# Patient Record
Sex: Female | Born: 1955
Health system: Southern US, Community
[De-identification: ages and names within clinical notes are randomized; demographics above are authoritative.]

## PROBLEM LIST (undated history)

## (undated) DIAGNOSIS — J329 Chronic sinusitis, unspecified: Secondary | ICD-10-CM

## (undated) DIAGNOSIS — H47321 Drusen of optic disc, right eye: Secondary | ICD-10-CM

## (undated) DIAGNOSIS — H2511 Age-related nuclear cataract, right eye: Secondary | ICD-10-CM

## (undated) DIAGNOSIS — M419 Scoliosis, unspecified: Secondary | ICD-10-CM

## (undated) DIAGNOSIS — E669 Obesity, unspecified: Secondary | ICD-10-CM

## (undated) DIAGNOSIS — Z8774 Personal history of (corrected) congenital malformations of heart and circulatory system: Secondary | ICD-10-CM

## (undated) DIAGNOSIS — K219 Gastro-esophageal reflux disease without esophagitis: Secondary | ICD-10-CM

## (undated) DIAGNOSIS — E039 Hypothyroidism, unspecified: Secondary | ICD-10-CM

## (undated) DIAGNOSIS — H2702 Aphakia, left eye: Secondary | ICD-10-CM

## (undated) DIAGNOSIS — N814 Uterovaginal prolapse, unspecified: Secondary | ICD-10-CM

## (undated) DIAGNOSIS — I350 Nonrheumatic aortic (valve) stenosis: Secondary | ICD-10-CM

## (undated) DIAGNOSIS — H5440 Blindness, one eye, unspecified eye: Secondary | ICD-10-CM

## (undated) DIAGNOSIS — Z952 Presence of prosthetic heart valve: Secondary | ICD-10-CM

## (undated) DIAGNOSIS — Q231 Congenital insufficiency of aortic valve: Secondary | ICD-10-CM

## (undated) DIAGNOSIS — J309 Allergic rhinitis, unspecified: Secondary | ICD-10-CM

## (undated) DIAGNOSIS — M549 Dorsalgia, unspecified: Secondary | ICD-10-CM

## (undated) DIAGNOSIS — Q2381 Bicuspid aortic valve: Secondary | ICD-10-CM

## (undated) DIAGNOSIS — I10 Essential (primary) hypertension: Secondary | ICD-10-CM

## (undated) DIAGNOSIS — R609 Edema, unspecified: Secondary | ICD-10-CM

## (undated) DIAGNOSIS — G8929 Other chronic pain: Secondary | ICD-10-CM

## (undated) DIAGNOSIS — H905 Unspecified sensorineural hearing loss: Secondary | ICD-10-CM

## (undated) DIAGNOSIS — K259 Gastric ulcer, unspecified as acute or chronic, without hemorrhage or perforation: Secondary | ICD-10-CM

## (undated) DIAGNOSIS — E785 Hyperlipidemia, unspecified: Secondary | ICD-10-CM

## (undated) DIAGNOSIS — H5462 Unqualified visual loss, left eye, normal vision right eye: Secondary | ICD-10-CM

## (undated) DIAGNOSIS — F419 Anxiety disorder, unspecified: Secondary | ICD-10-CM

## (undated) DIAGNOSIS — J3089 Other allergic rhinitis: Secondary | ICD-10-CM

## (undated) DIAGNOSIS — H547 Unspecified visual loss: Secondary | ICD-10-CM

## (undated) HISTORY — DX: Bicuspid aortic valve: Q23.81

## (undated) HISTORY — DX: Unspecified sensorineural hearing loss: H90.5

## (undated) HISTORY — DX: Edema, unspecified: R60.9

## (undated) HISTORY — DX: Nonrheumatic aortic (valve) stenosis: I35.0

## (undated) HISTORY — DX: Unqualified visual loss, left eye, normal vision right eye: H54.62

## (undated) HISTORY — DX: Essential (primary) hypertension: I10

## (undated) HISTORY — PX: OTHER SURGICAL HISTORY: SHX169

## (undated) HISTORY — PX: COARCTATION OF AORTA REPAIR: SHX261

## (undated) HISTORY — DX: Gastric ulcer, unspecified as acute or chronic, without hemorrhage or perforation: K25.9

## (undated) HISTORY — PX: BREAST EXCISIONAL BIOPSY: SUR124

## (undated) HISTORY — DX: Anxiety disorder, unspecified: F41.9

## (undated) HISTORY — DX: Unspecified visual loss: H54.7

## (undated) HISTORY — DX: Aphakia, left eye: H27.02

## (undated) HISTORY — DX: Other chronic pain: G89.29

## (undated) HISTORY — DX: Presence of prosthetic heart valve: Z95.2

## (undated) HISTORY — DX: Dorsalgia, unspecified: M54.9

## (undated) HISTORY — DX: Personal history of (corrected) congenital malformations of heart and circulatory system: Z87.74

## (undated) HISTORY — DX: Congenital insufficiency of aortic valve: Q23.1

## (undated) HISTORY — DX: Obesity, unspecified: E66.9

## (undated) HISTORY — DX: Scoliosis, unspecified: M41.9

## (undated) HISTORY — DX: Drusen of optic disc, right eye: H47.321

## (undated) HISTORY — DX: Hypothyroidism, unspecified: E03.9

## (undated) HISTORY — DX: Chronic sinusitis, unspecified: J32.9

## (undated) HISTORY — DX: Age-related nuclear cataract, right eye: H25.11

## (undated) HISTORY — DX: Gastro-esophageal reflux disease without esophagitis: K21.9

## (undated) HISTORY — DX: Other allergic rhinitis: J30.89

## (undated) HISTORY — DX: Uterovaginal prolapse, unspecified: N81.4

## (undated) HISTORY — PX: CARDIAC CATHETERIZATION: SHX172

## (undated) HISTORY — DX: Allergic rhinitis, unspecified: J30.9

## (undated) HISTORY — DX: Hyperlipidemia, unspecified: E78.5

---
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
## 1988-03-18 DIAGNOSIS — K219 Gastro-esophageal reflux disease without esophagitis: Secondary | ICD-10-CM

## 1988-03-18 HISTORY — DX: Gastro-esophageal reflux disease without esophagitis: K21.9

## 1995-03-19 DIAGNOSIS — E785 Hyperlipidemia, unspecified: Secondary | ICD-10-CM

## 1995-03-19 DIAGNOSIS — I10 Essential (primary) hypertension: Secondary | ICD-10-CM

## 1995-03-19 HISTORY — PX: THYROID SURGERY: SHX805

## 1995-03-19 HISTORY — DX: Essential (primary) hypertension: I10

## 1995-03-19 HISTORY — DX: Hyperlipidemia, unspecified: E78.5

## 2013-03-18 DIAGNOSIS — G8929 Other chronic pain: Secondary | ICD-10-CM

## 2013-03-18 DIAGNOSIS — M549 Dorsalgia, unspecified: Secondary | ICD-10-CM

## 2013-03-18 HISTORY — DX: Other chronic pain: G89.29

## 2013-03-18 HISTORY — DX: Dorsalgia, unspecified: M54.9

## 2013-07-17 DIAGNOSIS — E669 Obesity, unspecified: Secondary | ICD-10-CM | POA: Insufficient documentation

## 2013-07-17 DIAGNOSIS — E785 Hyperlipidemia, unspecified: Secondary | ICD-10-CM

## 2013-07-17 DIAGNOSIS — K219 Gastro-esophageal reflux disease without esophagitis: Secondary | ICD-10-CM | POA: Insufficient documentation

## 2013-07-17 DIAGNOSIS — J3089 Other allergic rhinitis: Secondary | ICD-10-CM | POA: Insufficient documentation

## 2013-07-17 DIAGNOSIS — E039 Hypothyroidism, unspecified: Secondary | ICD-10-CM

## 2013-07-17 DIAGNOSIS — E66812 Obesity, class 2: Secondary | ICD-10-CM

## 2013-07-17 DIAGNOSIS — M549 Dorsalgia, unspecified: Secondary | ICD-10-CM | POA: Insufficient documentation

## 2013-07-17 DIAGNOSIS — M419 Scoliosis, unspecified: Secondary | ICD-10-CM | POA: Insufficient documentation

## 2013-07-17 DIAGNOSIS — H547 Unspecified visual loss: Secondary | ICD-10-CM | POA: Insufficient documentation

## 2013-07-17 HISTORY — DX: Hyperlipidemia, unspecified: E78.5

## 2013-07-17 HISTORY — DX: Obesity, class 2: E66.812

## 2013-07-17 HISTORY — DX: Hypothyroidism, unspecified: E03.9

## 2013-07-17 HISTORY — DX: Obesity, unspecified: E66.9

## 2013-10-19 DIAGNOSIS — H47321 Drusen of optic disc, right eye: Secondary | ICD-10-CM | POA: Insufficient documentation

## 2013-10-28 DIAGNOSIS — H905 Unspecified sensorineural hearing loss: Secondary | ICD-10-CM | POA: Insufficient documentation

## 2014-02-20 DIAGNOSIS — J320 Chronic maxillary sinusitis: Secondary | ICD-10-CM | POA: Insufficient documentation

## 2014-06-29 DIAGNOSIS — H2702 Aphakia, left eye: Secondary | ICD-10-CM

## 2014-06-29 DIAGNOSIS — H2511 Age-related nuclear cataract, right eye: Secondary | ICD-10-CM | POA: Insufficient documentation

## 2014-06-29 HISTORY — DX: Aphakia, left eye: H27.02

## 2014-09-14 DIAGNOSIS — N814 Uterovaginal prolapse, unspecified: Secondary | ICD-10-CM | POA: Insufficient documentation

## 2014-09-14 DIAGNOSIS — N812 Incomplete uterovaginal prolapse: Secondary | ICD-10-CM | POA: Insufficient documentation

## 2014-09-14 HISTORY — DX: Uterovaginal prolapse, unspecified: N81.4

## 2014-09-14 HISTORY — DX: Incomplete uterovaginal prolapse: N81.2

## 2015-03-19 HISTORY — PX: AORTIC VALVE REPLACEMENT: SHX41

## 2016-01-01 ENCOUNTER — Encounter: Payer: Self-pay | Admitting: Internal Medicine

## 2016-01-04 ENCOUNTER — Telehealth: Payer: Self-pay | Admitting: Internal Medicine

## 2016-01-04 NOTE — Telephone Encounter (Signed)
Walk In pt form-D[employer] Clearance-dropped off will give to Dr.End Friday 01/05/16

## 2016-01-12 ENCOUNTER — Encounter: Payer: Self-pay | Admitting: Internal Medicine

## 2016-01-12 ENCOUNTER — Encounter (INDEPENDENT_AMBULATORY_CARE_PROVIDER_SITE_OTHER): Payer: Self-pay

## 2016-01-12 ENCOUNTER — Ambulatory Visit (INDEPENDENT_AMBULATORY_CARE_PROVIDER_SITE_OTHER): Payer: Medicare Other | Admitting: Internal Medicine

## 2016-01-12 VITALS — BP 128/84 | HR 82 | Ht <= 58 in | Wt 179.0 lb

## 2016-01-12 DIAGNOSIS — Z8774 Personal history of (corrected) congenital malformations of heart and circulatory system: Secondary | ICD-10-CM

## 2016-01-12 DIAGNOSIS — I10 Essential (primary) hypertension: Secondary | ICD-10-CM

## 2016-01-12 DIAGNOSIS — Z952 Presence of prosthetic heart valve: Secondary | ICD-10-CM | POA: Diagnosis not present

## 2016-01-12 DIAGNOSIS — M7989 Other specified soft tissue disorders: Secondary | ICD-10-CM

## 2016-01-12 DIAGNOSIS — E7800 Pure hypercholesterolemia, unspecified: Secondary | ICD-10-CM

## 2016-01-12 DIAGNOSIS — Z8679 Personal history of other diseases of the circulatory system: Secondary | ICD-10-CM | POA: Diagnosis not present

## 2016-01-12 MED ORDER — FUROSEMIDE 20 MG PO TABS: 20.0000 mg | ORAL_TABLET | Freq: Every day | ORAL | 11 refills | Status: DC

## 2016-01-12 NOTE — Patient Instructions (Addendum)
Medication Instructions:  Your physician has recommended you make the following change in your medication:  START Lasix 20mg  daily. An Rx has been sent to your pharmacy  Labwork: Bmet today  Your physician recommends that you return for lab work in: 1-2 weeks (Bmet) can be done the same day as echo   Testing/Procedures: Your physician has requested that you have an echocardiogram. Echocardiography is a painless test that uses sound waves to create images of your heart. It provides your doctor with information about the size and shape of your heart and how well your heart's chambers and valves are working. This procedure takes approximately one hour. There are no restrictions for this procedure. (To be scheduled in 1-2 weeks)   Follow-Up: Your physician recommends that you schedule a follow-up appointment in: 3 months with Dr.End   Any Other Special Instructions Will Be Listed Below (If Applicable). [facility]  We will [facility]      If you need a refill on your cardiac medications before your next appointment, please call your pharmacy.

## 2016-01-12 NOTE — Progress Notes (Signed)
New Outpatient Visit Date: 01/12/2016  Referring Provider: None  Chief Complaint: Establish heart care in [place]  HPI:  Ashley Torres is a 60 y.o. year-old female with history of bicuspid aortic valve status post replacement in 03/2015, coarctation of the aorta status post repair as a child, hypertension, hyperlipidemia, and congenital deafness, who presents to establish cardiology care after recently moving to [place] from [place]. The history is obtained with the assistance of a sign language interpreter. The patient does not recall some of the specifics surrounding her aortic valve replacement in January. However, it appears that she had been experiencing progressive fatigue leading up to the procedure. She underwent aortic valve replacement with a bioprosthesis, noting that the surgery was much longer than expected. Her perioperative course was complicated by a GI bleed, though she has otherwise recovered well. She was participating in cardiac rehabilitation prior to her move to [place], but was unable to finish the program.  The patient notes occasional positional chest pain that she attributes to healing of her sternotomy. She also has intermittent indigestion and heartburn. She denies exertional chest pain/pressure. She has not felt short of breath. She has not noticed significant leg swelling either. She notes some stuffiness in her sinuses that she attributes to allergies, which prompts her to elevate her head at night when going to bed. In the past, she has periods palpitations, though these have not been present since her aortic valve surgery.  Records of her prior cardiovascular care are limited. However, pre-AVR left heart catheterization reportedly showed clean coronary arteries.  --------------------------------------------------------------------------------------------------  Cardiovascular History & Procedures: Cardiovascular Problems:  Congenitally  malformed aortic valve status post bioprosthetic aortic valve replacement   Coarctation of the aorta status post repair as a child  Risk Factors:  Hypertension and hyperlipidemia  Cath/PCI:  Left heart catheterization (02/27/15 at [facility]): LMCA normal. LAD normal. LCx normal. RCA normal.  CV Surgery:  Aortic valve replacement with annulus enlargement (19 mm Magna model 300 Carpentier-Edwards bovine pericardial heart valve) on 04/17/15 at [facility]  Coarctation of aorta repair (~60 years of age)  EP Procedures and Devices:  None  Non-Invasive Evaluation(s):  Transthoracic echocardiogram (02/07/15): Normal LV size with mild LVH and mild focal basal hypertrophy of the septum. LVEF 60-65%. Aortic valve with moderate annular calcification and bicuspid morphology. Valve is severely thickened and calcified with severe stenosis (mean gradient 40 mmHg, aortic valve area 0.9 cm). Mild mitral annular calcification and valvular thickening. Mild left atrial enlargement. Normal RV size and function.  Recent CV Pertinent Labs: See below.  --------------------------------------------------------------------------------------------------  Past Medical History:  Diagnosis Date  . Aphakia of left eye   . Back pain   . Chronic sinusitis   . Deafness congenital   . Drusen of right optic disc   . Environmental and seasonal allergies   . Gastric stress ulcer   . GERD (gastroesophageal reflux disease)   . Hyperlipidemia   . Hypertension   . Hypothyroidism   . Nuclear sclerosis of right eye   . Obesity   . Rubella syndrome, congenital   . S/P AVR (aortic valve replacement)   . Scoliosis   . Uterine prolapse   . Vision impairment   . Vision loss, left eye     Past Surgical History:  Procedure Laterality Date  . AORTIC VALVE REPLACEMENT    . CARDIAC CATHETERIZATION    . COARCTATION OF AORTA REPAIR  Outpatient Encounter  Prescriptions as of 01/12/2016  Medication Sig  . acetaminophen (TYLENOL) 500 MG tablet Take 1,000 mg by mouth every 6 (six) hours as needed for moderate pain.  Marland Kitchen albuterol (PROVENTIL HFA) 108 (90 Base) MCG/ACT inhaler Inhale 1 puff into the lungs every 4 (four) hours as needed for wheezing.  Marland Kitchen aspirin EC 81 MG tablet Take 81 mg by mouth daily.  . Calcium Carb-Cholecalciferol [phone] MG-UNIT CAPS Take 2 tablets by mouth daily.  . calcium carbonate (TUMS - DOSED IN MG ELEMENTAL CALCIUM) 500 MG chewable tablet Chew 1 tablet by mouth 4 (four) times daily.  . chlorpheniramine (CHLOR-TRIMETON) 4 MG tablet Take 4 mg by mouth as needed for allergies.  Marland Kitchen FOLIC ACID PO Take 1 tablet by mouth daily.  Marland Kitchen GLYCERIN-HYPROMELLOSE-PEG 400 OP Apply 1 drop to eye as needed (Dry eyes).  . hydrochlorothiazide (HYDRODIURIL) 25 MG tablet Take 25 mg by mouth daily.  Marland Kitchen levothyroxine (SYNTHROID, LEVOTHROID) 112 MCG tablet Take 112 mcg by mouth daily before breakfast.  . lisinopril (PRINIVIL,ZESTRIL) 20 MG tablet Take 20 mg by mouth daily.  . vitamin C (ASCORBIC ACID) 250 MG tablet Take 250 mg by mouth daily.  . Vitamin E 100 units TABS Take 1 tablet by mouth daily.  . Zinc 50 MG TABS Take 1 tablet by mouth daily.  . [DISCONTINUED] amoxicillin-clavulanate (AUGMENTIN) 875-125 MG tablet Take 1 tablet by mouth 2 (two) times daily. FOR 110 DAYS  . [DISCONTINUED] atorvastatin (LIPITOR) 40 MG tablet Take 40 mg by mouth daily.  . [DISCONTINUED] cetirizine (ZYRTEC) 10 MG tablet Take 10 mg by mouth daily.  . [DISCONTINUED] metoprolol succinate (TOPROL-XL) 50 MG 24 hr tablet Take 50 mg by mouth daily. Take with or immediately following a meal.  . [DISCONTINUED] omeprazole (PRILOSEC) 40 MG capsule Take 40 mg by mouth 2 (two) times daily.   No facility-administered encounter medications on file as of 01/12/2016.     Allergies: Statins  Social History   Social History  . Marital status: Married    Spouse name: N/A  .  Number of children: N/A  . Years of education: N/A   Occupational History  . Not on file.   Social History Main Topics  . Smoking status: Never Smoker  . Smokeless tobacco: Never Used  . Alcohol use No  . Drug use: No  . Sexual activity: Not on file   Other Topics Concern  . Not on file   Social History Narrative  . No narrative on file    Family History  Problem Relation Age of Onset  . Heart disease Neg Hx     Review of Systems: Review of Systems  Constitutional: Negative.   HENT: Positive for hearing loss (deaf since birth).   Eyes: Negative.        Blind in one eye since birth.  Respiratory: Positive for cough.   Gastrointestinal: Negative.   Genitourinary: Negative.   Musculoskeletal: Positive for back pain (Scoliosis) and myalgias (When walking).  Skin: Negative.   Neurological: Positive for headaches.  Endo/Heme/Allergies: Bruises/bleeds easily.  Psychiatric/Behavioral: The patient is nervous/anxious (Rare).    --------------------------------------------------------------------------------------------------  Physical Exam: BP 128/84   Pulse 82   Ht 4' 8.5" (1.435 m)   Wt 179 lb (81.2 kg)   LMP  (LMP Unknown)   BMI 39.42 kg/m   General:  Obese woman, seated comfortably in the exam room. She is accompanied by a sign language interpreter. HEENT: No conjunctival pallor or scleral icterus.  Moist mucous  membranes.  OP clear. Neck: Supple without lymphadenopathy or thyromegaly. JVP is approximately 10-12 cm with positive HJR. No carotid bruit. Lungs: Normal work of breathing.  Mildly diminished breath sounds at both bases without wheezes or crackles.. Heart: Regular rate and rhythm without murmurs, rubs, or gallops.  Non-displaced PMI. Abd: Bowel sounds present.  Soft, NT/ND without hepatosplenomegaly Ext: No lower extremity edema.  Radial, PT, and DP pulses are 2+ bilaterally Skin: warm and dry without rash Neuro: CNIII-XII intact.  Strength and  fine-touch sensation intact in upper and lower extremities bilaterally. Psych: Normal mood and affect.  EKG:  Normal sinus rhythm with right bundle branch block.  Outside labs: BMP (10/03/15): Sodium 142, potassium 4.3, chloride 102, CO2 29, BUN 27, creatinine 0.75, glucose 82, calcium 9.9  CBC (05/18/15): But blood cell count 10.7, hemoglobin 10.9, hematocrit 33.8, platelets 308  Lipid panel (10/03/15): Total cholesterol 245, triglycerides 162, HDL 51, LDL 162  --------------------------------------------------------------------------------------------------  ASSESSMENT AND PLAN: 1. Congenitally malformed aortic valve status post aortic valve repair The patient is now 10 months out from her bioprosthetic aortic valve repair. She does not have any symptoms of valvular incompetence, though she does appear modestly volume overloaded on exam today. I encouraged her to continue taking low-dose aspirin. We will perform a transthoracic echocardiogram for further evaluation.  2. Leg swelling The patient denies significant leg swelling, though significant edema is evident on exam today and both legs. She also has elevated JVP suggesting increased right heart pressures. We have agreed to start furosemide 20 mg daily. We will check a basic metabolic panel today to ensure her renal function and electrolytes are normal, and repeat this when she returns for her echo in 1 to 2 weeks. As above, we will perform a transthoracic echocardiogram for further evaluation of potential structural heart problems leading to her leg swelling.  3. Hypertension and history of coarctation of the aorta The patient's blood pressure is well controlled today. We will continue her current regimen. She has good pedal pulses on exam today, making it unlikely that she has significant stenosis in the region of her repaired coarctation. Hopefully, the transthoracic echocardiogram will also allow us to evaluate for evidence of any  significant stenosis involving the descending aorta.  4. Hyperlipidemia Recent lipid panel in [place] notable for an LDL of 162 with normal HDL. Patient has been intolerant of statins in the past. We will have her reenroll in cardiac rehabilitation, she did not complete the entire program following AVR, and encourage lifestyle modification.  Follow-up: Return to clinic in 3 months.  Ashley Kendallhristopher Navina Wohlers, MD 01/12/2016 2:45 PM

## 2016-01-13 DIAGNOSIS — I10 Essential (primary) hypertension: Secondary | ICD-10-CM | POA: Insufficient documentation

## 2016-01-13 DIAGNOSIS — E785 Hyperlipidemia, unspecified: Secondary | ICD-10-CM | POA: Insufficient documentation

## 2016-01-13 DIAGNOSIS — Z8774 Personal history of (corrected) congenital malformations of heart and circulatory system: Secondary | ICD-10-CM

## 2016-01-13 DIAGNOSIS — Z952 Presence of prosthetic heart valve: Secondary | ICD-10-CM

## 2016-01-13 DIAGNOSIS — M7989 Other specified soft tissue disorders: Secondary | ICD-10-CM | POA: Insufficient documentation

## 2016-01-13 HISTORY — DX: Presence of prosthetic heart valve: Z95.2

## 2016-01-13 HISTORY — DX: Essential (primary) hypertension: I10

## 2016-01-13 HISTORY — DX: Personal history of (corrected) congenital malformations of heart and circulatory system: Z87.74

## 2016-01-13 LAB — BASIC METABOLIC PANEL
BUN: 25 mg/dL (ref 7–25)
CHLORIDE: 103 mmol/L (ref 98–110)
CO2: 20 mmol/L (ref 20–31)
Calcium: 9.8 mg/dL (ref 8.6–10.4)
Creat: 0.9 mg/dL (ref 0.50–1.05)
GLUCOSE: 77 mg/dL (ref 65–99)
POTASSIUM: 4.8 mmol/L (ref 3.5–5.3)
Sodium: 138 mmol/L (ref 135–146)

## 2016-01-22 ENCOUNTER — Other Ambulatory Visit: Payer: Self-pay | Admitting: Internal Medicine

## 2016-01-22 DIAGNOSIS — Z1231 Encounter for screening mammogram for malignant neoplasm of breast: Secondary | ICD-10-CM

## 2016-01-30 ENCOUNTER — Telehealth: Payer: Self-pay | Admitting: Internal Medicine

## 2016-01-30 NOTE — Telephone Encounter (Deleted)
error 

## 2016-02-02 ENCOUNTER — Telehealth: Payer: Self-pay | Admitting: Internal Medicine

## 2016-02-02 ENCOUNTER — Other Ambulatory Visit: Payer: Self-pay

## 2016-02-02 ENCOUNTER — Other Ambulatory Visit: Payer: Medicare Other | Admitting: *Deleted

## 2016-02-02 ENCOUNTER — Ambulatory Visit (HOSPITAL_COMMUNITY): Payer: Medicare Other | Attending: Internal Medicine

## 2016-02-02 DIAGNOSIS — I371 Nonrheumatic pulmonary valve insufficiency: Secondary | ICD-10-CM | POA: Insufficient documentation

## 2016-02-02 DIAGNOSIS — M7989 Other specified soft tissue disorders: Secondary | ICD-10-CM | POA: Insufficient documentation

## 2016-02-02 DIAGNOSIS — I071 Rheumatic tricuspid insufficiency: Secondary | ICD-10-CM | POA: Insufficient documentation

## 2016-02-02 DIAGNOSIS — E785 Hyperlipidemia, unspecified: Secondary | ICD-10-CM | POA: Insufficient documentation

## 2016-02-02 DIAGNOSIS — I10 Essential (primary) hypertension: Secondary | ICD-10-CM | POA: Diagnosis not present

## 2016-02-02 DIAGNOSIS — E669 Obesity, unspecified: Secondary | ICD-10-CM | POA: Insufficient documentation

## 2016-02-02 DIAGNOSIS — Z952 Presence of prosthetic heart valve: Secondary | ICD-10-CM | POA: Diagnosis present

## 2016-02-05 ENCOUNTER — Telehealth: Payer: Self-pay | Admitting: Internal Medicine

## 2016-02-05 NOTE — Telephone Encounter (Signed)
[facility] [place] in [facility] Prep. Patient has Appointment on 04/19/16 with Cristal Deerhristopher End,MD

## 2016-02-05 NOTE — Telephone Encounter (Signed)
Please let Ms. Navejas know that I am not familiar with GRS Ultra cell defense and cannot recommend this medication.  However, based on our recent visit, I feel that furosemide would be beneficial for her.  Thanks.

## 2016-02-05 NOTE — Telephone Encounter (Signed)
Message left through interpreter [phone] for pt to call back.

## 2016-02-05 NOTE — Telephone Encounter (Signed)
I spoke with patient through interpreter [phone]. Pt given Dr Serita KyleEnd's recommendation about starting lasix.  Pt states she is unsure when she will be able to get lasix prescription from the pharmacy. Pt is aware that she needs BMET 1-2 weeks after she starts lasix.  I offered to schedule follow up lab for 02/15/16 but pt states she is unable to talk at this time.  I asked pt to call when she started lasix so we could make arrangements for follow up BMET in 1-2 weeks.  Pt also given Dr Serita KyleEnd's comments about GRS Ultra cell defense OTC.

## 2016-02-05 NOTE — Telephone Encounter (Signed)
I will forward to Dr End for review.  

## 2016-02-05 NOTE — Telephone Encounter (Signed)
Mrs. Joneen BoersDepoorter is returning a call .  Thanks

## 2016-02-05 NOTE — Telephone Encounter (Signed)
-----   Message from FernleyGlenna L Chriscoe sent at 02/02/2016  2:42 PM EST ----- Regarding: lab work Pt came in for labs and Echo.  She stated she did not start taking Lasix because of the health risk.  After speaking to LenoxKeith, he stated she did not need lab since she did not start this medication.  Please let Dr. Okey DupreEnd know this and the patient wants to know if it is safe for her to take GRS Ultra cell defense OTC.  Pt is having her Echo as scheduled.

## 2016-02-06 ENCOUNTER — Telehealth: Payer: Self-pay | Admitting: Internal Medicine

## 2016-02-06 ENCOUNTER — Other Ambulatory Visit: Payer: Self-pay | Admitting: *Deleted

## 2016-02-06 MED ORDER — AMOXICILLIN 500 MG PO TABS: ORAL_TABLET | ORAL | 6 refills | Status: DC

## 2016-02-06 NOTE — Telephone Encounter (Signed)
Spoke with pt through interpreter [phone], confirmed pt is not allergic to amoxicillin.

## 2016-02-06 NOTE — Telephone Encounter (Signed)
New message ° °Pt is returning call  ° °Please call back °

## 2016-02-22 ENCOUNTER — Encounter: Payer: Self-pay | Admitting: Family Medicine

## 2016-02-23 ENCOUNTER — Ambulatory Visit
Admission: RE | Admit: 2016-02-23 | Discharge: 2016-02-23 | Disposition: A | Discharge status: Home / Self Care | Payer: Medicare Other | Source: Ambulatory Visit | Attending: Internal Medicine | Admitting: Internal Medicine

## 2016-02-23 ENCOUNTER — Encounter: Payer: Self-pay | Admitting: Radiology

## 2016-02-23 DIAGNOSIS — Z1231 Encounter for screening mammogram for malignant neoplasm of breast: Secondary | ICD-10-CM

## 2016-02-23 IMAGING — MG 2D DIGITAL SCREENING BILATERAL MAMMOGRAM WITH CAD AND ADJUNCT TO
8 of 12 series · 8 of 28 positions shown · non-contrast
Comparison: Previous exam(s).

CLINICAL DATA: Screening.

EXAM:
2D DIGITAL SCREENING BILATERAL MAMMOGRAM WITH CAD AND ADJUNCT TOMO

[R CC synth-2D]
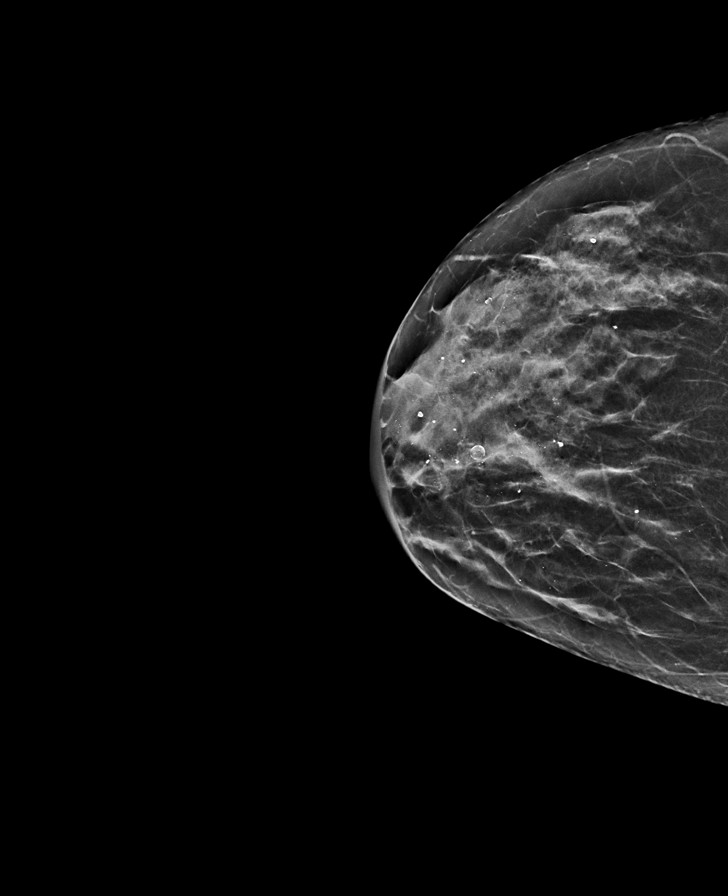

[R MLO synth-2D]
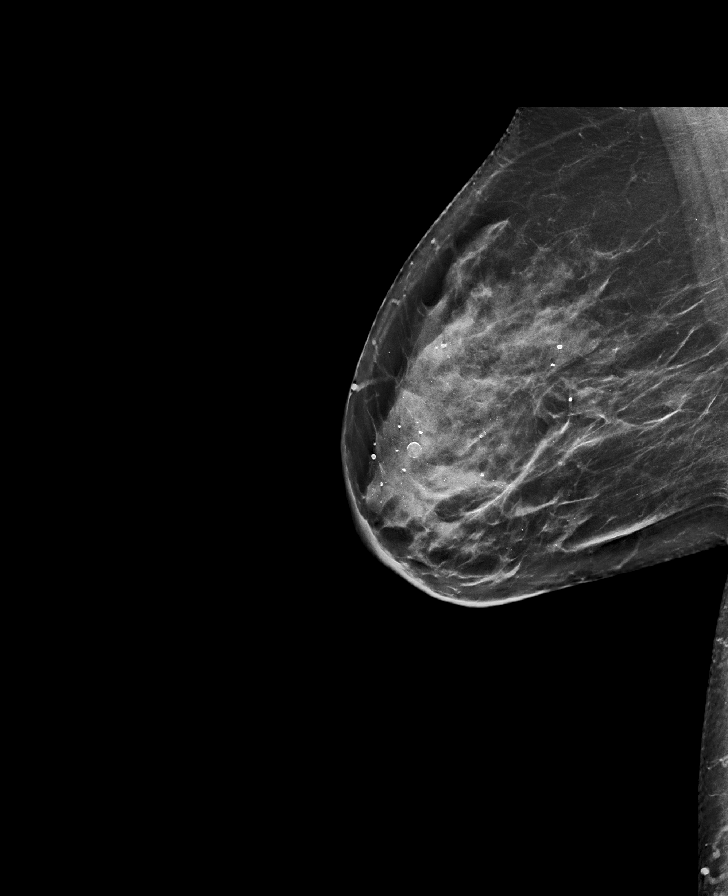

[R CC]
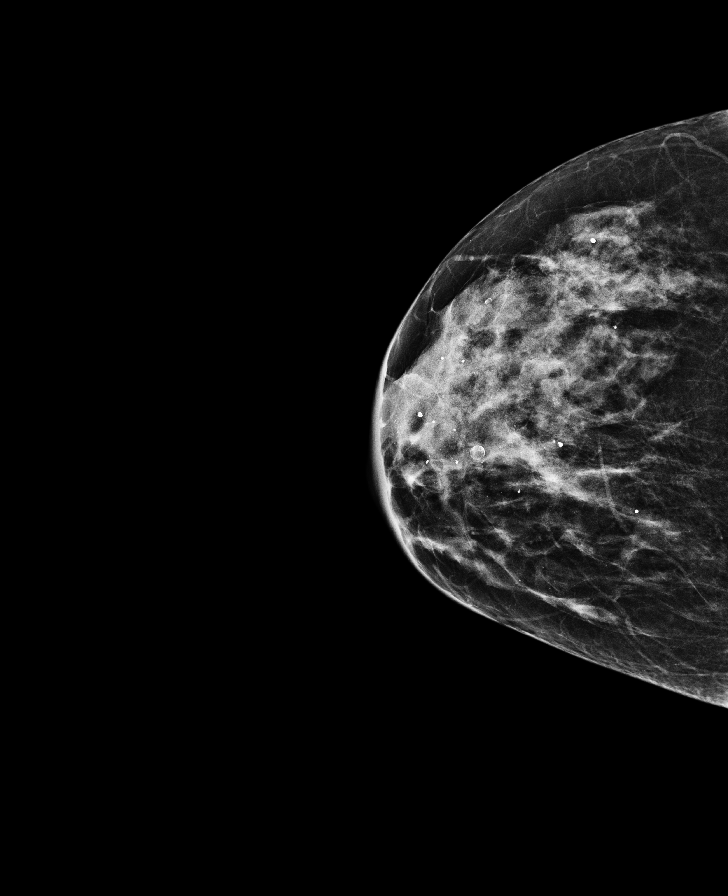

[L CC synth-2D]
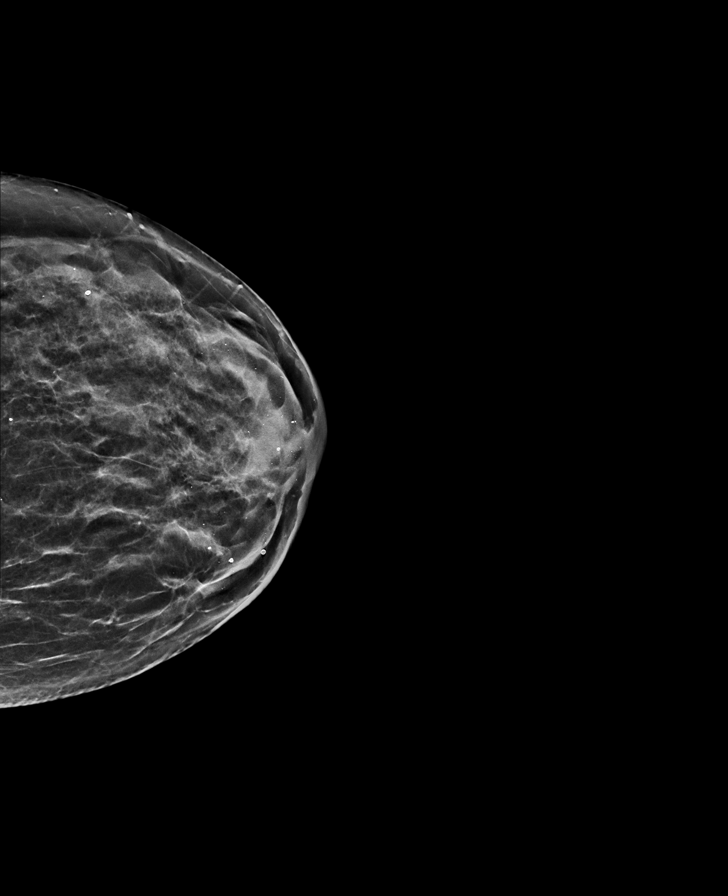

[L MLO synth-2D]
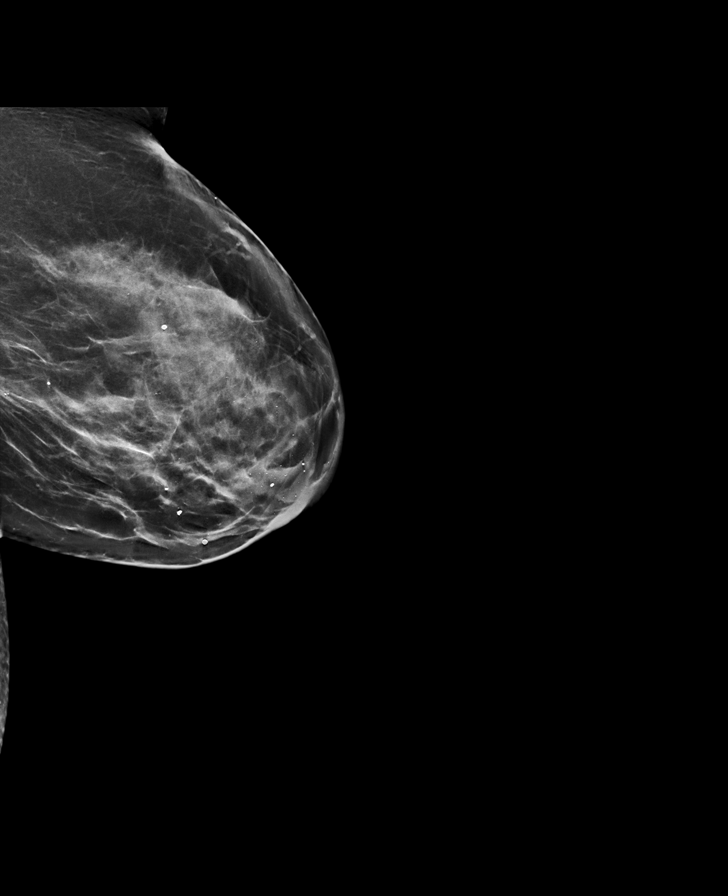

[L MLO]
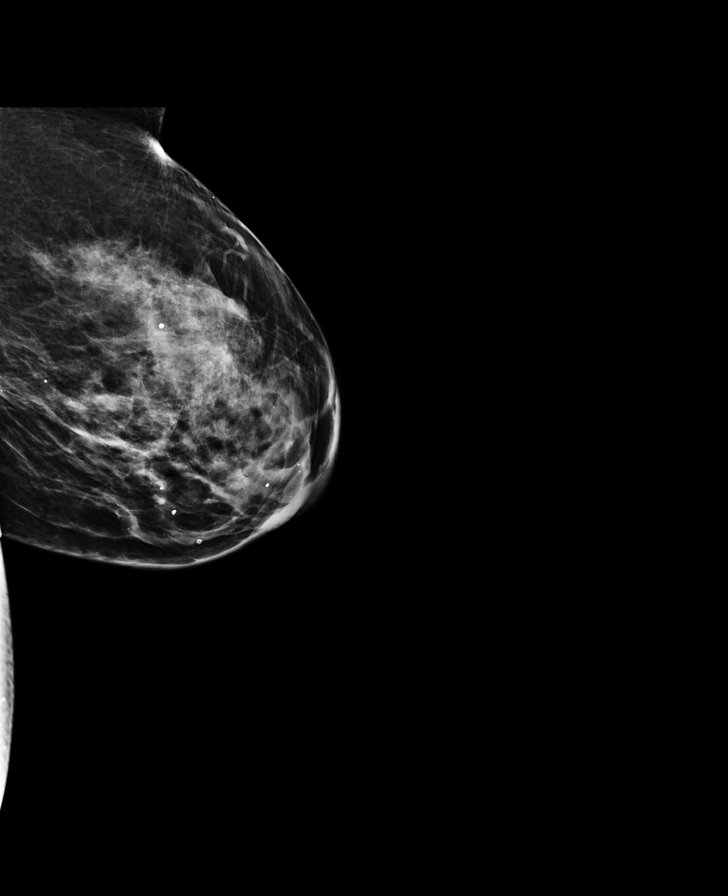

[R MLO]
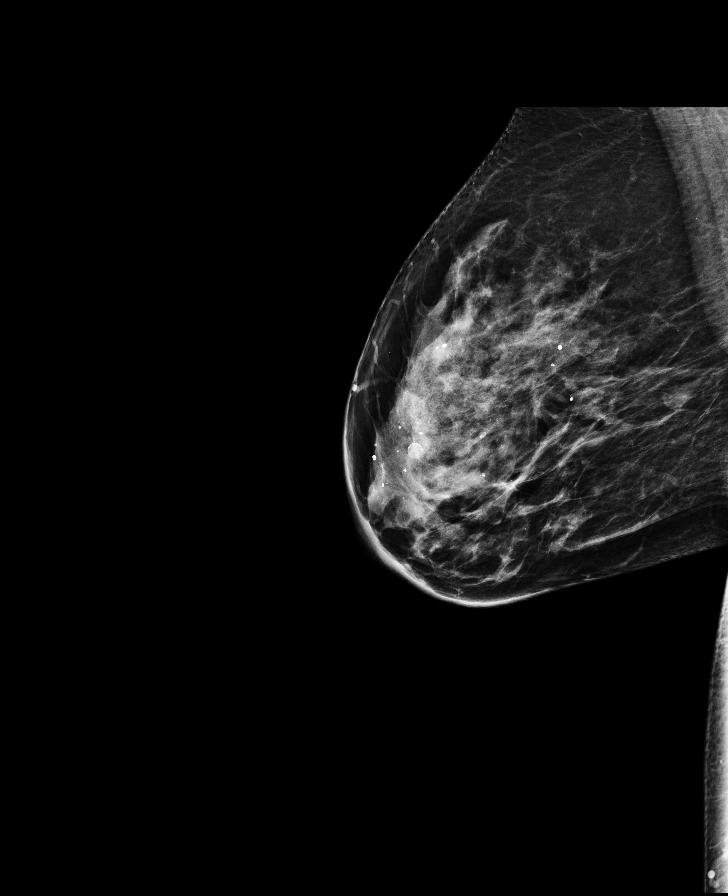

[L CC]
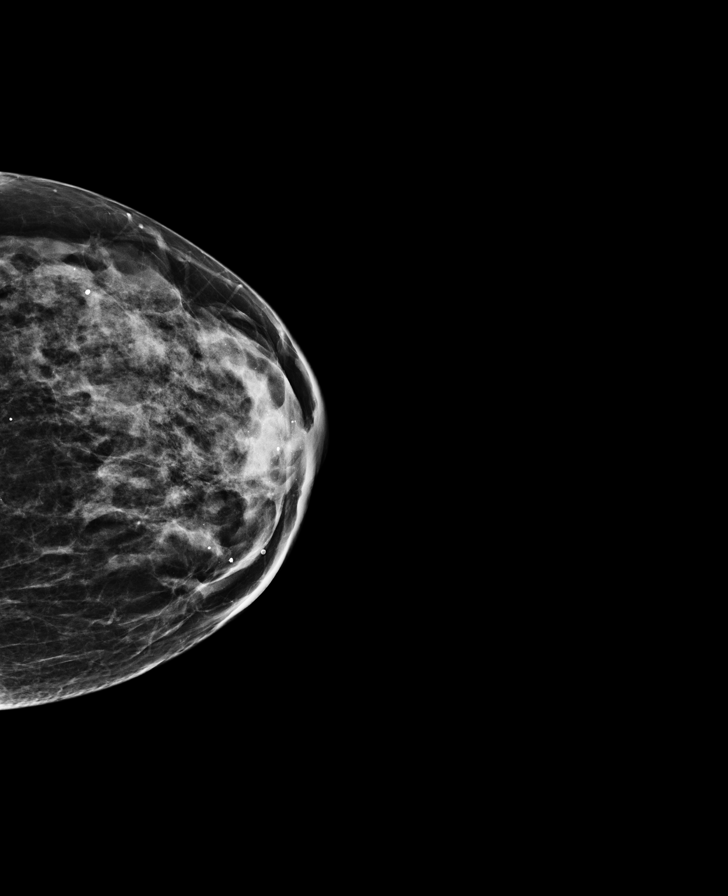

[8 of 28 positions shown; findings below may reference images not displayed]

ACR Breast Density Category c: The breast tissue is heterogeneously
dense, which may obscure small masses.
FINDINGS: In the right breast, possible distortion warrants further
evaluation. In the left breast, no findings suspicious for
malignancy. Images were processed with CAD.
IMPRESSION: Further evaluation is suggested for possible distortion in the right
breast.

RECOMMENDATION:
Diagnostic mammogram and possibly ultrasound of the right breast.
(Code:[58])

The patient will be contacted regarding the findings, and additional
imaging will be scheduled.

BI-RADS CATEGORY  0: Incomplete. Need additional imaging evaluation
and/or prior mammograms for comparison.

## 2016-03-05 ENCOUNTER — Other Ambulatory Visit: Payer: Self-pay | Admitting: Internal Medicine

## 2016-03-05 DIAGNOSIS — R928 Other abnormal and inconclusive findings on diagnostic imaging of breast: Secondary | ICD-10-CM

## 2016-03-14 ENCOUNTER — Encounter: Payer: Self-pay | Admitting: Family Medicine

## 2016-03-14 ENCOUNTER — Ambulatory Visit (INDEPENDENT_AMBULATORY_CARE_PROVIDER_SITE_OTHER): Payer: Medicare Other | Admitting: Family Medicine

## 2016-03-14 ENCOUNTER — Other Ambulatory Visit: Payer: Self-pay | Admitting: Family Medicine

## 2016-03-14 VITALS — BP 116/62 | HR 88 | Temp 97.8°F | Ht <= 58 in | Wt 182.4 lb

## 2016-03-14 DIAGNOSIS — J3089 Other allergic rhinitis: Secondary | ICD-10-CM | POA: Diagnosis present

## 2016-03-14 DIAGNOSIS — R922 Inconclusive mammogram: Secondary | ICD-10-CM

## 2016-03-14 DIAGNOSIS — E785 Hyperlipidemia, unspecified: Secondary | ICD-10-CM

## 2016-03-14 DIAGNOSIS — I10 Essential (primary) hypertension: Secondary | ICD-10-CM | POA: Diagnosis not present

## 2016-03-14 DIAGNOSIS — E039 Hypothyroidism, unspecified: Secondary | ICD-10-CM

## 2016-03-14 DIAGNOSIS — Z952 Presence of prosthetic heart valve: Secondary | ICD-10-CM | POA: Diagnosis not present

## 2016-03-14 LAB — TSH: TSH: 0.87 m[IU]/L

## 2016-03-14 MED ORDER — LEVOTHYROXINE SODIUM 112 MCG PO TABS: 112.0000 ug | ORAL_TABLET | Freq: Every day | ORAL | 3 refills | Status: DC

## 2016-03-14 MED ORDER — ALBUTEROL SULFATE HFA 108 (90 BASE) MCG/ACT IN AERS: 1.0000 | INHALATION_SPRAY | RESPIRATORY_TRACT | 2 refills | Status: DC | PRN

## 2016-03-14 MED ORDER — RED YEAST RICE 600 MG PO CAPS: 1.0000 | ORAL_CAPSULE | Freq: Every day | ORAL | Status: DC

## 2016-03-14 MED ORDER — HYDROCHLOROTHIAZIDE 25 MG PO TABS: 25.0000 mg | ORAL_TABLET | Freq: Every day | ORAL | 3 refills | Status: DC

## 2016-03-14 MED ORDER — LISINOPRIL 20 MG PO TABS: 20.0000 mg | ORAL_TABLET | Freq: Every day | ORAL | 3 refills | Status: DC

## 2016-03-14 NOTE — Assessment & Plan Note (Signed)
Continue red yeast rice. LDL in July 2017 was 162. Discussed healthy diet today and will recheck in 6 months.

## 2016-03-14 NOTE — Assessment & Plan Note (Signed)
Continue rhinocort nasal spray and OTC chlorpheniramine. Of note, patient has h/o chronic sinusitis so managing environmental triggers may be important for her.

## 2016-03-14 NOTE — Assessment & Plan Note (Signed)
Followed by cardiology Dr. Okey DupreEnd, has signs of fluid overload but since opted to not start lasix and refuses compression stockings continue current management per cardiology. Appears stable today.

## 2016-03-14 NOTE — Patient Instructions (Addendum)
It was good to meet you today  For your hypothyroidism - Please continue taking your thyroid medication - We need to check a TSH level today to monitor how your thyroid is working.  We are checking some labs today, and someone will call you or send you a letter with the results when they are available.   Take care and seek immediate care sooner if you develop any concerns.   Dr. Leland HerElsia J Lavine Hargrove, DO [facility]

## 2016-03-14 NOTE — Assessment & Plan Note (Signed)
Continue HCTZ and lisinopril since patient is at goal. No need to recheck BMP at this time since patient did not start lasix.

## 2016-03-14 NOTE — Progress Notes (Signed)
    Subjective:  Toniann FailDawn Yeargan is a 60 y.o. female who [facility] to establish as new patient.  HPI: [facility], recently moved to area. History taken via sign language interpreter.  H/o biscuspid aortic valve s/p replacement 03/2015 and coarctation of aorta s/p repair in childhood - Established with cardiologist Dr. Okey DupreEnd who is managing her care.  - Per chart review, had recommended lasix 20mg  for fluid overload which patient declined since echo on 02/02/16 showed bioprosthetic valve was functioning well. Had also recommended compression stockings which patient states has not being wearing because it makes her itch. - States chronically has LE edema that is at her baseline - Denies CP, SOB.  Environmental allergies and seasonal allergies - Has been having nasal congestion and sinus pressure with some post nasal drip in the mornings intermittently since August 2017. Has noticed is worse in her apartment because of second hand smoke exposure from neighbors and better throughout the day. - Is taking OTC chlorpheniramine and rhinocort nasal spray with some relief and feels like is at her baseline.  - Denies fever/chills, cough, ear pain  HTN - States is compliant on HCTZ and lisinopril, tolerating medications well - Denies HA, lightheadedness, dizziness  HLD - Per records in care everywhere, has refused statins in the past due to muscle soreness. Last cholesterol panel 10/03/15, LDL 162 - Is taking OTC red yeast rice. Today also states she will never try statins again.  Health maintenance - Per chart review had screening mammogram on 02/23/16 ordered by cardiologist that was incomplete due to possible distortion in R breast. Has ultrasound scheduled at breast center tomorrow at 2:30pm and while additional imaging was initially ordered under Dr. Okey DupreEnd, breast center needs them to be ordered by PCP.  - Declined flu shot today  Objective:  Physical  Exam: BP 116/62   Pulse 88   Temp 97.8 F (36.6 C) (Oral)   Ht 4\' 9"  (1.448 m)   Wt 182 lb 6.4 oz (82.7 kg)   LMP  (LMP Unknown)   SpO2 96%   BMI 39.47 kg/m   Gen: NAD, resting comfortably HEENT: [place], AT. PERRL, conjunctiva clear. MMM, good dentition, oropharynx nonerythematous Neck: supple, normal ROM. No cervical lymphadenopathy CV: RRR with no murmurs appreciated Pulm: NWOB, CTAB with no crackles, wheezes, or rhonchi GI: Normal bowel sounds present. Soft, Nontender, Nondistended. Extremities: 1+ pitting edema to midshin Skin: warm, dry Psych: Normal affect and thought content   Assessment/Plan:  HTN (hypertension) Continue HCTZ and lisinopril since patient is at goal. No need to recheck BMP at this time since patient did not start lasix.  S/P AVR (aortic valve replacement) Followed by cardiology Dr. Okey DupreEnd, has signs of fluid overload but since opted to not start lasix and refuses compression stockings continue current management per cardiology. Appears stable today.  Hyperlipidemia Continue red yeast rice. LDL in July 2017 was 162. Discussed healthy diet today and will recheck in 6 months.  Environmental and seasonal allergies Continue rhinocort nasal spray and OTC chlorpheniramine. Of note, patient has h/o chronic sinusitis so managing environmental triggers may be important for her.  Health maintenance Since had screening mammogram on 02/23/16 that was incomplete, ordered follow up testing that was recommended by phone by the Breast Center. Patient declined flu shot today.  Leland HerElsia J Tacy Chavis, DO PGY-1, [facility] 03/14/2016 2:01 PM

## 2016-03-15 ENCOUNTER — Other Ambulatory Visit: Payer: Medicare Other

## 2016-03-21 ENCOUNTER — Encounter: Payer: Self-pay | Admitting: Family Medicine

## 2016-04-09 ENCOUNTER — Ambulatory Visit
Admission: RE | Admit: 2016-04-09 | Discharge: 2016-04-09 | Disposition: A | Discharge status: Home / Self Care | Payer: Medicare Other | Source: Ambulatory Visit | Attending: Family Medicine | Admitting: Family Medicine

## 2016-04-09 DIAGNOSIS — R922 Inconclusive mammogram: Secondary | ICD-10-CM

## 2016-04-19 ENCOUNTER — Ambulatory Visit (INDEPENDENT_AMBULATORY_CARE_PROVIDER_SITE_OTHER): Payer: Medicare Other | Admitting: Internal Medicine

## 2016-04-19 ENCOUNTER — Encounter: Payer: Self-pay | Admitting: Internal Medicine

## 2016-04-19 ENCOUNTER — Encounter (INDEPENDENT_AMBULATORY_CARE_PROVIDER_SITE_OTHER): Payer: Self-pay

## 2016-04-19 VITALS — BP 124/64 | HR 87 | Ht <= 58 in | Wt 181.0 lb

## 2016-04-19 DIAGNOSIS — Q231 Congenital insufficiency of aortic valve: Secondary | ICD-10-CM

## 2016-04-19 DIAGNOSIS — R600 Localized edema: Secondary | ICD-10-CM

## 2016-04-19 DIAGNOSIS — M545 Low back pain, unspecified: Secondary | ICD-10-CM

## 2016-04-19 DIAGNOSIS — Z952 Presence of prosthetic heart valve: Secondary | ICD-10-CM | POA: Diagnosis not present

## 2016-04-19 DIAGNOSIS — E785 Hyperlipidemia, unspecified: Secondary | ICD-10-CM

## 2016-04-19 DIAGNOSIS — Q251 Coarctation of aorta: Secondary | ICD-10-CM | POA: Diagnosis not present

## 2016-04-19 NOTE — Patient Instructions (Signed)
Medication Instructions:  Your physician recommends that you continue on your current medications as directed. Please refer to the Current Medication list given to you today.   Labwork: None   Testing/Procedures: None   Follow-Up: Your physician wants you to follow-up in: 6 months with Dr End. (August 2018). You will receive a reminder letter in the mail two months in advance. If you don't receive a letter, please call our office to schedule the follow-up appointment.        If you need a refill on your cardiac medications before your next appointment, please call your pharmacy.   

## 2016-04-19 NOTE — Progress Notes (Signed)
Follow-up Outpatient Visit Date: 04/19/2016  Primary Care Provider: Leland HerElsia J Yoo, DO [address] [place] [address]  Chief Complaint: Follow-up aortic valve replacement  HPI:  Ashley Torres is a 61 y.o. year-old female with history of bicuspid aortic valve status post bioprosthetic aortic valve replacement in 03/2015, coarctation of the aorta status post repair as a child, hypertension, hyperlipidemia, and congenital deafness, who presents for follow-up of her aortic valve disease and coarctation of the aorta. I last saw the patient on 01/12/16, which time she was doing well. We noted significant lower extremity edema and mild JVD, for which I recommended compression stockings and furosemide. The patient has been wearing compression sleeves in addition to her socks extending to the mid calf, though she notes that these frequently slide down to her ankles. She was hesitant to start furosemide due to GI side effects in the past. After reviewing her echocardiogram performed since her last visit, we agreed to defer diuretic therapy.  Today, the patient reports feeling well. She notes occasional shortness of breath, especially when lying down at night, which resolves promptly with her albuterol inhaler. She denies chest pain, exertional dyspnea, palpitations, and lightheadedness. She continues to have some swelling in her legs, though she is not bothered by it. Her weight has been stable. She is planning to undergo extensive dental cleaning in the next few weeks and has prophylactic antibiotics on hand to be taken one hour before the procedure.  Ashley Torres's only complaint today is of intermittent right flank and low back pain. This has been present for a few days and seems to be sure. She denies dysuria, foul smelling urine, hematuria, and fevers. She urinates often, though this is not different than her  baseline.  --------------------------------------------------------------------------------------------------  Cardiovascular History & Procedures: Cardiovascular Problems:  Congenitally malformed aortic valve status post bioprosthetic aortic valve replacement   Coarctation of the aorta status post repair as a child  Risk Factors:  Hypertension and hyperlipidemia  Cath/PCI:  [facility] LMCA normal. LAD normal. LCx normal. RCA normal.  CV Surgery:  Aortic valve replacement with annulus enlargement (19 mm Magna model 300 Carpentier-Edwards bovine pericardial heart valve) [facility]  Coarctation of aorta repair (~61 years of age)  EP Procedures and Devices:  None  Non-Invasive Evaluation(s):  Transthoracic echocardiogram (02/06/16): Normal LV size with mild focal basal hypertrophy.  LVEF 60-65% with grade 1 diastolic dysfunction and elevated LV filling pressure. Bioprosthetic aortic valve well-seated with peak transvalvular velocity of 3.5 m/s; mean gradient 25 mmHg.  Mitral annular calcification present.  Moderate left atrial enlargement.  Normal RV size and function.  Trivial TR.  Normal pulmonary artery pressure.  Transthoracic echocardiogram (02/07/15): Normal LV size with mild LVH and mild focal basal hypertrophy of the septum. LVEF 60-65%. Aortic valve with moderate annular calcification and bicuspid morphology. Valve is severely thickened and calcified with severe stenosis (mean gradient 40 mmHg, aortic valve area 0.9 cm). Mild mitral annular calcification and valvular thickening. Mild left atrial enlargement. Normal RV size and function.  Recent CV Pertinent Labs: Lab Results  Component Value Date   K 4.8 01/12/2016   BUN 25 01/12/2016   CREATININE 0.90 01/12/2016    Past medical and surgical history were reviewed and updated in EPIC.  Outpatient Encounter  Prescriptions as of 04/19/2016  Medication Sig  . acetaminophen (TYLENOL) 500 MG tablet Take 1,000 mg by mouth every 6 (  six) hours as needed for moderate pain.  Marland Kitchen albuterol (PROVENTIL HFA) 108 (90 Base) MCG/ACT inhaler Inhale 1 puff into the lungs every 4 (four) hours as needed for wheezing.  Marland Kitchen aspirin EC 81 MG tablet Take 81 mg by mouth daily.  . budesonide (RHINOCORT AQUA) 32 MCG/ACT nasal spray Place 1 spray into both nostrils daily as needed.  . Calcium Carb-Cholecalciferol [phone] MG-UNIT CAPS Take 2 tablets by mouth daily.  . calcium carbonate (TUMS - DOSED IN MG ELEMENTAL CALCIUM) 500 MG chewable tablet Chew 1 tablet by mouth 4 (four) times daily.  . chlorpheniramine (CHLOR-TRIMETON) 4 MG tablet Take 4 mg by mouth as needed for allergies.  Marland Kitchen FOLIC ACID PO Take 1 tablet by mouth daily.  Marland Kitchen GLYCERIN-HYPROMELLOSE-PEG 400 OP Apply 1 drop to eye as needed (Dry eyes).  . hydrochlorothiazide (HYDRODIURIL) 25 MG tablet Take 1 tablet (25 mg total) by mouth daily.  Marland Kitchen levothyroxine (SYNTHROID, LEVOTHROID) 112 MCG tablet Take 1 tablet (112 mcg total) by mouth daily before breakfast.  . lisinopril (PRINIVIL,ZESTRIL) 20 MG tablet Take 1 tablet (20 mg total) by mouth daily.  . Red Yeast Rice 600 MG CAPS Take 1 capsule (600 mg total) by mouth daily.  . vitamin C (ASCORBIC ACID) 250 MG tablet Take 250 mg by mouth daily.  . Vitamin E 100 units TABS Take 1 tablet by mouth daily.  . Zinc 50 MG TABS Take 1 tablet by mouth daily.   No facility-administered encounter medications on file as of 04/19/2016.     Allergies: Statins; Atorvastatin; Erythromycin; Furosemide; Hydrocodone-acetaminophen; Meloxicam; Metoprolol; Montelukast; Omeprazole; and Potassium chloride  Social History   Social History  . Marital status: Married    Spouse name: N/A  . Number of children: N/A  . Years of education: some college   Occupational History  .      unemployed   Social History Main Topics  . Smoking status: Never  Smoker  . Smokeless tobacco: Never Used  . Alcohol use No  . Drug use: No  . Sexual activity: No   Other Topics Concern  . Not on file   Social History Narrative   Lives alone    Family History  Problem Relation Age of Onset  . Other Mother     killed in gas explosion  . Asthma Mother   . Heart murmur Father   . Arthritis Father   . Hyperlipidemia Father   . Arthritis Brother   . Sinusitis Brother   . Heart disease Neg Hx     Review of Systems: A 12-system review of systems was performed and was negative except as noted in the HPI.  --------------------------------------------------------------------------------------------------  Physical Exam: BP 124/64   Pulse 87   Ht 4\' 9"  (1.448 m)   Wt 181 lb (82.1 kg)   LMP  (LMP Unknown)   SpO2 98%   BMI 39.17 kg/m   General:  Obese woman, seated comfortably in the exam room. She is accompanied by a sign interpreter. HEENT: No conjunctival pallor or scleral icterus.  Moist mucous membranes.  OP clear. Neck: Supple without lymphadenopathy or thyromegaly. JVP is approximately 8-10 cm. No significant HJR. Lungs: Normal work of breathing.  Clear to auscultation bilaterally without wheezes or crackles. Heart: Regular rate and rhythm without murmurs, rubs, or gallops.  Unable to assess PMI due to body habitus. Abd: Bowel sounds present.  Soft, nontender, and nondistended. Unable to assess hepatosplenomegaly due to body habitus. Ext: 2+ pretibial edema to the proximal calves  bilaterally.  Radial, PT, and DP pulses are 2+ bilaterally. Skin: warm and dry without rash GU: No CVA tenderness   Lab Results  Component Value Date   NA 138 01/12/2016   K 4.8 01/12/2016   CL 103 01/12/2016   CO2 20 01/12/2016   BUN 25 01/12/2016   CREATININE 0.90 01/12/2016   GLUCOSE 77 01/12/2016   --------------------------------------------------------------------------------------------------  ASSESSMENT AND PLAN: Bicuspid aortic valve  status post bioprosthetic aortic valve replacement Patient does not have symptoms of heart failure. Echocardiogram after her last visit revealed elevated LVOT velocity/gradient, though this is within the range of expected values for a 19 mm Edwards Carpentier bioprosthetic aortic valve. We will continue routine clinical follow-up and consider repeating an echo if new symptoms develop. I again counseled the patient on importance of prophylactic antibiotic therapy before any dental procedure. We will continue indefinite aspirin.  Lower extremity edema This has been a chronic problem for the patient. JVP is borderline elevated as well. However, recent echocardiogram showed normal pulmonary artery and central venous pressures. She may have a component of chronic venous insufficiency. I have encouraged her to use knee-high compression stockings, to limit her salt take, and to weigh herself daily to evaluate for significant fluid retention. She remains hesitant to initiate a loop diuretic. We will therefore defer adding this unless she has further fluid retention.  Coarctation of the aorta Patient is normotensive today. Pedal pulses are palpable on exam. No further workup at this time.  Hyperlipidemia LDL noted to be 162 prior to coming to [place] (last checked 10/03/15). She does not have evidence of atherosclerotic cardiovascular disease, including clean coronary arteries by cath prior to AVR. I have encouraged lifestyle modifications. She would like to begin exercising and hopes to participate in the [employer] program. We will not initiate pharmacologic therapy, particularly given history of statin intolerance.  Low back pain Pain is localized in the right low back area. She is not have symptoms consistent with pyelonephritis or nephrolithiasis, including fevers, chills, dysuria and hematuria. There is no CVA tenderness on exam today. I suspect this is likely musculoskeletal. However, if she  develops fevers or other UTI symptoms, the patient should seek immediate medical attention. She should also contact her PCP if the symptoms do not begin to improve over the next few days.  Follow-up: Return to clinic in 6 months.  Yvonne Kendall, MD 04/19/2016 1:30 PM

## 2016-04-21 ENCOUNTER — Encounter: Payer: Self-pay | Admitting: Internal Medicine

## 2016-04-21 DIAGNOSIS — Q251 Coarctation of aorta: Secondary | ICD-10-CM | POA: Insufficient documentation

## 2016-04-21 DIAGNOSIS — Q231 Congenital insufficiency of aortic valve: Secondary | ICD-10-CM | POA: Insufficient documentation

## 2016-05-06 ENCOUNTER — Encounter (HOSPITAL_COMMUNITY): Payer: Self-pay | Admitting: Family Medicine

## 2016-05-06 NOTE — Progress Notes (Signed)
Mailed patient Cardiac Rehab letter and program information.  MW

## 2016-06-03 ENCOUNTER — Ambulatory Visit: Payer: Medicare Other | Admitting: Cardiology

## 2016-06-27 ENCOUNTER — Other Ambulatory Visit: Payer: Self-pay | Admitting: Family Medicine

## 2016-06-27 DIAGNOSIS — I10 Essential (primary) hypertension: Secondary | ICD-10-CM

## 2016-06-27 DIAGNOSIS — E039 Hypothyroidism, unspecified: Secondary | ICD-10-CM

## 2016-07-29 NOTE — Progress Notes (Deleted)
    Subjective:  Ashley Torres is a 61 y.o. female who [facility] with a chief complaint of ***.   HPI:   ***HIST  Objective:  Physical Exam: LMP  (LMP Unknown)   Gen: ***NAD, resting comfortably CV: RRR with no murmurs appreciated Pulm: NWOB, CTAB with no crackles, wheezes, or rhonchi GI: Normal bowel sounds present. Soft, Nontender, Nondistended. MSK: no edema, cyanosis, or clubbing noted Skin: warm, dry Neuro: grossly normal, moves all extremities Psych: Normal affect and thought content  No results found for this or any previous visit (from the past 72 hour(s)).   Assessment/Plan:  No problem-specific Assessment & Plan notes found for this encounter.   Leland HerElsia J Yoo, DO PGY-***, [facility] 07/29/2016 5:10 PM

## 2016-07-30 ENCOUNTER — Ambulatory Visit: Payer: Medicaid Other | Admitting: Family Medicine

## 2016-08-05 NOTE — Progress Notes (Signed)
    Subjective:  Toniann FailDawn Misko is a 61 y.o. female who [facility]. History taken via sign language interpreter.   HPI:  HTN - Compliant on HCTZ and lisinopril, tolerating well.  No TIA's, no chest pain on exertion, no dyspnea on exertion. States her LE edema is better than her baseline today.  HLD - Taking red yeast rice since has statin intolerance. Eating healthy and exercising through [employer] program which she very much enjoys.  Environmental allergies and seasonal allergies - States has been worse than usual with the change in weather lately but overall doing well on her rhinocort and chlorpheniramine   Hypothyroidism - Compliant on her levothyroxine, tolerating well. No intentional weight changes/heat or cold intolerance/nail or skin changes.  ROS: Per HPI  Objective:  Physical Exam: BP 128/69   Pulse 69   Temp 98.3 F (36.8 C) (Oral)   Wt 173 lb (78.5 kg)   LMP  (LMP Unknown)   SpO2 96%   BMI 37.44 kg/m   Gen: NAD, resting comfortably CV: RRR with no murmurs appreciated Pulm: NWOB, CTAB with no crackles, wheezes, or rhonchi GI: Normal bowel sounds present. Soft, Nontender, Nondistended. MSK: 1+ pitting edema in legs Skin: warm, dry Psych: Normal affect and thought content    Assessment/Plan:  HTN (hypertension) Stable. At goal.  - Continue current med regiment of HCTZ and lisinopril.  - Will check yearly labs including BMP at next visit.  HLD (hyperlipidemia) Doing well with lifestyle modifications including regular exercise through [employer] program. Weight is down to 173 from 181# last visit. Not on statin d/t intolerance. - Continue red yeast rice - Encouraged patient's efforts with her good lifestyle changes  Environmental and seasonal allergies Stable. Continue current management.  Hypothyroidism (acquired) Stable. Levothyroxine refilled today. Will recheck at next visit.   Leland HerElsia J Waino Mounsey, DO PGY-1, [facility] 08/06/2016 11:01 AM

## 2016-08-06 ENCOUNTER — Ambulatory Visit (INDEPENDENT_AMBULATORY_CARE_PROVIDER_SITE_OTHER): Payer: Medicare Other | Admitting: Family Medicine

## 2016-08-06 ENCOUNTER — Encounter: Payer: Self-pay | Admitting: Family Medicine

## 2016-08-06 DIAGNOSIS — J3089 Other allergic rhinitis: Secondary | ICD-10-CM

## 2016-08-06 DIAGNOSIS — I10 Essential (primary) hypertension: Secondary | ICD-10-CM

## 2016-08-06 DIAGNOSIS — E785 Hyperlipidemia, unspecified: Secondary | ICD-10-CM | POA: Diagnosis not present

## 2016-08-06 DIAGNOSIS — E039 Hypothyroidism, unspecified: Secondary | ICD-10-CM

## 2016-08-06 MED ORDER — LISINOPRIL 20 MG PO TABS: 20.0000 mg | ORAL_TABLET | Freq: Every day | ORAL | 3 refills | Status: DC

## 2016-08-06 MED ORDER — HYDROCHLOROTHIAZIDE 25 MG PO TABS: 25.0000 mg | ORAL_TABLET | Freq: Every day | ORAL | 3 refills | Status: DC

## 2016-08-06 MED ORDER — LEVOTHYROXINE SODIUM 112 MCG PO TABS: ORAL_TABLET | ORAL | 3 refills | Status: DC

## 2016-08-06 NOTE — Assessment & Plan Note (Addendum)
Stable. At goal.  - Continue current med regiment of HCTZ and lisinopril.  - Will check yearly labs including BMP at next visit.

## 2016-08-06 NOTE — Assessment & Plan Note (Signed)
Stable  Continue current management

## 2016-08-06 NOTE — Patient Instructions (Signed)
It was great to see you again!  Congratulations on the weight loss and the exercise you have been doing. It really is very impressive!  I will see you back in September for a regular follow up  Take care and seek immediate care sooner if you develop any concerns.   Dr. Leland HerElsia J Merdith Boyd, DO [facility]

## 2016-08-06 NOTE — Assessment & Plan Note (Addendum)
Doing well with lifestyle modifications including regular exercise through [employer] program. Weight is down to 173 from 181# last visit. Not on statin d/t intolerance. - Continue red yeast rice - Encouraged patient's efforts with her good lifestyle changes

## 2016-08-06 NOTE — Assessment & Plan Note (Signed)
Stable. Levothyroxine refilled today. Will recheck at next visit.

## 2016-10-18 ENCOUNTER — Encounter: Payer: Self-pay | Admitting: Internal Medicine

## 2016-10-18 ENCOUNTER — Encounter (INDEPENDENT_AMBULATORY_CARE_PROVIDER_SITE_OTHER): Payer: Self-pay

## 2016-10-18 ENCOUNTER — Ambulatory Visit (INDEPENDENT_AMBULATORY_CARE_PROVIDER_SITE_OTHER): Payer: Medicare Other | Admitting: Internal Medicine

## 2016-10-18 VITALS — BP 128/82 | HR 90 | Ht <= 58 in | Wt 179.6 lb

## 2016-10-18 DIAGNOSIS — Q251 Coarctation of aorta: Secondary | ICD-10-CM

## 2016-10-18 DIAGNOSIS — Z952 Presence of prosthetic heart valve: Secondary | ICD-10-CM | POA: Diagnosis not present

## 2016-10-18 DIAGNOSIS — R600 Localized edema: Secondary | ICD-10-CM | POA: Diagnosis not present

## 2016-10-18 DIAGNOSIS — I10 Essential (primary) hypertension: Secondary | ICD-10-CM | POA: Diagnosis not present

## 2016-10-18 DIAGNOSIS — Z8774 Personal history of (corrected) congenital malformations of heart and circulatory system: Secondary | ICD-10-CM | POA: Diagnosis not present

## 2016-10-18 NOTE — Progress Notes (Signed)
Follow-up Outpatient Visit Date: 10/18/2016  Primary Care Provider: Leland HerYoo, Elsia J, DO [address] [place] [id number]  Chief Complaint: Leg swelling  HPI:  [name] is a 61 y.o. year-old female with history of bicuspid aortic valve status post bioprosthetic aortic valve replacement in 03/2015, coarctation of the aorta status post repair as a child, hypertension, hyperlipidemia, and congenital deafness, who presents for follow-up of her AVR and coarctation of the aorta. Today, [name] reports feeling well. She denies chest pain, short of breath, palpitations, lightheadedness, orthopnea, and PND. She has intermittent leg swelling but has been using ACE wraps on her calves with improvement in the edema. She does not wear compression stockings. She remains on hydrochlorothiazide but has been hesitant to try furosemide in the past. She denies significant bleeding. She has been exercising and feels as though she is building up her muscles. However, she has not lost much weight.  --------------------------------------------------------------------------------------------------  Cardiovascular History & Procedures: Cardiovascular Problems:  Congenitally malformed aortic valve status post bioprosthetic aortic valve replacement   Coarctation of the aorta status post repair as a child  Risk Factors:  Hypertension and hyperlipidemia  Cath/PCI:  [facility] LMCA normal. LAD normal. LCx normal. RCA normal.  CV Surgery:  Aortic valve replacement with annulus enlargement (19 mm Magna model 300 Carpentier-Edwards bovine pericardial heart valve) on [facility]17[facility]  Coarctation of aorta repair (~61 years of age)  EP Procedures and Devices:  None  Non-Invasive Evaluation(s):  Transthoracic echocardiogram (02/06/16): Normal LV size with mild focal basal hypertrophy.  LVEF 60-65%  with grade 1 diastolic dysfunction and elevated LV filling pressure. Bioprosthetic aortic valve well-seated with peak transvalvular velocity of 3.5 m/s; mean gradient 25 mmHg.  Mitral annular calcification present.  Moderate left atrial enlargement.  Normal RV size and function.  Trivial TR.  Normal pulmonary artery pressure.  Transthoracic echocardiogram (02/07/15): Normal LV size with mild LVH and mild focal basal hypertrophy of the septum. LVEF 60-65%. Aortic valve with moderate annular calcification and bicuspid morphology. Valve is severely thickened and calcified with severe stenosis (mean gradient 40 mmHg, aortic valve area 0.9 cm). Mild mitral annular calcification and valvular thickening. Mild left atrial enlargement. Normal RV size and function.  Recent CV Pertinent Labs: Lab Results  Component Value Date   K 4.8 01/12/2016   BUN 25 01/12/2016   CREATININE 0.90 01/12/2016    Past medical and surgical history were reviewed and updated in EPIC.  Current Meds  Medication Sig  . acetaminophen (TYLENOL) 500 MG tablet Take 1,000 mg by mouth every 6 (six) hours as needed for moderate pain.  Marland Kitchen. aspirin EC 81 MG tablet Take 81 mg by mouth daily.  . budesonide (RHINOCORT AQUA) 32 MCG/ACT nasal spray Place 1 spray into both nostrils daily as needed.  . Calcium Carb-Cholecalciferol [phone] MG-UNIT CAPS Take 2 tablets by mouth daily.  . calcium carbonate (TUMS - DOSED IN MG ELEMENTAL CALCIUM) 500 MG chewable tablet Chew 1 tablet by mouth 4 (four) times daily.  . chlorpheniramine (CHLOR-TRIMETON) 4 MG tablet Take 4 mg by mouth as needed for allergies.  Marland Kitchen. FOLIC ACID PO Take 1 tablet by mouth daily.  Marland Kitchen. GLYCERIN-HYPROMELLOSE-PEG 400 OP Apply 1 drop to eye as needed (Dry eyes).  . hydrochlorothiazide (HYDRODIURIL) 25 MG tablet Take 1 tablet (25 mg total) by mouth daily.  Marland Kitchen. levothyroxine (SYNTHROID, LEVOTHROID) 112 MCG tablet TAKE 1 TABLET BY MOUTH  DAILY BEFORE BREAKFAST  . lisinopril  (PRINIVIL,ZESTRIL) 20 MG tablet Take 1 tablet (20 mg total) by mouth daily.  . Multiple Vitamins-Minerals (OCUVITE PRESERVISION PO) Take 1 tablet by mouth daily.  . Red Yeast Rice 600 MG CAPS Take 1 capsule (600 mg total) by mouth daily.  . vitamin C (ASCORBIC ACID) 250 MG tablet Take 250 mg by mouth daily.  . Vitamin E 100 units TABS Take 1 tablet by mouth daily.  . Zinc 50 MG TABS Take 1 tablet by mouth daily.    Allergies: Statins; Atorvastatin; Erythromycin; Furosemide; Hydrocodone-acetaminophen; Meloxicam; Metoprolol; Montelukast; Omeprazole; and Potassium chloride  Social History   Social History  . Marital status: Married    Spouse name: N/A  . Number of children: N/A  . Years of education: some college   Occupational History  .      unemployed   Social History Main Topics  . Smoking status: Never Smoker  . Smokeless tobacco: Never Used  . Alcohol use No  . Drug use: No  . Sexual activity: No   Other Topics Concern  . Not on file   Social History Narrative   Lives alone    Family History  Problem Relation Age of Onset  . Other Mother        killed in gas explosion  . Asthma Mother   . Heart murmur Father   . Arthritis Father   . Hyperlipidemia Father   . Arthritis Brother   . Sinusitis Brother   . Heart disease Neg Hx     Review of Systems: A 12-system review of systems was performed and was negative except as noted in the HPI.  --------------------------------------------------------------------------------------------------  Physical Exam: BP 128/82   Pulse 90   Ht 4\' 9"  (1.448 m)   Wt 179 lb 9.6 oz (81.5 kg)   LMP  (LMP Unknown)   SpO2 98%   BMI 38.87 kg/m   General:  Obese woman, seated comfortably in the exam room. She is accompanied by a sign language interpreter. HEENT: No conjunctival pallor or scleral icterus. Moist mucous membranes.  OP clear. Neck: Supple without lymphadenopathy or thyromegaly. No gross JVD or HJR, the evaluation is  limited by body habitus.  Lungs: Normal work of breathing. Clear to auscultation bilaterally without wheezes or crackles. Heart: Regular rate and rhythm with normal S1 and S2. There is a 2/6 crescendo-decrescendo murmur loudest at the right upper sternal border. No rubs or gallops.  Abd: Bowel sounds present. Soft, NT/ND. Unable to assess hepatosplenomegaly due to body habitus. Ext: 1-2+ pitting edema to the proximal calves bilaterally. Radial, PT, and DP pulses are 2+ bilaterally. Skin: Warm and dry without rash.  EKG:  Normal sinus rhythm with right atrial enlargement. Otherwise, no significant abnormalities.  No results found for: WBC, HGB, HCT, MCV, PLT  Lab Results  Component Value Date   NA 138 01/12/2016   K 4.8 01/12/2016   CL 103 01/12/2016   CO2 20 01/12/2016   BUN 25 01/12/2016   CREATININE 0.90 01/12/2016   GLUCOSE 77 01/12/2016    No results found for: CHOL, HDL, LDLCALC, LDLDIRECT, TRIG, CHOLHDL  --------------------------------------------------------------------------------------------------  ASSESSMENT AND PLAN: History of congenitally malformed aortic valve status post bioprosthetic valve replacement Mr. Compere is doing well without symptoms to suggest heart failure or valve dysfunction. Exam today is normal. Systolic murmur is not unexpected, given small size of implanted valve. Echocardiogram in November demonstrated reasonable gradient for this particular valve. We will continue with low-dose  aspirin. Prophylactic antibiotic use was again recommended before any dental procedure.  Coarctation of the aorta Good pedal pulses on exam today. No symptoms reported. Blood pressure is well controlled. No further workup at this time.  Lower extremity edema This is likely multifactorial including venous insufficiency. I have given Ms. Ground a prescription for compression stockings. We discussed the utility of trying furosemide, but she wishes to defer  this.  Hypertension Blood pressure is well controlled today. We will continue with lisinopril and hydrochlorothiazide. [name] is scheduled for follow-up with her PCP next month and states that labs will be checked at that time.  Follow-up: Return to clinic in 6 months.  Yvonne Kendallhristopher Anoushka Divito, MD 10/18/2016 1:26 PM

## 2016-10-18 NOTE — Patient Instructions (Addendum)
Medication Instructions:  Your physician recommends that you continue on your current medications as directed. Please refer to the Current Medication list given to you today.   Labwork: None   Testing/Procedures: None  Follow-Up: Your physician wants you to follow-up in: 6 months with Dr End. (February 2019).  You will receive a reminder letter in the mail two months in advance. If you don't receive a letter, please call our office to schedule the follow-up appointment.        If you need a refill on your cardiac medications before your next appointment, please call your pharmacy.   

## 2016-12-13 ENCOUNTER — Encounter: Payer: Self-pay | Admitting: Family Medicine

## 2016-12-13 ENCOUNTER — Ambulatory Visit (INDEPENDENT_AMBULATORY_CARE_PROVIDER_SITE_OTHER): Payer: Medicare Other | Admitting: Family Medicine

## 2016-12-13 VITALS — BP 124/60 | HR 92 | Temp 97.9°F | Ht <= 58 in | Wt 179.0 lb

## 2016-12-13 DIAGNOSIS — E039 Hypothyroidism, unspecified: Secondary | ICD-10-CM

## 2016-12-13 DIAGNOSIS — L918 Other hypertrophic disorders of the skin: Secondary | ICD-10-CM

## 2016-12-13 DIAGNOSIS — L821 Other seborrheic keratosis: Secondary | ICD-10-CM | POA: Diagnosis not present

## 2016-12-13 DIAGNOSIS — I10 Essential (primary) hypertension: Secondary | ICD-10-CM | POA: Diagnosis not present

## 2016-12-13 DIAGNOSIS — E669 Obesity, unspecified: Secondary | ICD-10-CM

## 2016-12-13 NOTE — Assessment & Plan Note (Signed)
Stable. Will recheck TSH along with yearly blood work today.

## 2016-12-13 NOTE — Progress Notes (Signed)
    Subjective:  Ashley Torres is a 61 y.o. female who presents to the [facility] today with a chief complaint of skin tags and for yearly blood work. History via sign language interpreter  HPI:  Skin Tags -The patient complains of 2 symptomatic skin tags on the neck. These are irritated by clothing and rubbing.  Weight loss - continue to be quite active with Silver sneakers - Feels that her weight has fluctuated at home - Has continued to make good healthy diet choices  Hypothyroidism - Compliant on her home levothyroxine, feels well - no chest pain, shortness of breath, fatigue, hair or skin or nail changes, cold intolerance or heat intolerance  ROS: Per HPI  PMH: Smoking history reviewed.   she reports that she has never smoked. She has never used smokeless tobacco. She reports that she does not drink alcohol or use drugs.   Objective:  Physical Exam: BP 124/60   Pulse 92   Temp 97.9 F (36.6 C) (Oral)   Ht  (1.448 m)   Wt 179 lb (81.2 kg)   LMP  (LMP Unknown)   SpO2 97%   BMI 38.74 kg/m   Gen: NAD, resting comfortably CV: RRR with no murmurs appreciated Pulm: NWOB, CTAB with no crackles, wheezes, or rhonchi GI: Normal bowel sounds present. Soft, Nontender, Nondistended. MSK: no edema, cyanosis, or clubbing noted Skin: 2 small benign skin tags are noted on the anterior neck. Multiple waxy/scaly stuck on brown macules diffusely on back. Psych: Normal affect and thought content   Assessment/Plan:  Cutaneous skin tags Skin tags are snipped off using Betadine for cleansing and sterile scalpel. Local anesthesia 1%lidocaine with epinephrine was used, a total of 1cc. These pathognomonic lesions are not sent for pathology.  Seborrheic keratoses Patient has diffuse brown macules on her back consistent with benign seborrheic keratoses. Offered cryosurgery on these lesions if patient desires, patient stated that she would consider in the future  Hypothyroidism  (acquired) Stable. Will recheck TSH along with yearly blood work today.   Obesity, Class II, BMI 35-39.9 Stable. Patient has continued to exercise regularly and weight stable from last visit. Check LDL today.  Patient advised to make Medicare annual wellness visit at her convenience. Otherwise follow-up with me in 6 months.  Leland Her, DO PGY-2, [facility] 12/13/2016 2:00 PM

## 2016-12-13 NOTE — Assessment & Plan Note (Signed)
Patient has diffuse brown macules on her back consistent with benign seborrheic keratoses. Offered cryosurgery on these lesions if patient desires, patient stated that she would consider in the future

## 2016-12-13 NOTE — Patient Instructions (Addendum)
It was good to see you today!  For your skin tag removal,  - Keep the bandaid on for 24 hours. Then you can shower as normal. Try to keep area clean and dry.  - You can come back whenever you'd like to have the age spots frozen on your back  We are checking some labs today. If results require attention, either myself or my nurse will get in touch with you. If everything is normal, you will get a letter in the mail or a message in My Chart. Please give [place] a call if you do not hear from [place] after 2 weeks.  Please check-out at the front desk before leaving the clinic. Make an appointment at your convenience for a Medicare AWV. I will see you back in  6 months at the latest.  Please bring all of your medications with you to each visit.   Sign up for My Chart to have easy access to your labs results, and communication with your primary care physician.  Feel free to call with any questions or concerns at any time, at [phone].   Take care,  Dr. Leland Her, DO [facility]

## 2016-12-13 NOTE — Assessment & Plan Note (Signed)
Stable. Patient has continued to exercise regularly and weight stable from last visit. Check LDL today.

## 2016-12-13 NOTE — Assessment & Plan Note (Signed)
Skin tags are snipped off using Betadine for cleansing and sterile scalpel. Local anesthesia 1%lidocaine with epinephrine was used, a total of 1cc. These pathognomonic lesions are not sent for pathology.

## 2016-12-14 LAB — CBC
Hematocrit: 39.3 % (ref 34.0–46.6)
Hemoglobin: 13 g/dL (ref 11.1–15.9)
MCH: 28.4 pg (ref 26.6–33.0)
MCHC: 33.1 g/dL (ref 31.5–35.7)
MCV: 86 fL (ref 79–97)
PLATELETS: 222 10*3/uL (ref 150–379)
RBC: 4.57 x10E6/uL (ref 3.77–5.28)
RDW: 14.3 % (ref 12.3–15.4)
WBC: 5.5 10*3/uL (ref 3.4–10.8)

## 2016-12-14 LAB — BASIC METABOLIC PANEL
BUN / CREAT RATIO: 28 — AB (ref 9–23)
BUN: 22 mg/dL (ref 6–24)
CO2: 26 mmol/L (ref 20–29)
Calcium: 9.6 mg/dL (ref 8.7–10.2)
Chloride: 101 mmol/L (ref 96–106)
Creatinine, Ser: 0.8 mg/dL (ref 0.57–1.00)
GFR, EST AFRICAN AMERICAN: 94 mL/min/{1.73_m2} (ref >59)
GFR, EST NON AFRICAN AMERICAN: 82 mL/min/{1.73_m2} (ref >59)
Glucose: 80 mg/dL (ref 65–99)
POTASSIUM: 4.2 mmol/L (ref 3.5–5.2)
SODIUM: 144 mmol/L (ref 134–144)

## 2016-12-14 LAB — TSH: TSH: 0.367 u[IU]/mL — ABNORMAL LOW (ref 0.450–4.500)

## 2016-12-14 LAB — LDL CHOLESTEROL, DIRECT: LDL DIRECT: 135 mg/dL — AB (ref 0–99)

## 2016-12-16 ENCOUNTER — Other Ambulatory Visit: Payer: Self-pay | Admitting: Family Medicine

## 2016-12-16 ENCOUNTER — Telehealth: Payer: Self-pay | Admitting: Family Medicine

## 2016-12-16 ENCOUNTER — Encounter: Payer: Self-pay | Admitting: Family Medicine

## 2016-12-16 DIAGNOSIS — E039 Hypothyroidism, unspecified: Secondary | ICD-10-CM

## 2016-12-16 MED ORDER — LEVOTHYROXINE SODIUM 100 MCG PO TABS: ORAL_TABLET | ORAL | 0 refills | Status: DC

## 2016-12-16 NOTE — Telephone Encounter (Signed)
Called patient via sign language interpreter. Discussed decreasing levothyroxine from to based on TSH. All questions were answered. Rx sent into pharmacy under separate orders only encounter.

## 2016-12-27 ENCOUNTER — Ambulatory Visit (INDEPENDENT_AMBULATORY_CARE_PROVIDER_SITE_OTHER): Payer: Medicare Other | Admitting: Family Medicine

## 2016-12-27 ENCOUNTER — Encounter: Payer: Self-pay | Admitting: Family Medicine

## 2016-12-27 VITALS — BP 118/62 | HR 68 | Temp 98.3°F | Ht <= 58 in | Wt 180.0 lb

## 2016-12-27 DIAGNOSIS — J3089 Other allergic rhinitis: Secondary | ICD-10-CM

## 2016-12-27 DIAGNOSIS — B9689 Other specified bacterial agents as the cause of diseases classified elsewhere: Secondary | ICD-10-CM

## 2016-12-27 DIAGNOSIS — J019 Acute sinusitis, unspecified: Secondary | ICD-10-CM | POA: Diagnosis not present

## 2016-12-27 MED ORDER — AMOXICILLIN 875 MG PO TABS: 875.0000 mg | ORAL_TABLET | Freq: Two times a day (BID) | ORAL | 0 refills | Status: AC

## 2016-12-27 MED ORDER — ALBUTEROL SULFATE 108 (90 BASE) MCG/ACT IN AEPB: 1.0000 | INHALATION_SPRAY | RESPIRATORY_TRACT | 2 refills | Status: DC | PRN

## 2016-12-27 NOTE — Progress Notes (Signed)
    Subjective:  Ashley Torres is a 61 y.o. female who presents to the [facility] today with a chief complaint of congestion.   HPI:  Congestion - has had sinus pressure, sore throat, post nasal drip, cough productive thick white-yellow sputum - Sick since last Tuesday , for the past 10 days - Did have a period of time where she thought it was getting better before got worse again - No fever/chills. No nausea or vomiting. No SOB. - Has had some wheezing for which she used her albuterol inhaler with good relief  ROS: Per HPI  Objective:  Physical Exam: BP 118/62   Pulse 68   Temp 98.3 F (36.8 C) (Oral)   Ht  (1.448 m)   Wt 180 lb (81.6 kg)   LMP  (LMP Unknown)   SpO2 98%   BMI 38.95 kg/m   Gen: NAD, resting comfortably HEENT: TMs with good light reflux b/l, no erythema. Oropharynx nonerythematous without exudates CV: RRR with no murmurs appreciated Pulm: NWOB. Few scattered end expiratory wheezes on lung exam without rhonchi/rales/crackles. GI: Normal bowel sounds present. Soft, Nontender, Nondistended. MSK: no edema, cyanosis, or clubbing noted Skin: warm, dry   Assessment/Plan:  Acute bacterial sinusitis Patient has had congestion for 10 days with double worsening. Will treat with amoxicillin for 7 days. She is afebrile and well appearing on exam today. She did have some scattered wheezes so her home albuterol inhaler was refilled today. Given return precautions.   Leland Her, DO PGY-2, [facility] 12/27/2016 2:00 PM

## 2016-12-27 NOTE — Assessment & Plan Note (Signed)
Patient has had congestion for 10 days with double worsening. Will treat with amoxicillin for 7 days. She is afebrile and well appearing on exam today. She did have some scattered wheezes so her home albuterol inhaler was refilled today. Given return precautions.

## 2016-12-27 NOTE — Patient Instructions (Signed)
Take amoxicillin twice a day for 7 days. I have refilled your albuterol inhaler. Stay well hydrated and get plenty of rest.   Sinusitis, Adult Sinusitis is soreness and inflammation of your sinuses. Sinuses are hollow spaces in the bones around your face. They are located:  Around your eyes.  In the middle of your forehead.  Behind your nose.  In your cheekbones.  Your sinuses and nasal passages are lined with a stringy fluid (mucus). Mucus normally drains out of your sinuses. When your nasal tissues get inflamed or swollen, the mucus can get trapped or blocked so air cannot flow through your sinuses. This lets bacteria, viruses, and funguses grow, and that leads to infection. Follow these instructions at home: Medicines  Take, use, or apply over-the-counter and prescription medicines only as told by your doctor. These may include nasal sprays.  If you were prescribed an antibiotic medicine, take it as told by your doctor. Do not stop taking the antibiotic even if you start to feel better. Hydrate and Humidify  Drink enough water to keep your pee (urine) clear or pale yellow.  Use a cool mist humidifier to keep the humidity level in your home above 50%.  Breathe in steam for 10-15 minutes, 3-4 times a day or as told by your doctor. You can do this in the bathroom while a hot shower is running.  Try not to spend time in cool or dry air. Rest  Rest as much as possible.  Sleep with your head raised (elevated).  Make sure to get enough sleep each night. General instructions  Put a warm, moist washcloth on your face 3-4 times a day or as told by your doctor. This will help with discomfort.  Wash your hands often with soap and water. If there is no soap and water, use hand sanitizer.  Do not smoke. Avoid being around people who are smoking (secondhand smoke).  Keep all follow-up visits as told by your doctor. This is important. Contact a doctor if:  You have a  fever.  Your symptoms get worse.  Your symptoms do not get better within 10 days. Get help right away if:  You have a very bad headache.  You cannot stop throwing up (vomiting).  You have pain or swelling around your face or eyes.  You have trouble seeing.  You feel confused.  Your neck is stiff.  You have trouble breathing. This information is not intended to replace advice given to you by your health care provider. Make sure you discuss any questions you have with your health care provider. Document Released: 08/21/2007 Document Revised: 10/29/2015 Document Reviewed: 12/28/2014 Elsevier Interactive Patient Education  [employer].

## 2017-01-08 DIAGNOSIS — H2702 Aphakia, left eye: Secondary | ICD-10-CM | POA: Diagnosis not present

## 2017-03-03 ENCOUNTER — Other Ambulatory Visit: Payer: Self-pay | Admitting: Internal Medicine

## 2017-03-03 DIAGNOSIS — Z1231 Encounter for screening mammogram for malignant neoplasm of breast: Secondary | ICD-10-CM

## 2017-03-12 ENCOUNTER — Other Ambulatory Visit: Payer: Self-pay | Admitting: Family Medicine

## 2017-03-12 DIAGNOSIS — J3089 Other allergic rhinitis: Secondary | ICD-10-CM

## 2017-03-12 DIAGNOSIS — E039 Hypothyroidism, unspecified: Secondary | ICD-10-CM

## 2017-03-12 DIAGNOSIS — I10 Essential (primary) hypertension: Secondary | ICD-10-CM

## 2017-03-12 MED ORDER — HYDROCHLOROTHIAZIDE 25 MG PO TABS: 25.0000 mg | ORAL_TABLET | Freq: Every day | ORAL | 3 refills | Status: DC

## 2017-03-12 MED ORDER — LISINOPRIL 20 MG PO TABS: 20.0000 mg | ORAL_TABLET | Freq: Every day | ORAL | 3 refills | Status: DC

## 2017-03-12 MED ORDER — LEVOTHYROXINE SODIUM 100 MCG PO TABS: ORAL_TABLET | ORAL | 3 refills | Status: DC

## 2017-03-12 MED ORDER — ALBUTEROL SULFATE 108 (90 BASE) MCG/ACT IN AEPB: 1.0000 | INHALATION_SPRAY | RESPIRATORY_TRACT | 2 refills | Status: DC | PRN

## 2017-03-12 NOTE — Telephone Encounter (Signed)
Pt is calling to get refills on her Lisinopril, Hydrochlorothiazide, Levothyroxine, and Pro Air Inhaler. jw

## 2017-03-21 ENCOUNTER — Ambulatory Visit: Payer: Medicare Other | Admitting: Family Medicine

## 2017-04-01 ENCOUNTER — Ambulatory Visit
Admission: RE | Admit: 2017-04-01 | Discharge: 2017-04-01 | Disposition: A | Discharge status: Home / Self Care | Payer: Medicare Other | Source: Ambulatory Visit | Attending: Internal Medicine | Admitting: Internal Medicine

## 2017-04-01 DIAGNOSIS — Z1231 Encounter for screening mammogram for malignant neoplasm of breast: Secondary | ICD-10-CM | POA: Diagnosis not present

## 2017-05-01 ENCOUNTER — Ambulatory Visit: Payer: Medicare Other | Admitting: Internal Medicine

## 2017-06-23 ENCOUNTER — Encounter: Payer: Self-pay | Admitting: Family Medicine

## 2017-06-23 ENCOUNTER — Other Ambulatory Visit: Payer: Self-pay

## 2017-06-23 ENCOUNTER — Ambulatory Visit (INDEPENDENT_AMBULATORY_CARE_PROVIDER_SITE_OTHER): Payer: Medicare Other | Admitting: Family Medicine

## 2017-06-23 VITALS — BP 122/74 | HR 75 | Temp 97.9°F | Wt 176.0 lb

## 2017-06-23 DIAGNOSIS — E039 Hypothyroidism, unspecified: Secondary | ICD-10-CM

## 2017-06-23 DIAGNOSIS — L918 Other hypertrophic disorders of the skin: Secondary | ICD-10-CM

## 2017-06-23 DIAGNOSIS — I10 Essential (primary) hypertension: Secondary | ICD-10-CM

## 2017-06-23 MED ORDER — LEVOTHYROXINE SODIUM 100 MCG PO TABS: ORAL_TABLET | ORAL | 3 refills | Status: DC

## 2017-06-23 MED ORDER — LISINOPRIL 20 MG PO TABS: 20.0000 mg | ORAL_TABLET | Freq: Every day | ORAL | 3 refills | Status: DC

## 2017-06-23 MED ORDER — HYDROCHLOROTHIAZIDE 25 MG PO TABS: 25.0000 mg | ORAL_TABLET | Freq: Every day | ORAL | 3 refills | Status: DC

## 2017-06-23 NOTE — Progress Notes (Signed)
    Subjective:  Ashley Torres is a 62 y.o. female who [facility] today with a chief complaint of skin concerns.   HPI:  Has 2 skin tags under R arm pit that have been catching on her clothing and causing her some discomfort.  She would like them removed she also has a rough spot on her back that she would like examined.   ROS: Per HPI  Objective:  Physical Exam: BP 122/74   Pulse 75   Temp 97.9 F (36.6 C) (Oral)   Wt 176 lb (79.8 kg)   LMP  (LMP Unknown)   SpO2 98%   BMI 38.09 kg/m   Gen: NAD, resting comfortably Skin: 2 small pedunculated fleshed colored skin tags under R axilla. No erythema. 1cm raised stuck on brown patch with some greasy scale on L lower back.  Assessment/Plan:  Cutaneous skin tags Skin tags were snipped off using Betadine for cleansing and sterile scalpel. Local anesthesia 1%lidocaine with epinephrine was used, a total of 0.5cc. These pathognomonic lesions are not sent for pathology.   Leland HerElsia J Verlee Pope, DO PGY-2, [facility] 06/23/2017 3:24 PM

## 2017-06-23 NOTE — Patient Instructions (Addendum)
For your skin tag removal,  - Keep the bandaid on for 24 hours. Then you can shower as normal. Try to keep area clean and dry.

## 2017-08-18 ENCOUNTER — Encounter (INDEPENDENT_AMBULATORY_CARE_PROVIDER_SITE_OTHER): Payer: Self-pay

## 2017-08-18 ENCOUNTER — Encounter: Payer: Self-pay | Admitting: Cardiology

## 2017-08-18 ENCOUNTER — Ambulatory Visit (INDEPENDENT_AMBULATORY_CARE_PROVIDER_SITE_OTHER): Payer: Medicare Other | Admitting: Cardiology

## 2017-08-18 VITALS — BP 114/60 | HR 95 | Ht <= 58 in | Wt 180.4 lb

## 2017-08-18 DIAGNOSIS — Q251 Coarctation of aorta: Secondary | ICD-10-CM | POA: Diagnosis not present

## 2017-08-18 DIAGNOSIS — Z952 Presence of prosthetic heart valve: Secondary | ICD-10-CM | POA: Diagnosis not present

## 2017-08-18 DIAGNOSIS — R600 Localized edema: Secondary | ICD-10-CM

## 2017-08-18 DIAGNOSIS — J3089 Other allergic rhinitis: Secondary | ICD-10-CM | POA: Diagnosis not present

## 2017-08-18 MED ORDER — ALBUTEROL SULFATE 108 (90 BASE) MCG/ACT IN AEPB: 1.0000 | INHALATION_SPRAY | RESPIRATORY_TRACT | 2 refills | Status: DC | PRN

## 2017-08-18 NOTE — Patient Instructions (Signed)
Medication Instructions:   Your physician recommends that you continue on your current medications as directed. Please refer to the Current Medication list given to you today.    Testing/Procedures:  Your physician has requested that you have an echocardiogram. Echocardiography is a painless test that uses sound waves to create images of your heart. It provides your doctor with information about the size and shape of your heart and how well your heart's chambers and valves are working. This procedure takes approximately one hour. There are no restrictions for this procedure.    Follow-Up:  Your physician wants you to follow-up in: 6 MONTHS WITH DR Johnell ComingsNELSON You will receive a reminder letter in the mail two months in advance. If you don't receive a letter, please call our office to schedule the follow-up appointment.     *DR. NELSON HAS WRITTEN YOU A PRESCRIPTION FOR COMPRESSION STOCKINGS.  PLEASE TAKE THIS PRESCRIPTION TO YOUR LOCAL MEDICAL SUPPLY STORE TO BE FITTED AND FILLED*     If you need a refill on your cardiac medications before your next appointment, please call your pharmacy.

## 2017-08-18 NOTE — Progress Notes (Addendum)
Follow-up Outpatient Visit Date: 08/18/2017  Primary Care Provider: Leland Her, DO [address] [place] [address]  Chief Complaint: Leg swelling  HPI:  Ashley Torres is a 62 y.o. year-old female with history of bicuspid aortic valve status post bioprosthetic aortic valve replacement in 03/2015, coarctation of the aorta status post repair as a child, hypertension, hyperlipidemia, and congenital deafness, who presents for follow-up of her AVR and coarctation of the aorta. Today, Ashley Torres reports feeling well. She denies chest pain, short of breath, palpitations, lightheadedness, orthopnea, and PND. She has intermittent leg swelling but has been using ACE wraps on her calves with improvement in the edema. She does not wear compression stockings. She remains on hydrochlorothiazide but has been hesitant to try furosemide in the past. She denies significant bleeding. She has been exercising and feels as though she is building up her muscles. However, she has not lost much weight.  08/18/2017 -the patient is coming with an interpreter, she states that she feels about the same as it was previously.  She has ongoing lower extremity edema that has not change from before.  She continues to use compression stockings but she has hard time putting them on.  She denies any worsening dyspnea on exertion no orthopnea or proximal nocturnal dyspnea.  She denies any palpitations and no claudications.  She has been feeling short of breath in the last few days since she ran out of albuterol, she attributes it to her allergies.  --------------------------------------------------------------------------------------------------  Cardiovascular History & Procedures: Cardiovascular Problems:  Congenitally malformed aortic valve status post bioprosthetic aortic valve replacement   Coarctation of the aorta status post repair as a child  Risk Factors:  Hypertension and hyperlipidemia  Cath/PCI:  Left heart  catheterization (02/27/15 at [facility]): LMCA normal. LAD normal. LCx normal. RCA normal.  CV Surgery:  Aortic valve replacement with annulus enlargement (19 mm Magna model 300 Carpentier-Edwards bovine pericardial heart valve) on 04/17/15 at [facility]  Coarctation of aorta repair (~60 years of age)  EP Procedures and Devices:  None  Non-Invasive Evaluation(s):  Transthoracic echocardiogram (02/06/16): Normal LV size with mild focal basal hypertrophy.  LVEF 60-65% with grade 1 diastolic dysfunction and elevated LV filling pressure. Bioprosthetic aortic valve well-seated with peak transvalvular velocity of 3.5 m/s; mean gradient 25 mmHg.  Mitral annular calcification present.  Moderate left atrial enlargement.  Normal RV size and function.  Trivial TR.  Normal pulmonary artery pressure.  Transthoracic echocardiogram (02/07/15): Normal LV size with mild LVH and mild focal basal hypertrophy of the septum. LVEF 60-65%. Aortic valve with moderate annular calcification and bicuspid morphology. Valve is severely thickened and calcified with severe stenosis (mean gradient 40 mmHg, aortic valve area 0.9 cm). Mild mitral annular calcification and valvular thickening. Mild left atrial enlargement. Normal RV size and function.  Recent CV Pertinent Labs: Lab Results  Component Value Date   LDLDIRECT 135 (H) 12/13/2016   K 4.2 12/13/2016   BUN 22 12/13/2016   CREATININE 0.80 12/13/2016   CREATININE 0.90 01/12/2016    Past medical and surgical history were reviewed and updated in EPIC.  Current Meds  Medication Sig  . acetaminophen (TYLENOL) 500 MG tablet Take 1,000 mg by mouth every 6 (six) hours as needed for moderate pain.  . Albuterol Sulfate (PROAIR RESPICLICK) 108 (90 Base) MCG/ACT AEPB Inhale 1 puff into the lungs every 4 (four) hours as needed.  Marland Kitchen aspirin EC 81 MG tablet Take  81 mg by mouth daily.  . budesonide (RHINOCORT AQUA) 32  MCG/ACT nasal spray Place 1 spray into both nostrils daily as needed.  . Calcium Carb-Cholecalciferol [phone] MG-UNIT CAPS Take 2 tablets by mouth daily.  . calcium carbonate (TUMS - DOSED IN MG ELEMENTAL CALCIUM) 500 MG chewable tablet Chew 1 tablet by mouth 4 (four) times daily.  . chlorpheniramine (CHLOR-TRIMETON) 4 MG tablet Take 4 mg by mouth as needed for allergies.  Marland Kitchen FOLIC ACID PO Take 1 tablet by mouth daily.  Marland Kitchen GLYCERIN-HYPROMELLOSE-PEG 400 OP Apply 1 drop to eye as needed (Dry eyes).  . hydrochlorothiazide (HYDRODIURIL) 25 MG tablet Take 1 tablet (25 mg total) by mouth daily.  Marland Kitchen levothyroxine (SYNTHROID, LEVOTHROID) 100 MCG tablet TAKE 1 TABLET BY MOUTH DAILY BEFORE BREAKFAST  . lisinopril (PRINIVIL,ZESTRIL) 20 MG tablet Take 1 tablet (20 mg total) by mouth daily.  . Multiple Vitamins-Minerals (OCUVITE PRESERVISION PO) Take 1 tablet by mouth daily.  . Red Yeast Rice 600 MG CAPS Take 1 capsule (600 mg total) by mouth daily.  . vitamin C (ASCORBIC ACID) 250 MG tablet Take 250 mg by mouth daily.  . Vitamin E 100 units TABS Take 1 tablet by mouth daily.  . Zinc 50 MG TABS Take 1 tablet by mouth daily.    Allergies: Statins; Atorvastatin; Erythromycin; Furosemide; Hydrocodone-acetaminophen; Meloxicam; Metoprolol; Montelukast; Omeprazole; and Potassium chloride  Social History   Socioeconomic History  . Marital status: Married    Spouse name: Not on file  . Number of children: Not on file  . Years of education: some college  . Highest education level: Not on file  Occupational History    Comment: unemployed  Social Needs  . Financial resource strain: Not on file  . Food insecurity:    Worry: Not on file    Inability: Not on file  . Transportation needs:    Medical: Not on file    Non-medical: Not on file  Tobacco Use  . Smoking status: Never Smoker  . Smokeless tobacco: Never Used  Substance and Sexual Activity  . Alcohol use: No  . Drug use: No  . Sexual activity:  Never  Lifestyle  . Physical activity:    Days per week: Not on file    Minutes per session: Not on file  . Stress: Not on file  Relationships  . Social connections:    Talks on phone: Not on file    Gets together: Not on file    Attends religious service: Not on file    Active member of club or organization: Not on file    Attends meetings of clubs or organizations: Not on file    Relationship status: Not on file  . Intimate partner violence:    Fear of current or ex partner: Not on file    Emotionally abused: Not on file    Physically abused: Not on file    Forced sexual activity: Not on file  Other Topics Concern  . Not on file  Social History Narrative   Lives alone    Family History  Problem Relation Age of Onset  . Other Mother        killed in gas explosion  . Asthma Mother   . Heart murmur Father   . Arthritis Father   . Hyperlipidemia Father   . Arthritis Brother   . Sinusitis Brother   . Heart disease Neg Hx     Review of Systems: A 12-system review of systems was performed  and was negative except as noted in the HPI.  --------------------------------------------------------------------------------------------------  Physical Exam: BP 114/60   Pulse 95   Ht 4\' 9"  (1.448 m)   Wt 180 lb 6.4 oz (81.8 kg)   LMP  (LMP Unknown)   SpO2 95%   BMI 39.04 kg/m   General:  Obese woman, seated comfortably in the exam room. She is accompanied by a sign language interpreter. HEENT: No conjunctival pallor or scleral icterus. Moist mucous membranes.  OP clear. Neck: Supple without lymphadenopathy or thyromegaly. No gross JVD or HJR, the evaluation is limited by body habitus.  Lungs: Normal work of breathing. Clear to auscultation bilaterally without wheezes or crackles. Heart: Regular rate and rhythm with normal S1 and S2. There is a 2/6 crescendo-decrescendo murmur loudest at the right upper sternal border. No rubs or gallops.  Abd: Bowel sounds present. Soft,  NT/ND. Unable to assess hepatosplenomegaly due to body habitus. Ext: 1-2+ pitting edema to the proximal calves bilaterally. Radial, PT, and DP pulses are 2+ bilaterally. Skin: Warm and dry without rash.  EKG:  Normal sinus rhythm with right atrial enlargement. Otherwise, no significant abnormalities.  Lab Results  Component Value Date   WBC 5.5 12/13/2016   HGB 13.0 12/13/2016   HCT 39.3 12/13/2016   MCV 86 12/13/2016   PLT 222 12/13/2016    Lab Results  Component Value Date   NA 144 12/13/2016   K 4.2 12/13/2016   CL 101 12/13/2016   CO2 26 12/13/2016   BUN 22 12/13/2016   CREATININE 0.80 12/13/2016   GLUCOSE 80 12/13/2016    Lab Results  Component Value Date   LDLDIRECT 135 (H) 12/13/2016    --------------------------------------------------------------------------------------------------  ASSESSMENT AND PLAN:  History of congenitally malformed aortic valve status post bioprosthetic valve replacement Mr. Slifer is doing well without symptoms to suggest heart failure or valve dysfunction. Exam today is normal. Systolic murmur is not unexpected, given small size of implanted valve. Echocardiogram in November 2017 demonstrated reasonable gradient for this particular valve. We will continue with low-dose aspirin. Prophylactic antibiotic use was again recommended before any dental procedure. Her symptoms are unchanged, we will repeat echocardiogram as it has been 2 years already.  Coarctation of the aorta Good pedal pulses on exam today. No symptoms reported. Blood pressure is well controlled. No further workup at this time.  We will try to obtain gradients on echocardiogram.  Lower extremity edema This is likely multifactorial including venous insufficiency. I have given Ashley Torres a prescription for compression stockings. We discussed the utility of trying furosemide, but she wishes to defer this.  Hypertension Blood pressure is well controlled today. We will  continue with lisinopril and hydrochlorothiazide. Ashley Torres is scheduled for follow-up with her PCP next month and states that labs will be checked at that time.  Shortness of breath, that she attributes to her allergies, we will refill albuterol.  Follow-up: Return to clinic in 6 months.  Tobias AlexanderKatarina Jamielynn Wigley, MD 08/18/2017 1:42 PM

## 2017-08-27 ENCOUNTER — Ambulatory Visit: Payer: Medicare Other | Admitting: Cardiology

## 2017-09-01 ENCOUNTER — Ambulatory Visit (HOSPITAL_COMMUNITY): Payer: Medicare Other | Attending: Cardiology

## 2017-09-01 ENCOUNTER — Other Ambulatory Visit: Payer: Self-pay

## 2017-09-01 DIAGNOSIS — Z9889 Other specified postprocedural states: Secondary | ICD-10-CM | POA: Insufficient documentation

## 2017-09-01 DIAGNOSIS — I10 Essential (primary) hypertension: Secondary | ICD-10-CM | POA: Insufficient documentation

## 2017-09-01 DIAGNOSIS — R600 Localized edema: Secondary | ICD-10-CM | POA: Insufficient documentation

## 2017-09-01 DIAGNOSIS — I272 Pulmonary hypertension, unspecified: Secondary | ICD-10-CM | POA: Insufficient documentation

## 2017-09-01 DIAGNOSIS — E785 Hyperlipidemia, unspecified: Secondary | ICD-10-CM | POA: Insufficient documentation

## 2017-09-01 DIAGNOSIS — Q251 Coarctation of aorta: Secondary | ICD-10-CM | POA: Diagnosis not present

## 2017-09-01 DIAGNOSIS — Z952 Presence of prosthetic heart valve: Secondary | ICD-10-CM | POA: Insufficient documentation

## 2017-09-01 DIAGNOSIS — R609 Edema, unspecified: Secondary | ICD-10-CM | POA: Insufficient documentation

## 2017-09-02 ENCOUNTER — Telehealth: Payer: Self-pay | Admitting: *Deleted

## 2017-09-02 DIAGNOSIS — Z952 Presence of prosthetic heart valve: Secondary | ICD-10-CM

## 2017-09-02 DIAGNOSIS — Z8774 Personal history of (corrected) congenital malformations of heart and circulatory system: Secondary | ICD-10-CM

## 2017-09-02 DIAGNOSIS — Q231 Congenital insufficiency of aortic valve: Secondary | ICD-10-CM

## 2017-09-02 DIAGNOSIS — Q2381 Bicuspid aortic valve: Secondary | ICD-10-CM

## 2017-09-02 NOTE — Telephone Encounter (Signed)
-----   Message from Lars MassonKatarina H Nelson, MD sent at 09/02/2017  1:00 PM EDT ----- Normal LV function; s/p AVR with mildly elevated mean gradient of 21 mmHg unchanged from the echocardiogram in 2017. There are elevated velocity in descending aorta (2.7 m/s); suggest CTA or MRA to further assess coarctation.  Lajoyce Cornersvy, this can be read by radiology - its a chest CTA, not cardiac CTA

## 2017-09-02 NOTE — Telephone Encounter (Signed)
Spoke with the pt through the deaf interpreter line and endorsed to her echo results and recommendations per Dr Delton SeeNelson.  Informed the pt that per Dr Delton SeeNelson, we need to order for her to have a CT Angio Chest Aorta w/wo contrast, to further evaluate coarctation.  Informed the interpreter to endorse to the pt that I will place the order in the system and have a Scheduler call her back to arrange for this test to be done.  Interpreter endorsed this to the pt, she verbalized understanding and agrees with this plan.

## 2017-09-08 ENCOUNTER — Telehealth: Payer: Self-pay

## 2017-09-08 ENCOUNTER — Telehealth: Payer: Self-pay | Admitting: Family Medicine

## 2017-09-08 NOTE — Telephone Encounter (Signed)
Patient left message asking that PCP advise her on the following:  Patient is going to [place] for a week in August. Stated her allergens are much worse there than here. Does not want to be sick and worry her parents. Wants advice on the best OTC meds she can take to help prevent allergies while there and when to start taking them.  Also wanted to discuss if she should have any type of allergy shots for this.  Call back is [phone]  Ples SpecterAlisa Reizy Dunlow, RN [facility] RN)

## 2017-09-08 NOTE — Telephone Encounter (Signed)
Called patient. She has done well with allegra in the past so advised taking 180mg  qd, Also advised taking her rhinocort and prn albuterol with her. Patient voiced good understanding and appreciation.

## 2017-09-08 NOTE — Telephone Encounter (Signed)
Opened encounter in error  

## 2017-09-10 ENCOUNTER — Telehealth: Payer: Self-pay | Admitting: Cardiology

## 2017-09-10 ENCOUNTER — Inpatient Hospital Stay: Admission: RE | Admit: 2017-09-10 | Payer: Medicare Other | Source: Ambulatory Visit

## 2017-09-10 NOTE — Telephone Encounter (Signed)
Appt for her CT Angio of the Aorta is scheduled for 09/22/17 at 1130.

## 2017-09-10 NOTE — Telephone Encounter (Signed)
Notes recorded by Loa SocksMartin, Ivy M, LPN on 3/61/44316/18/2019 at 4:28 PM EDT Lars MassonNelson, Katarina H, MD Loa SocksMartin, Ivy M, LPN Cc: Yvonne KendallEnd, Christopher, MD    I guess nothing we can do, also she is a patient of Dr End      ------  Notes recorded by Loa SocksMartin, Ivy M, LPN on 5/40/08676/18/2019 at 4:12 PM EDT Stacy Snead CT Tech here at our office, she tried calling the pt through the interpreter line, to schedule her needed chest CTA, as ordered by Dr Delton SeeNelson.  Per Kennyth ArnoldStacy the pt is refusing to have this test done, for she states she had a bad experience with CT machines and needing an IV established.  Kennyth ArnoldStacy states that the pt endorsed to her that the last time she had a CT done they had to stick her several times to get an IV, and this made her hand swell. Stacy even offered for the pt to be scheduled with IV team, and pt still refused this option, and is adamant that she is NOT having this done.  Informed Kennyth ArnoldStacy that we will respect her wishes, and I will forward this message back to Dr Delton SeeNelson to make her aware of pt refusal for further testing.  Stacy verbalized understanding and agrees with this plan.

## 2017-09-10 NOTE — Telephone Encounter (Signed)
New Message   Patient is calling to discuss the CT that she cancelled. Please call. She ask that you call after 4pm.

## 2017-09-10 NOTE — Telephone Encounter (Signed)
Pt is now calling to schedule her CT Angio Aorta.  Pt is wanting this done in outpatient setting, here at our office.  [facility] [place]

## 2017-09-22 ENCOUNTER — Inpatient Hospital Stay: Admission: RE | Admit: 2017-09-22 | Payer: Medicare Other | Source: Ambulatory Visit

## 2017-09-25 ENCOUNTER — Encounter (INDEPENDENT_AMBULATORY_CARE_PROVIDER_SITE_OTHER): Payer: Self-pay

## 2017-09-25 ENCOUNTER — Telehealth: Payer: Self-pay | Admitting: *Deleted

## 2017-09-25 ENCOUNTER — Ambulatory Visit (INDEPENDENT_AMBULATORY_CARE_PROVIDER_SITE_OTHER)
Admission: RE | Admit: 2017-09-25 | Discharge: 2017-09-25 | Disposition: A | Discharge status: Home / Self Care | Payer: Medicare Other | Source: Ambulatory Visit | Attending: Cardiology | Admitting: Cardiology

## 2017-09-25 DIAGNOSIS — Q231 Congenital insufficiency of aortic valve: Secondary | ICD-10-CM | POA: Diagnosis not present

## 2017-09-25 DIAGNOSIS — Z952 Presence of prosthetic heart valve: Secondary | ICD-10-CM

## 2017-09-25 DIAGNOSIS — I712 Thoracic aortic aneurysm, without rupture: Secondary | ICD-10-CM | POA: Diagnosis not present

## 2017-09-25 DIAGNOSIS — Z8774 Personal history of (corrected) congenital malformations of heart and circulatory system: Secondary | ICD-10-CM

## 2017-09-25 IMAGING — CT CT ANGIO CHEST
2 of 7 series · 17 of 36 positions shown · IV contrast (iopamidol)
Comparison: No prior CT or MR imaging of the thoracic aorta for
comparison.

CLINICAL DATA: History of prior aortic coarctation repair in
childhood. Additional history aortic valve replacement in [6P].
Elevated velocity in the descending thoracic aorta by
echocardiography.

EXAM:
CT ANGIOGRAPHY CHEST WITH CONTRAST
TECHNIQUE: Multidetector CT imaging of the chest was performed using the
standard protocol during bolus administration of intravenous
contrast. Multiplanar CT image reconstructions and MIPs were
obtained to evaluate the vascular anatomy.
CONTRAST:  100mL [6P] IOPAMIDOL ([6P]) INJECTION 76%

[Series 4: aorta 3.0 i31f 2 · axial · 0.71mm/px · z∈[-281,-50]mm · 16 of 85 slices shown]
[im 4/85  lung]
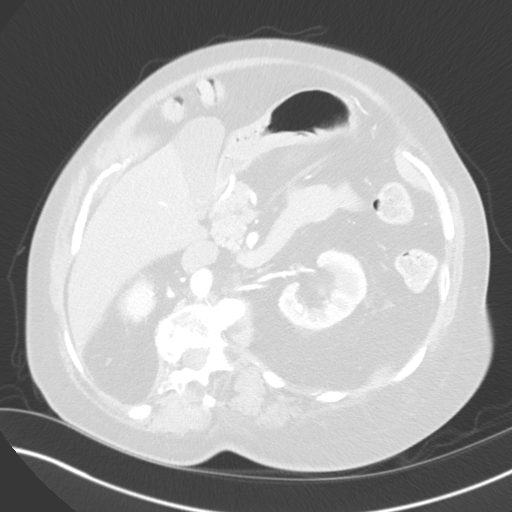
[im 11/85  mediastinal]
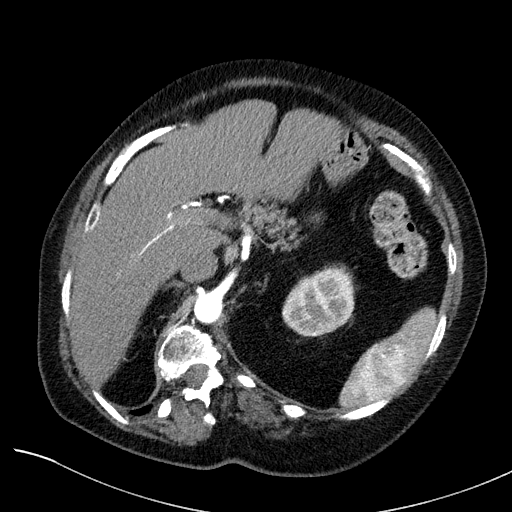
[im 15/85  lung]
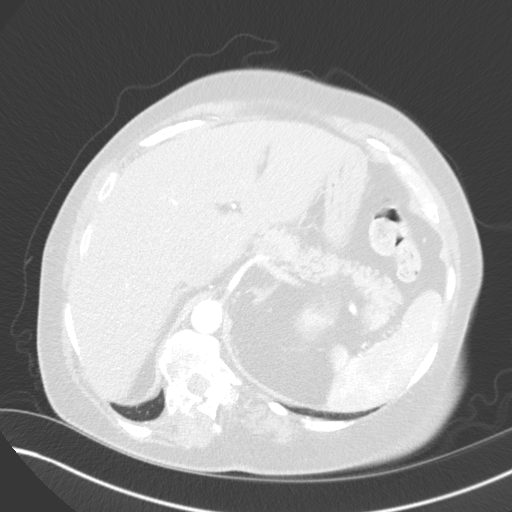
[im 22/85  mediastinal]
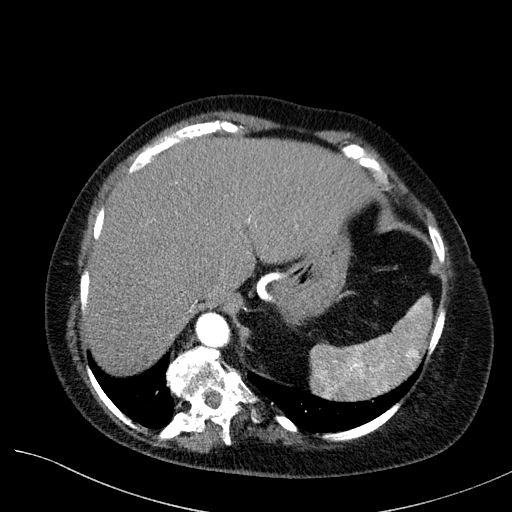
[im 25/85  lung]
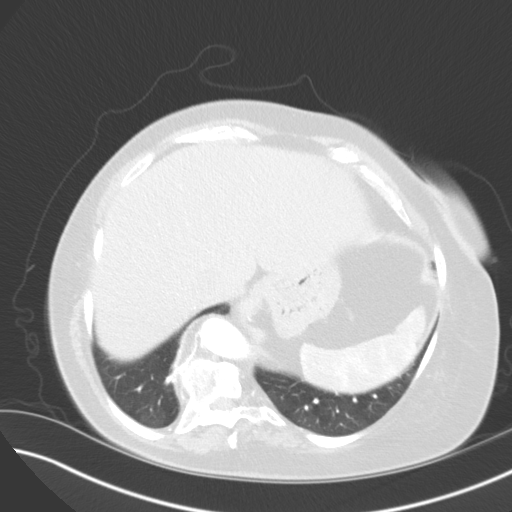
[im 29/85  mediastinal]
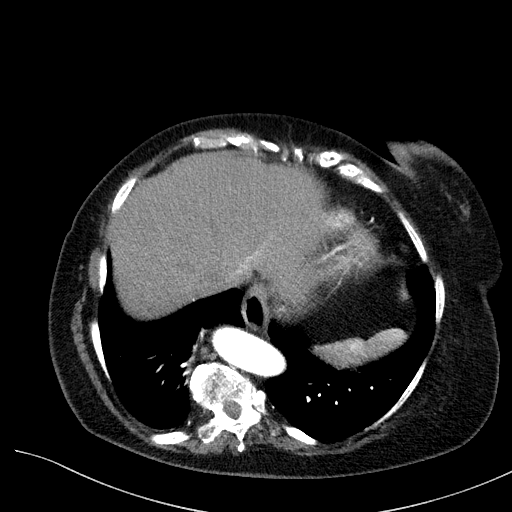
[im 36/85  lung]
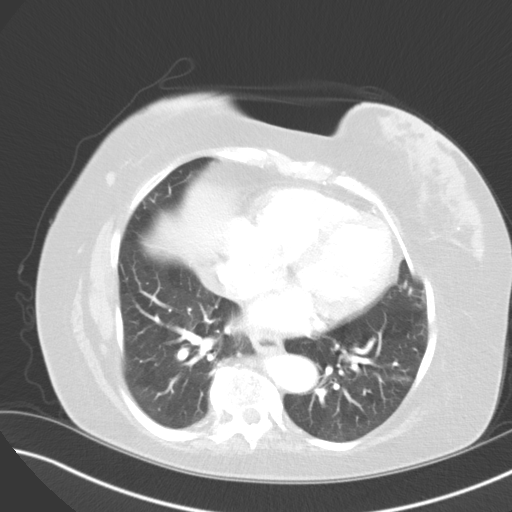
[im 39/85  mediastinal]
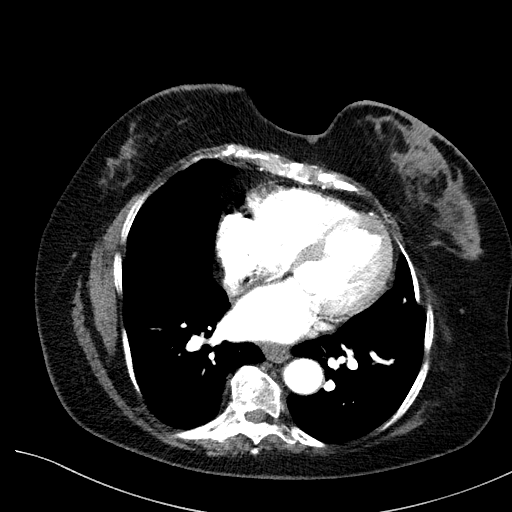
[im 46/85  lung]
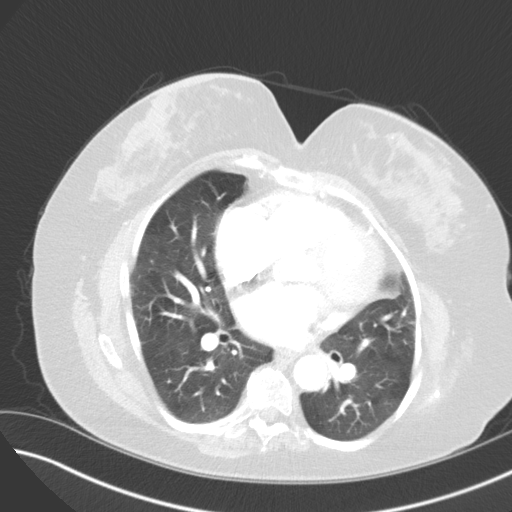
[im 50/85  mediastinal]
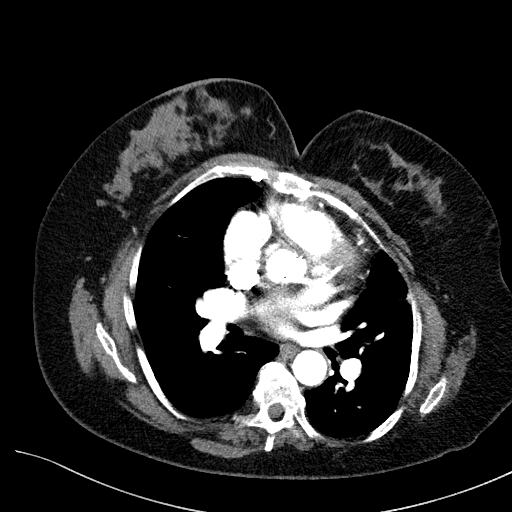
[im 57/85  lung]
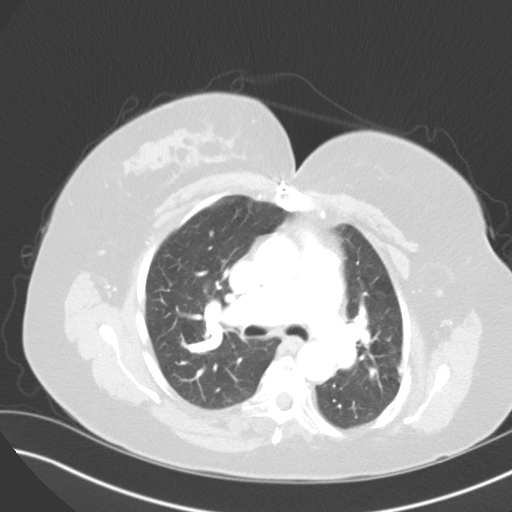
[im 60/85  mediastinal]
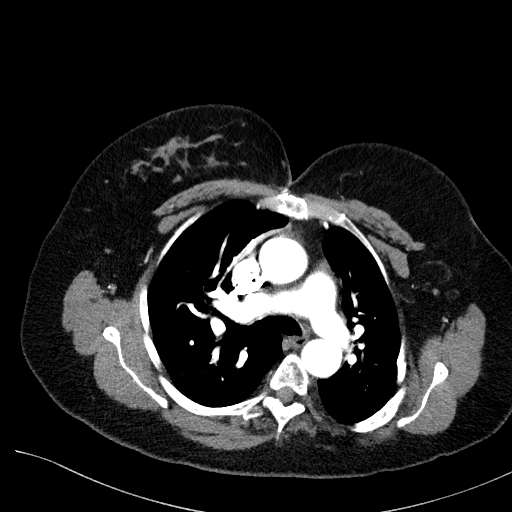
[im 64/85  lung]
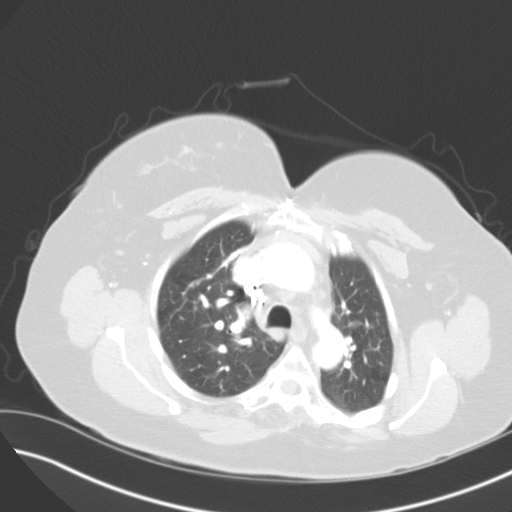
[im 71/85  mediastinal]
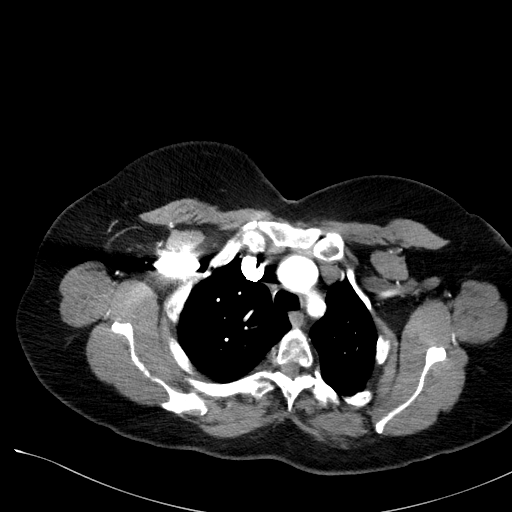
[im 74/85  lung]
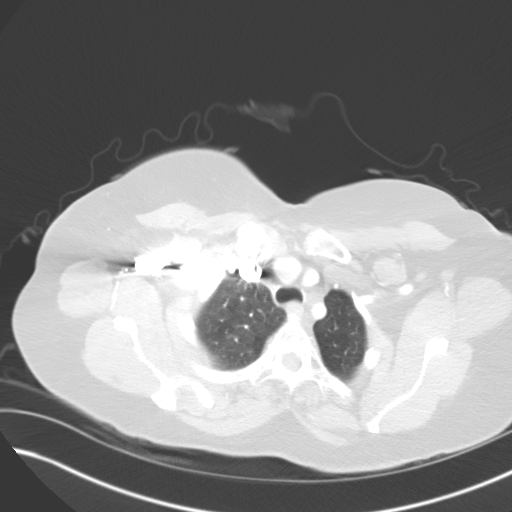
[im 81/85  mediastinal]
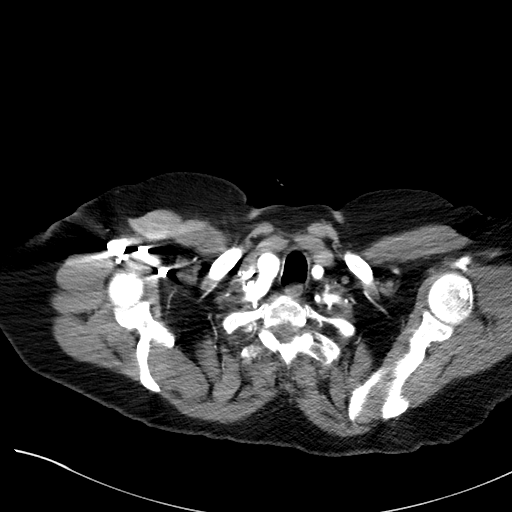

[Series 7: coronals · coronal · 0.51mm/px · 1 of 134 slices shown]
[im 67/134  mediastinal]
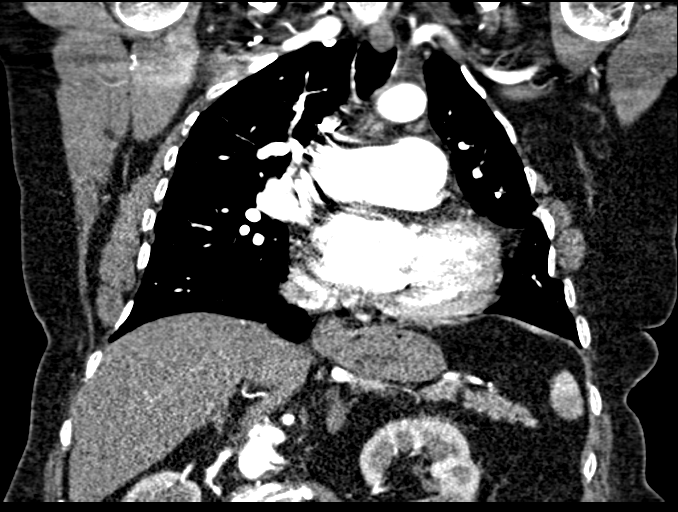

[17 of 36 positions shown; findings below may reference images not displayed]

FINDINGS: Cardiovascular: The aortic root appears normal at the level of a
prosthetic aortic valve. The ascending thoracic aorta is of normal
caliber and measures 3.3 cm. The proximal arch measures 2.9 cm.
Proximal great vessels show bovine branching anatomy and normal
patency. Just beyond the left subclavian artery origin, the aorta
shows smaller caliber and smoothly tapers down to a minimum luminal
diameter of approximately 13 mm. There is tortuosity at the level of
what appears to be coarct repair as well as a small focal
outpouching along the lateral superior aspect of the aorta at the
level of repair measuring roughly 12 x 10 x 7 mm. This appears to be
a small area of probable pseudo aneurysmal dilatation at the level
of coarct repair. Without prior imaging, there is no way to
determine stability and this can be followed in the future by CTA or
MRA.

The descending thoracic aorta measures 2.4 cm. The descending
thoracic aorta is also quite tortuous due to a significant
underlying scoliosis of the thoracic spine. No enlarged intercostal,
mammary or other collateral arteries are identified in the chest. No
evidence of aortic dissection, significant atherosclerosis or other
aortic pathology.

The heart size is at the upper limits of normal. No pericardial
fluid identified. No evidence of significant calcified coronary
arterial plaque. Central pulmonary arteries and visualized segmental
pulmonary arteries are of normal caliber and show normal patency.

Mediastinum/Nodes: No enlarged mediastinal, hilar, or axillary lymph
nodes. Thyroid gland, trachea, and esophagus demonstrate no
significant findings.

Lungs/Pleura: There is no evidence of pulmonary edema,
consolidation, pneumothorax, nodule or pleural fluid.

Upper Abdomen: No acute abnormality.

Musculoskeletal: The thoracic spine shows osteopenia and prominent
leftward convex curvature. There also is a prominent rightward
convex curvature of the thoracolumbar junction. No underlying bony
lesion or fracture is identified.

Review of the MIP images confirms the above findings.
IMPRESSION: There is some expected narrowing of the distal arch and proximal
descending thoracic aorta at the level of prior coarctation repair.
There also is focal outpouching of the aortic lumen at the level of
prior repair which may represent a small pseudoaneurysm. This can be
followed in the future by CTA or MRA. Elevated aortic velocity may
be secondary to the underlying smoothly tapered narrowing in
combination with significant tortuosity present. The lack of
significant collateral arteries may argue against a hemodynamically
significant recurrent stenosis at the level of prior repair.

## 2017-09-25 MED ORDER — IOPAMIDOL (ISOVUE-370) INJECTION 76%
100.0000 mL | Freq: Once | INTRAVENOUS | Status: AC | PRN
  Administered 2017-09-25: 100 mL via INTRAVENOUS

## 2017-09-25 NOTE — Telephone Encounter (Signed)
Spoke with the pt (through the deaf interpreter line), and informed her of her CTA of the Aorta results and recommendations per Dr Delton SeeNelson.  Informed the pt that I will place the order for her to have her repeat CTA of the Aorta done in one year for follow-up, and then [facility] and [facility]uler a message to call her back and have this arranged. Pt verbalized understanding and agrees with this plan.

## 2017-09-25 NOTE — Telephone Encounter (Signed)
-----   Message from Lars MassonKatarina H Nelson, MD sent at 09/25/2017  4:18 PM EDT ----- There is some expected narrowing of the distal arch and proximal descending thoracic aorta at the level of prior coarctation repair. However this is not significant. We will follow with a CTA in 1 year.

## 2017-09-29 ENCOUNTER — Telehealth: Payer: Self-pay | Admitting: *Deleted

## 2017-09-29 DIAGNOSIS — Z952 Presence of prosthetic heart valve: Secondary | ICD-10-CM

## 2017-09-29 DIAGNOSIS — Q231 Congenital insufficiency of aortic valve: Secondary | ICD-10-CM

## 2017-09-29 DIAGNOSIS — I10 Essential (primary) hypertension: Secondary | ICD-10-CM

## 2017-09-29 DIAGNOSIS — Z8774 Personal history of (corrected) congenital malformations of heart and circulatory system: Secondary | ICD-10-CM

## 2017-09-29 NOTE — Telephone Encounter (Signed)
pts BMET order placed for 09/2018.   Will place a note for a deaf interpreter to be present for both of pts scheduled appts.

## 2017-09-29 NOTE — Telephone Encounter (Signed)
-----   Message from Rayfield CitizenStacey J Snead, [place] sent at 09/29/2017 11:42 AM EDT ----- Regarding: RE: needs repeat CTA of Aorta done in one year per Dr Cleotilde NeerNelson Ivy   [facility] will you put her down for Lab draw on September 24 2018 she wants an interpreter here for the lab draw? Pt is aware of both appts.   Thank you  Misty StanleyStacey  ----- Message ----- From: Loa SocksMartin, Ivy M, LPN Sent: 7/82/95627/01/2018   5:51 PM To: Toy CareStacey J Snead, NT, [facility] Subject: needs repeat CTA of Aorta done in one year p#  Per Dr Delton SeeNelson, this pt needs a repeat CT Angio of the Aorta done in ONE YEAR, for follow-up of history of coarctation of aorta repair and recent results from her CT Angio she had done in our office. Order is in the system and she is aware that you will call to arrange for one year out. She is deaf, so this goes through the interpreter line. Can you please schedule?  Thanks,  [employer]

## 2017-09-30 ENCOUNTER — Ambulatory Visit (INDEPENDENT_AMBULATORY_CARE_PROVIDER_SITE_OTHER): Payer: Medicare Other | Admitting: Family Medicine

## 2017-09-30 ENCOUNTER — Encounter: Payer: Self-pay | Admitting: Family Medicine

## 2017-09-30 ENCOUNTER — Other Ambulatory Visit: Payer: Self-pay

## 2017-09-30 VITALS — BP 118/64 | HR 71 | Temp 97.6°F | Ht <= 58 in | Wt 177.0 lb

## 2017-09-30 DIAGNOSIS — E039 Hypothyroidism, unspecified: Secondary | ICD-10-CM

## 2017-09-30 DIAGNOSIS — L821 Other seborrheic keratosis: Secondary | ICD-10-CM | POA: Diagnosis not present

## 2017-09-30 DIAGNOSIS — E785 Hyperlipidemia, unspecified: Secondary | ICD-10-CM

## 2017-09-30 DIAGNOSIS — Z0000 Encounter for general adult medical examination without abnormal findings: Secondary | ICD-10-CM | POA: Insufficient documentation

## 2017-09-30 DIAGNOSIS — Z0001 Encounter for general adult medical examination with abnormal findings: Secondary | ICD-10-CM | POA: Diagnosis not present

## 2017-09-30 DIAGNOSIS — M791 Myalgia, unspecified site: Secondary | ICD-10-CM | POA: Diagnosis not present

## 2017-09-30 DIAGNOSIS — I10 Essential (primary) hypertension: Secondary | ICD-10-CM

## 2017-09-30 MED ORDER — LISINOPRIL 20 MG PO TABS: 20.0000 mg | ORAL_TABLET | Freq: Every day | ORAL | 3 refills | Status: DC

## 2017-09-30 MED ORDER — LEVOTHYROXINE SODIUM 100 MCG PO TABS: ORAL_TABLET | ORAL | 3 refills | Status: DC

## 2017-09-30 MED ORDER — HYDROCHLOROTHIAZIDE 25 MG PO TABS: 25.0000 mg | ORAL_TABLET | Freq: Every day | ORAL | 3 refills | Status: DC

## 2017-09-30 NOTE — Assessment & Plan Note (Signed)
Patient is up-to-date other than her Pap smear which she will return for in 2 months

## 2017-09-30 NOTE — Assessment & Plan Note (Addendum)
Stable, controlled at goal.  Continue current management of lisinopril 20 mg daily hydrochlorothiazide 25 mg.  Check BMP.

## 2017-09-30 NOTE — Assessment & Plan Note (Signed)
Stable.  Continue levothyroxine 100 mcg daily.  Recheck TSH today

## 2017-09-30 NOTE — Assessment & Plan Note (Signed)
Patient has historically done well on lifestyle modifications with regular exercise.  She is not on a statin due to intolerance.  Continue red yeast rice.  Check lipid panel today.

## 2017-09-30 NOTE — Assessment & Plan Note (Signed)
Patient continues to have multiple benign seborrheic keratoses on her back which are unchanged from last visit.  She has a new benign seborrheic keratosis on her inner right upper arm.  Patient reassured.

## 2017-09-30 NOTE — Progress Notes (Signed)
Subjective:  Ashley Torres is a 62 y.o. female [facility]  History taken via in person sign language interpreter  HPI:  Muscle ache Doing well overall. Has been exercising regularly and was doing 25lb dead lifts wither her legs and her R hip/groin area has been bothering her a little bit since. Walking okay, just some muscle aches.  Edematous today that she has been on her feet more.  She has compression stockings at home the third 30 so she has been waiting to wash them before putting the mask on.  She saw her cardiologist recently who wants yearly blood work.   HTN Compliant on her home lisinopril 20 mg daily and hydrochlorothiazide 25 mg daily, tolerating well.  Hypothyroidism Compliant on her home levothyroxine 100 mcg daily, tolerating well  Mole Has noticed a new skin mole under her right upper arm.  No bleeding.  No change in pigmentation  ROS: Per HPI, otherwise all systems reviewed and negative  PMH:  The following were reviewed and entered/updated in epic: Past Medical History:  Diagnosis Date  . Allergic rhinitis   . Aphakia of left eye   . Chronic back pain 2015  . Chronic sinusitis   . Deafness congenital   . Drusen of right optic disc   . Environmental and seasonal allergies   . Gastric stress ulcer   . GERD (gastroesophageal reflux disease) 1990  . Hyperlipidemia 1997  . Hypertension 1997  . Hypothyroidism   . Nuclear sclerosis of right eye   . Obesity   . Rubella syndrome, congenital   . S/P AVR (aortic valve replacement)   . Scoliosis   . Uterine prolapse   . Vision impairment   . Vision loss, left eye    Patient Active Problem List   Diagnosis Date Noted  . Healthcare maintenance 09/30/2017  . Seborrheic keratoses 12/13/2016  . Bicuspid aortic valve 04/21/2016  . H/O aortic valve replacement 01/13/2016  . H/O coarctation of aorta 01/13/2016  . HTN (hypertension) 01/13/2016  . Cervical prolapse  09/14/2014  . Nuclear sclerosis of right eye 06/29/2014  . Aphakia of left eye 06/29/2014  . Deafness congenital 10/28/2013  . Drusen of right optic disc 10/19/2013  . Rubella syndrome, congenital 07/18/2013  . Obesity, Class II, BMI 35-39.9 07/17/2013  . Vision impairment 07/17/2013  . Scoliosis 07/17/2013  . Hypothyroidism (acquired) 07/17/2013  . HLD (hyperlipidemia) 07/17/2013  . GERD (gastroesophageal reflux disease) 07/17/2013  . Environmental and seasonal allergies 07/17/2013  . Back pain 07/17/2013   Past Surgical History:  Procedure Laterality Date  . AORTIC VALVE REPLACEMENT  2017  . BREAST EXCISIONAL BIOPSY Left   . CARDIAC CATHETERIZATION    . COARCTATION OF AORTA REPAIR     repaired at age 89  . THYROID SURGERY  1997    Family History  Problem Relation Age of Onset  . Other Mother        killed in gas explosion  . Asthma Mother   . Heart murmur Father   . Arthritis Father   . Hyperlipidemia Father   . Arthritis Brother   . Sinusitis Brother   . Heart disease Neg Hx      Social History   Socioeconomic History  . Marital status: Married    Spouse name: Not on file  . Number of children: Not on file  . Years of education: some college  . Highest education level: Not on file  Occupational  History    Comment: unemployed  Social Needs  . Financial resource strain: Not on file  . Food insecurity:    Worry: Not on file    Inability: Not on file  . Transportation needs:    Medical: Not on file    Non-medical: Not on file  Tobacco Use  . Smoking status: Never Smoker  . Smokeless tobacco: Never Used  Substance and Sexual Activity  . Alcohol use: No  . Drug use: No  . Sexual activity: Never  Lifestyle  . Physical activity:    Days per week: Not on file    Minutes per session: Not on file  . Stress: Not on file  Relationships  . Social connections:    Talks on phone: Not on file    Gets together: Not on file    Attends religious service: Not on  file    Active member of club or organization: Not on file    Attends meetings of clubs or organizations: Not on file    Relationship status: Not on file  Other Topics Concern  . Not on file  Social History Narrative   Lives alone     Objective:  Physical Exam: BP 118/64   Pulse 71   Temp 97.6 F (36.4 C) (Oral)   Ht 4\' 9"  (1.448 m)   Wt 177 lb (80.3 kg)   LMP  (LMP Unknown)   SpO2 98%   BMI 38.30 kg/m   Gen: NAD, resting comfortably CV: RRR with no murmurs appreciated Pulm: NWOB, CTAB with no crackles, wheezes, or rhonchi GI: Normal bowel sounds present. Soft, Nontender, Nondistended. MSK: no edema, cyanosis, or clubbing noted Skin: warm, dry.  Multiple brown waxy stuck on appearing macules diffusely on back in 1 additional small stuck on macule at right inner arm Psych: Normal affect and thought content   Assessment/Plan:  HTN (hypertension) Stable, controlled at goal.  Continue current management of lisinopril 20 mg daily hydrochlorothiazide 25 mg.  Check BMP.  Hypothyroidism (acquired) Stable.  Continue levothyroxine 100 mcg daily.  Recheck TSH today  HLD (hyperlipidemia) Patient has historically done well on lifestyle modifications with regular exercise.  She is not on a statin due to intolerance.  Continue red yeast rice.  Check lipid panel today.  Seborrheic keratoses Patient continues to have multiple benign seborrheic keratoses on her back which are unchanged from last visit.  She has a new benign seborrheic keratosis on her inner right upper arm.  Patient reassured.  Healthcare maintenance Patient is up-to-date other than her Pap smear which she will return for in 2 months  Muscle ache Secondary to patient getting an vigorous exercise.  Patient was reassured and advised conservative management with stretching and as needed ibuprofen over the next couple of days.  Leland Her, DO PGY-3, [facility] 09/30/2017 1:57 PM

## 2017-09-30 NOTE — Patient Instructions (Signed)
It was good to see you today!  For your muscle aches, take it easy for the next few days. You can take ibuprofen as you need it. Good stretching and you should feel better soon.   Blood pressure looks good, I have sent refills to your pharmacy. Please let me know if you need more refills.  Come back in 2 months for your pap smear and blood work.  Please bring all of your medications with you to each visit.   Sign up for My Chart to have easy access to your labs results, and communication with your primary care physician.  Feel free to call with any questions or concerns at any time, at [phone].   Take care,  Dr. Leland HerElsia J Annaleise Burger, DO [facility]

## 2017-10-01 ENCOUNTER — Encounter: Payer: Self-pay | Admitting: Family Medicine

## 2017-10-01 LAB — BASIC METABOLIC PANEL
BUN / CREAT RATIO: 29 — AB (ref 9–23)
BUN: 21 mg/dL (ref 6–24)
CO2: 26 mmol/L (ref 20–29)
CREATININE: 0.72 mg/dL (ref 0.57–1.00)
Calcium: 10.7 mg/dL — ABNORMAL HIGH (ref 8.7–10.2)
Chloride: 100 mmol/L (ref 96–106)
GFR, EST AFRICAN AMERICAN: 106 mL/min/{1.73_m2} (ref >59)
GFR, EST NON AFRICAN AMERICAN: 92 mL/min/{1.73_m2} (ref >59)
Glucose: 78 mg/dL (ref 65–99)
Potassium: 4.5 mmol/L (ref 3.5–5.2)
SODIUM: 142 mmol/L (ref 134–144)

## 2017-10-01 LAB — CBC
HEMATOCRIT: 39.1 % (ref 34.0–46.6)
HEMOGLOBIN: 12.8 g/dL (ref 11.1–15.9)
MCH: 28.7 pg (ref 26.6–33.0)
MCHC: 32.7 g/dL (ref 31.5–35.7)
MCV: 88 fL (ref 79–97)
Platelets: 236 10*3/uL (ref 150–450)
RBC: 4.46 x10E6/uL (ref 3.77–5.28)
RDW: 14 % (ref 12.3–15.4)
WBC: 6.5 10*3/uL (ref 3.4–10.8)

## 2017-10-01 LAB — TSH: TSH: 2.18 u[IU]/mL (ref 0.450–4.500)

## 2017-10-01 LAB — LIPID PANEL
Chol/HDL Ratio: 4.3 ratio (ref 0.0–4.4)
Cholesterol, Total: 234 mg/dL — ABNORMAL HIGH (ref 100–199)
HDL: 54 mg/dL (ref >39)
LDL CALC: 140 mg/dL — AB (ref 0–99)
Triglycerides: 202 mg/dL — ABNORMAL HIGH (ref 0–149)
VLDL CHOLESTEROL CAL: 40 mg/dL (ref 5–40)

## 2017-10-20 ENCOUNTER — Telehealth: Payer: Self-pay | Admitting: *Deleted

## 2017-10-20 NOTE — Telephone Encounter (Signed)
Pt states that she is taking allegra but she is having issues with "a lot of phlegm"  She wants to know if she should be taking the Allegra D and if its ok with her other medications.  Will forward to MD to advise. Fleeger, Ashley RochesterJessica Azhane, CMA

## 2017-10-21 NOTE — Telephone Encounter (Signed)
Attempted to call patient but she was on a mobile device and the interpreter said that she was not able to see the patient clearly enough to communicate with sign language.

## 2017-10-21 NOTE — Telephone Encounter (Signed)
Patient called back and advise to not take allegra D. She actually picked up claritin which is working somewhat for her but she is still having some runny nose from her allergies. Advised increasing rhincort from 1 spray to 2 sprays a day during the acute flare.

## 2017-11-12 DIAGNOSIS — M25551 Pain in right hip: Secondary | ICD-10-CM | POA: Diagnosis not present

## 2017-11-12 DIAGNOSIS — M545 Low back pain: Secondary | ICD-10-CM | POA: Diagnosis not present

## 2017-12-02 ENCOUNTER — Ambulatory Visit: Payer: Medicare Other | Admitting: Family Medicine

## 2017-12-23 ENCOUNTER — Telehealth: Payer: Self-pay | Admitting: *Deleted

## 2017-12-23 DIAGNOSIS — K219 Gastro-esophageal reflux disease without esophagitis: Secondary | ICD-10-CM

## 2017-12-23 MED ORDER — FAMOTIDINE 20 MG PO TABS: 20.0000 mg | ORAL_TABLET | Freq: Two times a day (BID) | ORAL | 3 refills | Status: DC

## 2017-12-23 NOTE — Telephone Encounter (Signed)
Pt called nurse line.  She has heard on facebook and the news that ranitidine has been linked to cancer.  She wants to know if she should change the medication.  She is currently taking ranitidine 150mg  BID.  She wonders if she could use ginger root or apple cider vinegar.    Pt gives permission to lm on vm. Fleeger, Maryjo Rochester, CMA

## 2017-12-23 NOTE — Telephone Encounter (Signed)
Called patient with phone interpreter. Discussed stopping ranitidine. Recommended famotidine 20mg  BID instead.

## 2018-01-07 ENCOUNTER — Ambulatory Visit (INDEPENDENT_AMBULATORY_CARE_PROVIDER_SITE_OTHER): Payer: Medicare Other | Admitting: Family Medicine

## 2018-01-07 ENCOUNTER — Encounter: Payer: Self-pay | Admitting: Family Medicine

## 2018-01-07 ENCOUNTER — Other Ambulatory Visit (HOSPITAL_COMMUNITY)
Admission: RE | Admit: 2018-01-07 | Discharge: 2018-01-07 | Disposition: A | Discharge status: Home / Self Care | Payer: Medicare Other | Source: Ambulatory Visit | Attending: Family Medicine | Admitting: Family Medicine

## 2018-01-07 VITALS — BP 128/90 | HR 72 | Temp 97.8°F | Ht <= 58 in | Wt 177.0 lb

## 2018-01-07 DIAGNOSIS — E039 Hypothyroidism, unspecified: Secondary | ICD-10-CM

## 2018-01-07 DIAGNOSIS — Z124 Encounter for screening for malignant neoplasm of cervix: Secondary | ICD-10-CM | POA: Diagnosis not present

## 2018-01-07 DIAGNOSIS — Z0000 Encounter for general adult medical examination without abnormal findings: Secondary | ICD-10-CM

## 2018-01-07 DIAGNOSIS — J3089 Other allergic rhinitis: Secondary | ICD-10-CM | POA: Diagnosis not present

## 2018-01-07 DIAGNOSIS — I10 Essential (primary) hypertension: Secondary | ICD-10-CM | POA: Diagnosis not present

## 2018-01-07 MED ORDER — LEVOTHYROXINE SODIUM 100 MCG PO TABS: ORAL_TABLET | ORAL | 3 refills | Status: DC

## 2018-01-07 MED ORDER — ALBUTEROL SULFATE 108 (90 BASE) MCG/ACT IN AEPB: 1.0000 | INHALATION_SPRAY | RESPIRATORY_TRACT | 2 refills | Status: DC | PRN

## 2018-01-07 MED ORDER — LISINOPRIL 20 MG PO TABS: 20.0000 mg | ORAL_TABLET | Freq: Every day | ORAL | 3 refills | Status: DC

## 2018-01-07 NOTE — Progress Notes (Signed)
    Subjective:  Ashley Torres is a 62 y.o. female who presents to the [facility] today for pap smear and med refills. History taken via in person sign language interpreter  HPI:  Wants a pap smear today.  Allergies Have been worse than usual recently, still taking allergra and rhinocort but still needs albuterol inhaler intermittently. Seems to need to inhaler more when her allergies are acting up causing some wheezing. Needs a refill today Never had asthma before and no fam hx of asthma. Today is a good day for her and she has not needed her inhaler. No SOB or wheezing or congestion. No recent illnesses  Hypertension  Needs a refill on her lisinopril.  She takes lisinopril 20 mg daily, 25 mg daily ROS: taking medications as instructed, no medication side effects noted, no TIA's, no chest pain on exertion, no dyspnea on exertion and no swelling of ankles.   Hypothyroidism Takes levothyroxine micrograms daily.  Compliant all the time tolerating well.  She needs refills of the   ROS: Per HPI  Social Hx: She reports that she has never smoked. She has never used smokeless tobacco. She reports that she does not drink alcohol or use drugs.   Objective:  Physical Exam: BP 128/90   Pulse 72   Temp 97.8 F (36.6 C) (Oral)   Ht 4\' 9"  (1.448 m)   Wt 177 lb (80.3 kg)   LMP  (LMP Unknown)   SpO2 98%   BMI 38.30 kg/m   Gen: NAD, resting comfortably CV: Regular rate, systolic murmur noted Pulm: NWOB, CTAB with no crackles, wheezes, or rhonchi GI: Normal bowel sounds present. Soft, Nontender, Nondistended. MSK: no edema, cyanosis, or clubbing noted Pelvic exam: normal external genitalia, vulva, vagina, cervix Skin: warm, dry Psych: Normal affect and thought content  Assessment/Plan:  HTN (hypertension) Stable and controlled, continue current management.  Refill of lisinopril sent to patient pharmacy today.  Hypothyroidism (acquired) Stable and doing well, last TSH within normal  limits.  Continue current management.  Refill levothyroxine sent to patient pharmacy today  Healthcare maintenance Pap smear obtained today.  Environmental and seasonal allergies Patient is having more allergy symptoms with what she describes wheezing requiring more frequent use of albuterol inhaler.  I wonder if she has had a component of undiagnosed mild asthma that could be contributing.  Patient instructed to obtain PFTs.  Refill of albuterol inhaler given today.  If her PFTs are positive she may benefit from as needed symbicort   Leland Her, DO PGY-3, [facility] 01/07/2018 2:28 PM

## 2018-01-07 NOTE — Assessment & Plan Note (Signed)
Pap smear obtained today. 

## 2018-01-07 NOTE — Assessment & Plan Note (Signed)
Stable and controlled, continue current management.  Refill of lisinopril sent to patient pharmacy today.

## 2018-01-07 NOTE — Assessment & Plan Note (Addendum)
Stable and doing well, last TSH within normal limits.  Continue current management.  Refill levothyroxine sent to patient pharmacy today

## 2018-01-07 NOTE — Patient Instructions (Addendum)
Please make and appointment with our pharmacy clinic for PFTs (pulmonary function tests).  We are checking a pap smear today. If results require attention, either myself or my nurse will get in touch with you. If everything is normal, you will get a letter in the mail or a message in My Chart. Please give [place] a call if you do not hear from [place] after 2 weeks.   I have sent refills of your blood pressure and thyroid medication   Feel free to call with any questions or concerns at any time, at [phone].   Take care,  Dr. Leland Her, DO [facility]

## 2018-01-07 NOTE — Assessment & Plan Note (Addendum)
Patient is having more allergy symptoms with what she describes wheezing requiring more frequent use of albuterol inhaler.  I wonder if she has had a component of undiagnosed mild asthma that could be contributing.  Patient instructed to obtain PFTs.  Refill of albuterol inhaler given today.  If her PFTs are positive she may benefit from as needed symbicort

## 2018-01-12 LAB — CYTOLOGY - PAP
Diagnosis: NEGATIVE
HPV (WINDOPATH): NOT DETECTED

## 2018-01-13 ENCOUNTER — Other Ambulatory Visit: Payer: Self-pay | Admitting: Physician Assistant

## 2018-01-13 ENCOUNTER — Other Ambulatory Visit: Payer: Self-pay | Admitting: Family Medicine

## 2018-01-13 ENCOUNTER — Other Ambulatory Visit: Payer: Self-pay | Admitting: Internal Medicine

## 2018-01-13 DIAGNOSIS — Z1231 Encounter for screening mammogram for malignant neoplasm of breast: Secondary | ICD-10-CM

## 2018-01-28 ENCOUNTER — Encounter: Payer: Self-pay | Admitting: Cardiology

## 2018-01-29 ENCOUNTER — Ambulatory Visit: Payer: Medicare Other | Admitting: Pharmacist

## 2018-01-29 ENCOUNTER — Telehealth: Payer: Self-pay | Admitting: Family Medicine

## 2018-01-29 ENCOUNTER — Ambulatory Visit: Payer: Medicare Other

## 2018-01-29 NOTE — Telephone Encounter (Signed)
Pt would like for someone to call her with the results from last appointment on 01/07/18.

## 2018-01-29 NOTE — Telephone Encounter (Addendum)
Called pt to inform of normal pap results. Pt reported she is feeling under the weather and feels like she may be getting a sinus infection. I scheduled her for access to care.

## 2018-02-01 NOTE — Progress Notes (Deleted)
  Subjective:   Patient ID: Ashley Torres    DOB: 1955-05-10, 62 y.o. female   MRN: [id number]  Ashley Torres is a 62 y.o. female here for   Sinus trouble - seen 10/23 for worsened allergy symptoms. Refilled albuterol inhaler, instructed to get PFTs.  Review of Systems:  Per HPI.  PMFSH, medications and smoking status reviewed.  Past Medical History:  Diagnosis Date  . Allergic rhinitis   . Aphakia of left eye   . Chronic back pain 2015  . Chronic sinusitis   . Deafness congenital   . Drusen of right optic disc   . Environmental and seasonal allergies   . Gastric stress ulcer   . GERD (gastroesophageal reflux disease) 1990  . Hyperlipidemia 1997  . Hypertension 1997  . Hypothyroidism   . Nuclear sclerosis of right eye   . Obesity   . Rubella syndrome, congenital   . S/P AVR (aortic valve replacement)   . Scoliosis   . Uterine prolapse   . Vision impairment   . Vision loss, left eye     Objective:   LMP  (LMP Unknown)  Vitals and nursing note reviewed.  General: well nourished, well developed, in no acute distress with non-toxic appearance HEENT: normocephalic, atraumatic, moist mucous membranes Neck: supple, non-tender without lymphadenopathy CV: regular rate and rhythm without murmurs, rubs, or gallops, no lower extremity edema Lungs: clear to auscultation bilaterally with normal work of breathing Abdomen: soft, non-tender, non-distended, no masses or organomegaly palpable, normoactive bowel sounds Skin: warm, dry, no rashes or lesions Extremities: warm and well perfused, normal tone MSK: ROM grossly intact, strength intact, gait normal Neuro: Alert and oriented, speech normal  Assessment & Plan:   No problem-specific Assessment & Plan notes found for this encounter.  No orders of the defined types were placed in this encounter.  No orders of the defined types were placed in this encounter.   Ellwood DenseAlison Rumball, DO PGY-2, [facility] 02/01/2018 2:28 PM

## 2018-02-02 ENCOUNTER — Ambulatory Visit: Payer: Medicare Other

## 2018-02-18 DIAGNOSIS — H5201 Hypermetropia, right eye: Secondary | ICD-10-CM | POA: Diagnosis not present

## 2018-02-19 ENCOUNTER — Ambulatory Visit: Payer: Medicare Other

## 2018-02-19 ENCOUNTER — Ambulatory Visit: Payer: Medicare Other | Admitting: Pharmacist

## 2018-02-27 ENCOUNTER — Encounter (HOSPITAL_COMMUNITY): Payer: Self-pay | Admitting: Emergency Medicine

## 2018-02-27 ENCOUNTER — Ambulatory Visit: Payer: Medicare Other | Admitting: Cardiology

## 2018-02-27 ENCOUNTER — Emergency Department (HOSPITAL_COMMUNITY): Payer: Medicare Other

## 2018-02-27 ENCOUNTER — Emergency Department (HOSPITAL_COMMUNITY)
Admission: EM | Admit: 2018-02-27 | Discharge: 2018-02-27 | Disposition: A | Discharge status: Home / Self Care | Payer: Medicare Other | Attending: Emergency Medicine | Admitting: Emergency Medicine

## 2018-02-27 DIAGNOSIS — R51 Headache: Secondary | ICD-10-CM | POA: Diagnosis not present

## 2018-02-27 DIAGNOSIS — Z7982 Long term (current) use of aspirin: Secondary | ICD-10-CM | POA: Diagnosis not present

## 2018-02-27 DIAGNOSIS — I10 Essential (primary) hypertension: Secondary | ICD-10-CM | POA: Insufficient documentation

## 2018-02-27 DIAGNOSIS — Z743 Need for continuous supervision: Secondary | ICD-10-CM | POA: Diagnosis not present

## 2018-02-27 DIAGNOSIS — R112 Nausea with vomiting, unspecified: Secondary | ICD-10-CM | POA: Insufficient documentation

## 2018-02-27 DIAGNOSIS — R42 Dizziness and giddiness: Secondary | ICD-10-CM | POA: Insufficient documentation

## 2018-02-27 DIAGNOSIS — R0902 Hypoxemia: Secondary | ICD-10-CM | POA: Diagnosis not present

## 2018-02-27 DIAGNOSIS — Z79899 Other long term (current) drug therapy: Secondary | ICD-10-CM | POA: Insufficient documentation

## 2018-02-27 DIAGNOSIS — E039 Hypothyroidism, unspecified: Secondary | ICD-10-CM | POA: Diagnosis not present

## 2018-02-27 DIAGNOSIS — E785 Hyperlipidemia, unspecified: Secondary | ICD-10-CM | POA: Insufficient documentation

## 2018-02-27 DIAGNOSIS — R5381 Other malaise: Secondary | ICD-10-CM | POA: Diagnosis not present

## 2018-02-27 LAB — RAPID URINE DRUG SCREEN, HOSP PERFORMED
Amphetamines: NOT DETECTED
Barbiturates: NOT DETECTED
Benzodiazepines: NOT DETECTED
Cocaine: NOT DETECTED
Opiates: NOT DETECTED
Tetrahydrocannabinol: NOT DETECTED

## 2018-02-27 LAB — COMPREHENSIVE METABOLIC PANEL
ALT: 29 U/L (ref 0–44)
AST: 28 U/L (ref 15–41)
Albumin: 4 g/dL (ref 3.5–5.0)
Alkaline Phosphatase: 62 U/L (ref 38–126)
Anion gap: 13 (ref 5–15)
BUN: 27 mg/dL — ABNORMAL HIGH (ref 6–20)
CO2: 28 mmol/L (ref 22–32)
Calcium: 10 mg/dL (ref 8.9–10.3)
Chloride: 103 mmol/L (ref 98–111)
Creatinine, Ser: 1.03 mg/dL — ABNORMAL HIGH (ref 0.44–1.00)
Glucose, Bld: 114 mg/dL — ABNORMAL HIGH (ref 70–99)
Potassium: 4.3 mmol/L (ref 3.5–5.1)
Sodium: 144 mmol/L (ref 135–145)
Total Bilirubin: 0.6 mg/dL (ref 0.3–1.2)
Total Protein: 7.1 g/dL (ref 6.5–8.1)

## 2018-02-27 LAB — CBC
HCT: 43.4 % (ref 36.0–46.0)
Hemoglobin: 13.1 g/dL (ref 12.0–15.0)
MCH: 27.8 pg (ref 26.0–34.0)
MCHC: 30.2 g/dL (ref 30.0–36.0)
MCV: 91.9 fL (ref 80.0–100.0)
Platelets: 214 10*3/uL (ref 150–400)
RBC: 4.72 MIL/uL (ref 3.87–5.11)
RDW: 13.4 % (ref 11.5–15.5)
WBC: 6.4 10*3/uL (ref 4.0–10.5)
nRBC: 0 % (ref 0.0–0.2)

## 2018-02-27 LAB — DIFFERENTIAL
Abs Immature Granulocytes: 0.03 10*3/uL (ref 0.00–0.07)
Basophils Absolute: 0 10*3/uL (ref 0.0–0.1)
Basophils Relative: 0 %
Eosinophils Absolute: 0.1 10*3/uL (ref 0.0–0.5)
Eosinophils Relative: 2 %
Immature Granulocytes: 1 %
Lymphocytes Relative: 20 %
Lymphs Abs: 1.3 10*3/uL (ref 0.7–4.0)
Monocytes Absolute: 0.6 10*3/uL (ref 0.1–1.0)
Monocytes Relative: 9 %
Neutro Abs: 4.4 10*3/uL (ref 1.7–7.7)
Neutrophils Relative %: 68 %

## 2018-02-27 LAB — URINALYSIS, ROUTINE W REFLEX MICROSCOPIC
Bilirubin Urine: NEGATIVE
Glucose, UA: NEGATIVE mg/dL
Hgb urine dipstick: NEGATIVE
Ketones, ur: NEGATIVE mg/dL
Nitrite: NEGATIVE
Protein, ur: NEGATIVE mg/dL
Specific Gravity, Urine: 1.021 (ref 1.005–1.030)
pH: 6 (ref 5.0–8.0)

## 2018-02-27 LAB — I-STAT CHEM 8, ED
BUN: 28 mg/dL — ABNORMAL HIGH (ref 6–20)
Calcium, Ion: 1.26 mmol/L (ref 1.15–1.40)
Chloride: 105 mmol/L (ref 98–111)
Creatinine, Ser: 1 mg/dL (ref 0.44–1.00)
Glucose, Bld: 106 mg/dL — ABNORMAL HIGH (ref 70–99)
HCT: 39 % (ref 36.0–46.0)
Hemoglobin: 13.3 g/dL (ref 12.0–15.0)
Potassium: 4 mmol/L (ref 3.5–5.1)
Sodium: 142 mmol/L (ref 135–145)
TCO2: 31 mmol/L (ref 22–32)

## 2018-02-27 LAB — PROTIME-INR
INR: 0.9
Prothrombin Time: 12.1 seconds (ref 11.4–15.2)

## 2018-02-27 LAB — APTT: aPTT: 26 seconds (ref 24–36)

## 2018-02-27 LAB — I-STAT TROPONIN, ED: Troponin i, poc: 0 ng/mL (ref 0.00–0.08)

## 2018-02-27 MED ORDER — SODIUM CHLORIDE 0.9 % IV BOLUS
1000.0000 mL | Freq: Once | INTRAVENOUS | Status: AC
  Administered 2018-02-27: 1000 mL via INTRAVENOUS

## 2018-02-27 MED ORDER — LORAZEPAM 2 MG/ML IJ SOLN
0.5000 mg | Freq: Once | INTRAMUSCULAR | Status: DC
  Filled 2018-02-27: qty 1

## 2018-02-27 MED ORDER — METOCLOPRAMIDE HCL 5 MG/ML IJ SOLN
10.0000 mg | Freq: Once | INTRAMUSCULAR | Status: AC
  Administered 2018-02-27: 10 mg via INTRAVENOUS
  Filled 2018-02-27: qty 2

## 2018-02-27 MED ORDER — MECLIZINE HCL 25 MG PO TABS: 25.0000 mg | ORAL_TABLET | Freq: Three times a day (TID) | ORAL | 0 refills | Status: DC | PRN

## 2018-02-27 NOTE — ED Notes (Signed)
This RN went to MRI to speak w/ pt regarding her refusal of MRI and ativan; pt very worried about taking her home meds (she has w/ her in purse); called EDP to ask if pt can take home meds; pt to take home meds after MRI; pt states she is scared and clausterphobic; this RN explained to pt (through interpreter) that the MRI is needed and Ativan will help w/ claustrophobia; pt still refusing; EDP notified

## 2018-02-27 NOTE — ED Notes (Signed)
MRI called to say pt refusing ativan and MRI; EDP aware

## 2018-02-27 NOTE — ED Notes (Signed)
Pt ambulatory w/ steady gait.

## 2018-02-27 NOTE — ED Triage Notes (Addendum)
  Patient BIB EMS for N/V and abdominal pain.  Patient states she feels dizzy when she walks.  No complaints of pain.  4 episodes of emesis with last one in route from home.  Patient has had a small headache and her normal sinus issues.  Patient is A&O x4.  Patient is deaf but can read lips.  [employer] interpreter has been called.

## 2018-02-27 NOTE — ED Notes (Signed)
Paged deaf communications, Conita Amenta

## 2018-02-27 NOTE — Discharge Instructions (Signed)
Please take meclizine as needed for dizziness.  Drink plenty of fluid and follow up with neurology next week for further evaluation of your condition.  We were unable to perform an MRI of your brain today to rule out stroke as a cause of dizziness.  If your condition persists or worsen please return promptly.

## 2018-02-27 NOTE — ED Provider Notes (Signed)
Patient was signed out at the beginning of shift.  She is here with complaints of dizziness.  She also endorsed nausea and vomiting.  Initially she received treatment but did not report any significant improvement.  MRI of the brain and neck was ordered to rule out posterior circulation stroke.  Despite trying to reassure patient, she does not want to have an MRI at this time.  Patient has been monitored, her symptoms did improve significantly.  Upon ambulating, she was able to ambulate without significant discomfort, dizziness has mostly resolved.  She is no longer vomiting.  Given improvement of her symptoms, my suspicion for stroke is lower.  Since patient does not want brain MRI, plan to discharge home with anti-dizziness medication and outpatient follow-up with neurology for recommendation.  Patient voiced understanding and agrees with plan.  I was able to communicate with patient through writing.  She understands to return if her condition worsen.  BP (!) 152/78   Pulse (!) 107   Temp 97.9 F (36.6 C) (Oral)   Resp 19   LMP  (LMP Unknown)   SpO2 97%   Results for orders placed or performed during the hospital encounter of 02/27/18  Protime-INR  Result Value Ref Range   Prothrombin Time 12.1 11.4 - 15.2 seconds   INR 0.90   APTT  Result Value Ref Range   aPTT 26 24 - 36 seconds  CBC  Result Value Ref Range   WBC 6.4 4.0 - 10.5 K/uL   RBC 4.72 3.87 - 5.11 MIL/uL   Hemoglobin 13.1 12.0 - 15.0 g/dL   HCT 16.143.4 09.636.0 - 04.546.0 %   MCV 91.9 80.0 - 100.0 fL   MCH 27.8 26.0 - 34.0 pg   MCHC 30.2 30.0 - 36.0 g/dL   RDW 40.913.4 81.111.5 - 91.415.5 %   Platelets 214 150 - 400 K/uL   nRBC 0.0 0.0 - 0.2 %  Differential  Result Value Ref Range   Neutrophils Relative % 68 %   Neutro Abs 4.4 1.7 - 7.7 K/uL   Lymphocytes Relative 20 %   Lymphs Abs 1.3 0.7 - 4.0 K/uL   Monocytes Relative 9 %   Monocytes Absolute 0.6 0.1 - 1.0 K/uL   Eosinophils Relative 2 %   Eosinophils Absolute 0.1 0.0 - 0.5 K/uL   Basophils Relative 0 %   Basophils Absolute 0.0 0.0 - 0.1 K/uL   Immature Granulocytes 1 %   Abs Immature Granulocytes 0.03 0.00 - 0.07 K/uL  Comprehensive metabolic panel  Result Value Ref Range   Sodium 144 135 - 145 mmol/L   Potassium 4.3 3.5 - 5.1 mmol/L   Chloride 103 98 - 111 mmol/L   CO2 28 22 - 32 mmol/L   Glucose, Bld 114 (H) 70 - 99 mg/dL   BUN 27 (H) 6 - 20 mg/dL   Creatinine, Ser 7.821.03 (H) 0.44 - 1.00 mg/dL   Calcium 95.610.0 8.9 - 21.310.3 mg/dL   Total Protein 7.1 6.5 - 8.1 g/dL   Albumin 4.0 3.5 - 5.0 g/dL   AST 28 15 - 41 U/L   ALT 29 0 - 44 U/L   Alkaline Phosphatase 62 38 - 126 U/L   Total Bilirubin 0.6 0.3 - 1.2 mg/dL   GFR calc non Af Amer NOT CALCULATED >60 mL/min   GFR calc Af Amer NOT CALCULATED >60 mL/min   Anion gap 13 5 - 15  Urine rapid drug screen (hosp performed)  Result Value Ref Range   Opiates  NONE DETECTED NONE DETECTED   Cocaine NONE DETECTED NONE DETECTED   Benzodiazepines NONE DETECTED NONE DETECTED   Amphetamines NONE DETECTED NONE DETECTED   Tetrahydrocannabinol NONE DETECTED NONE DETECTED   Barbiturates NONE DETECTED NONE DETECTED  Urinalysis, Routine w reflex microscopic  Result Value Ref Range   Color, Urine YELLOW YELLOW   APPearance HAZY (A) CLEAR   Specific Gravity, Urine 1.021 1.005 - 1.030   pH 6.0 5.0 - 8.0   Glucose, UA NEGATIVE NEGATIVE mg/dL   Hgb urine dipstick NEGATIVE NEGATIVE   Bilirubin Urine NEGATIVE NEGATIVE   Ketones, ur NEGATIVE NEGATIVE mg/dL   Protein, ur NEGATIVE NEGATIVE mg/dL   Nitrite NEGATIVE NEGATIVE   Leukocytes, UA TRACE (A) NEGATIVE   RBC / HPF 0-5 0 - 5 RBC/hpf   WBC, UA 0-5 0 - 5 WBC/hpf   Bacteria, UA RARE (A) NONE SEEN   Squamous Epithelial / LPF 0-5 0 - 5   Mucus PRESENT    Hyaline Casts, UA PRESENT   I-Stat Chem 8, ED  Result Value Ref Range   Sodium 142 135 - 145 mmol/L   Potassium 4.0 3.5 - 5.1 mmol/L   Chloride 105 98 - 111 mmol/L   BUN 28 (H) 6 - 20 mg/dL   Creatinine, Ser 1.61 0.44 - 1.00  mg/dL   Glucose, Bld 096 (H) 70 - 99 mg/dL   Calcium, Ion 0.45 4.09 - 1.40 mmol/L   TCO2 31 22 - 32 mmol/L   Hemoglobin 13.3 12.0 - 15.0 g/dL   HCT 81.1 91.4 - 78.2 %  I-stat troponin, ED  Result Value Ref Range   Troponin i, poc 0.00 0.00 - 0.08 ng/mL   Comment 3           No results found.    Fayrene Helper, PA-C 02/27/18 1253    Gerhard Munch, MD 02/27/18 [phone]

## 2018-02-27 NOTE — ED Notes (Signed)
MRI called to say pt crying at MRI and needs ativan

## 2018-02-27 NOTE — ED Notes (Signed)
Patient transporting to MRI. Denies claustrophobia.

## 2018-02-27 NOTE — ED Notes (Signed)
Pt taken to MRI  

## 2018-02-27 NOTE — ED Provider Notes (Signed)
[facility] EMERGENCY DEPARTMENT Provider Note   CSN: [id number] Arrival date & time: 02/27/18  0326     History   Chief Complaint Chief Complaint  Patient presents with  . Abdominal Pain  . Emesis    HPI Ashley Torres is a 62 y.o. female with history of congenital deafness, hypertension, hypothyroidism, status post aortic valve replacement, chronic sinusitis who presents with dizziness that began yesterday and nausea and vomiting that began at 2 AM.  Patient reports she feels dizzy all the time, however it is worse when she walks.  She reports she feels like she is drunk when she is walking.  When she is standing still, she feels like the room is spinning.  She has a mild headache.  She vomited 4 times.  She denies any chest pain, shortness of breath.  No medications given prior to arrival.  HPI  Past Medical History:  Diagnosis Date  . Allergic rhinitis   . Aphakia of left eye   . Chronic back pain 2015  . Chronic sinusitis   . Deafness congenital   . Drusen of right optic disc   . Environmental and seasonal allergies   . Gastric stress ulcer   . GERD (gastroesophageal reflux disease) 1990  . Hyperlipidemia 1997  . Hypertension 1997  . Hypothyroidism   . Nuclear sclerosis of right eye   . Obesity   . Rubella syndrome, congenital   . S/P AVR (aortic valve replacement)   . Scoliosis   . Uterine prolapse   . Vision impairment   . Vision loss, left eye     Patient Active Problem List   Diagnosis Date Noted  . Healthcare maintenance 09/30/2017  . Seborrheic keratoses 12/13/2016  . Bicuspid aortic valve 04/21/2016  . H/O aortic valve replacement 01/13/2016  . H/O coarctation of aorta 01/13/2016  . HTN (hypertension) 01/13/2016  . Cervical prolapse 09/14/2014  . Nuclear sclerosis of right eye 06/29/2014  . Aphakia of left eye 06/29/2014  . Deafness congenital 10/28/2013  . Drusen of right optic disc 10/19/2013  . Rubella syndrome, congenital  07/18/2013  . Obesity, Class II, BMI 35-39.9 07/17/2013  . Vision impairment 07/17/2013  . Scoliosis 07/17/2013  . Hypothyroidism (acquired) 07/17/2013  . HLD (hyperlipidemia) 07/17/2013  . GERD (gastroesophageal reflux disease) 07/17/2013  . Environmental and seasonal allergies 07/17/2013  . Back pain 07/17/2013    Past Surgical History:  Procedure Laterality Date  . AORTIC VALVE REPLACEMENT  2017  . BREAST EXCISIONAL BIOPSY Left   . CARDIAC CATHETERIZATION    . COARCTATION OF AORTA REPAIR     repaired at age 66  . THYROID SURGERY  1997     OB History   No obstetric history on file.      Home Medications    Prior to Admission medications   Medication Sig Start Date End Date Taking? Authorizing Provider  acetaminophen (TYLENOL) 500 MG tablet Take 1,000 mg by mouth every 6 (six) hours as needed for moderate pain.    [provider]  Albuterol Sulfate (PROAIR RESPICLICK) 108 (90 Base) MCG/ACT AEPB Inhale 1 puff into the lungs every 4 (four) hours as needed. 01/07/18   Leland Her, DO  aspirin EC 81 MG tablet Take 81 mg by mouth daily.    [provider]  budesonide (RHINOCORT AQUA) 32 MCG/ACT nasal spray Place 1 spray into both nostrils daily as needed.    [provider]  Calcium Carb-Cholecalciferol [phone] MG-UNIT CAPS Take 2  tablets by mouth daily.    [provider]  calcium carbonate (TUMS - DOSED IN MG ELEMENTAL CALCIUM) 500 MG chewable tablet Chew 1 tablet by mouth 4 (four) times daily.    [provider]  chlorpheniramine (CHLOR-TRIMETON) 4 MG tablet Take 4 mg by mouth as needed for allergies.    [provider]  famotidine (PEPCID) 20 MG tablet Take 1 tablet (20 mg total) by mouth 2 (two) times daily. 12/23/17   Leland Her, DO  fexofenadine (ALLEGRA) 180 MG tablet Take 180 mg by mouth daily.    [provider]  FOLIC ACID PO Take 1 tablet by mouth daily.    [provider]    GLYCERIN-HYPROMELLOSE-PEG 400 OP Apply 1 drop to eye as needed (Dry eyes).    [provider]  hydrochlorothiazide (HYDRODIURIL) 25 MG tablet Take 1 tablet (25 mg total) by mouth daily. 09/30/17   Leland Her, DO  levothyroxine (SYNTHROID, LEVOTHROID) 100 MCG tablet TAKE 1 TABLET BY MOUTH DAILY BEFORE BREAKFAST 01/07/18   Leland Her, DO  lisinopril (PRINIVIL,ZESTRIL) 20 MG tablet Take 1 tablet (20 mg total) by mouth daily. 01/07/18   Leland Her, DO  Multiple Vitamins-Minerals (OCUVITE PRESERVISION PO) Take 1 tablet by mouth daily.    [provider]  Red Yeast Rice 600 MG CAPS Take 1 capsule (600 mg total) by mouth daily. 03/14/16   Leland Her, DO  vitamin C (ASCORBIC ACID) 250 MG tablet Take 250 mg by mouth daily.    [provider]  Vitamin E 100 units TABS Take 1 tablet by mouth daily.    [provider]  Zinc 50 MG TABS Take 1 tablet by mouth daily.    [provider]    Family History Family History  Problem Relation Age of Onset  . Other Mother        killed in gas explosion  . Asthma Mother   . Heart murmur Father   . Arthritis Father   . Hyperlipidemia Father   . Arthritis Brother   . Sinusitis Brother   . Heart disease Neg Hx     Social History Social History   Tobacco Use  . Smoking status: Never Smoker  . Smokeless tobacco: Never Used  Substance Use Topics  . Alcohol use: No  . Drug use: No     Allergies   Statins; Atorvastatin; Erythromycin; Furosemide; Hydrocodone-acetaminophen; Meloxicam; Metoprolol; Montelukast; Omeprazole; and Potassium chloride   Review of Systems Review of Systems  Constitutional: Negative for chills and fever.  HENT: Negative for facial swelling and sore throat.   Respiratory: Negative for shortness of breath.   Cardiovascular: Negative for chest pain.  Gastrointestinal: Positive for nausea and vomiting. Negative for abdominal pain.  Genitourinary: Negative for dysuria.   Musculoskeletal: Negative for back pain.  Skin: Negative for rash and wound.  Neurological: Positive for dizziness, light-headedness and headaches.  Psychiatric/Behavioral: The patient is not nervous/anxious.      Physical Exam Updated Vital Signs BP (!) 160/75   Pulse (!) 104   Temp 97.9 F (36.6 C) (Oral)   Resp 20   LMP  (LMP Unknown)   SpO2 95%   Physical Exam Vitals signs and nursing note reviewed.  Constitutional:      General: She is not in acute distress.    Appearance: She is well-developed. She is not diaphoretic.  HENT:     Head: Normocephalic and atraumatic.     Right Ear: Tympanic  membrane normal.     Left Ear: Tympanic membrane normal.     Mouth/Throat:     Pharynx: No oropharyngeal exudate.  Eyes:     General: No scleral icterus.       Right eye: No discharge.        Left eye: No discharge.     Conjunctiva/sclera: Conjunctivae normal.     Pupils: Pupils are equal, round, and reactive to light.  Neck:     Musculoskeletal: Normal range of motion and neck supple.     Thyroid: No thyromegaly.  Cardiovascular:     Rate and Rhythm: Normal rate and regular rhythm.     Heart sounds: Normal heart sounds. No murmur. No friction rub. No gallop.   Pulmonary:     Effort: Pulmonary effort is normal. No respiratory distress.     Breath sounds: Normal breath sounds. No stridor. No wheezing or rales.  Abdominal:     General: Bowel sounds are normal. There is no distension.     Palpations: Abdomen is soft.     Tenderness: There is no abdominal tenderness. There is no guarding or rebound.  Lymphadenopathy:     Cervical: No cervical adenopathy.  Skin:    General: Skin is warm and dry.     Coloration: Skin is not pale.     Findings: No rash.  Neurological:     Mental Status: She is alert.     Coordination: Coordination normal.     Comments: CN 3-12 intact; normal sensation throughout; 5/5 strength in all 4 extremities; equal bilateral grip strength      ED  Treatments / Results  Labs (all labs ordered are listed, but only abnormal results are displayed) Labs Reviewed  COMPREHENSIVE METABOLIC PANEL - Abnormal; Notable for the following components:      Result Value   Glucose, Bld 114 (*)    BUN 27 (*)    Creatinine, Ser 1.03 (*)    All other components within normal limits  URINALYSIS, ROUTINE W REFLEX MICROSCOPIC - Abnormal; Notable for the following components:   APPearance HAZY (*)    Leukocytes, UA TRACE (*)    Bacteria, UA RARE (*)    All other components within normal limits  I-STAT CHEM 8, ED - Abnormal; Notable for the following components:   BUN 28 (*)    Glucose, Bld 106 (*)    All other components within normal limits  PROTIME-INR  APTT  CBC  DIFFERENTIAL  RAPID URINE DRUG SCREEN, HOSP PERFORMED  I-STAT TROPONIN, ED  I-STAT BETA HCG BLOOD, ED ([facility], [facility], [facility] ONLY)  I-STAT TROPONIN, ED    EKG None  Radiology No results found.  Procedures Procedures (including critical care time)  Medications Ordered in ED Medications  LORazepam (ATIVAN) injection 0.5 mg (has no administration in time range)  sodium chloride 0.9 % bolus 1,000 mL (1,000 mLs Intravenous New Bag/Given 02/27/18 0505)  metoCLOPramide (REGLAN) injection 10 mg (10 mg Intravenous Given 02/27/18 0505)     Initial Impression / Assessment and Plan / ED Course  I have reviewed the triage vital signs and the nursing notes.  Pertinent labs & imaging results that were available during my care of the patient were reviewed by me and considered in my medical decision making (see chart for details).  Clinical Course as of Feb 27 705  Fri Feb 27, 2018  0608 I left a message for the patient's father, Ashley Torres, at patient's request, to make him aware what was  going on with the patient and that she was going to have an MRI. Phone # [phone]   [AL]    Clinical Course User Index [AL] Emi HolesLaw, Ion Gonnella M, PA-C    Patient presenting with dizziness unresolved  by fluids, Reglan.  Normal neuro exam without focal deficits considering persistence and no significant change with sitting still, will rule out posterior circulation stroke with MRI.  Labs are unremarkable.  At shift change, patient care transferred to Fayrene HelperBowie Tran, PA-C for follow-up of MRI.  If negative, discharge home for PCP follow-up.  Patient has chronic sinusitis.  Could be related to that versus viral.  Patient also evaluated by my attending, Dr. Blinda LeatherwoodPollina, who guided the patient's management and agrees with plan.  Final Clinical Impressions(s) / ED Diagnoses   Final diagnoses:  Dizziness    ED Discharge Orders    None       Emi HolesLaw, Yaretsi Humphres M, PA-C 02/27/18 [id number]    Gilda CreasePollina, Christopher J, MD 02/27/18 [phone]

## 2018-03-20 ENCOUNTER — Other Ambulatory Visit: Payer: Self-pay

## 2018-03-20 ENCOUNTER — Ambulatory Visit (INDEPENDENT_AMBULATORY_CARE_PROVIDER_SITE_OTHER): Payer: Medicare Other | Admitting: Family Medicine

## 2018-03-20 VITALS — BP 110/60 | Temp 98.1°F | Wt 175.0 lb

## 2018-03-20 DIAGNOSIS — J320 Chronic maxillary sinusitis: Secondary | ICD-10-CM | POA: Diagnosis not present

## 2018-03-20 MED ORDER — AMOXICILLIN-POT CLAVULANATE 875-125 MG PO TABS: 1.0000 | ORAL_TABLET | Freq: Two times a day (BID) | ORAL | 0 refills | Status: DC

## 2018-03-20 NOTE — Progress Notes (Signed)
   Subjective:    Patient ID: Ashley Torres, female    DOB: 09-Aug-1955, 63 y.o.   MRN: [id number]   CC: Sinus infection  HPI: Patient is a 63 yo female who presents today complaining of sinus pain, rhinorrhea. Patient reports sinus pain has been going on and off since November but has worsened in the past three days with green mucus. Patient also endorses sore throat and headaches. She denies any fever, nausea, vomiting. Patient denies any sick contact. Patient has had similar symptoms in the past. She denies any shortness of breath.  Smoking status reviewed   ROS: all other systems were reviewed and are negative other than in the HPI   Past Medical History:  Diagnosis Date  . Allergic rhinitis   . Aphakia of left eye   . Chronic back pain 2015  . Chronic sinusitis   . Deafness congenital   . Drusen of right optic disc   . Environmental and seasonal allergies   . Gastric stress ulcer   . GERD (gastroesophageal reflux disease) 1990  . Hyperlipidemia 1997  . Hypertension 1997  . Hypothyroidism   . Nuclear sclerosis of right eye   . Obesity   . Rubella syndrome, congenital   . S/P AVR (aortic valve replacement)   . Scoliosis   . Uterine prolapse   . Vision impairment   . Vision loss, left eye     Past Surgical History:  Procedure Laterality Date  . AORTIC VALVE REPLACEMENT  2017  . BREAST EXCISIONAL BIOPSY Left   . CARDIAC CATHETERIZATION    . COARCTATION OF AORTA REPAIR     repaired at age 28  . THYROID SURGERY  1997    Past medical history, surgical, family, and social history reviewed and updated in the EMR as appropriate.  Objective:  BP 110/60   Temp 98.1 F (36.7 C) (Oral)   Wt 175 lb (79.4 kg)   LMP  (LMP Unknown)   BMI 37.87 kg/m   Vitals and nursing note reviewed  General: NAD, pleasant, able to participate in exam HEENT: Maxillary and ethmoid sinus tenderness on palpation, oropharynx clear no exudates or erythema. Uvula midline Cardiac: RRR, normal  heart sounds, no murmurs. 2+ radial and PT pulses bilaterally Respiratory: CTAB, normal effort, No wheezes, rales or rhonchi Abdomen: soft, nontender, nondistended, no hepatic or splenomegaly, +BS Extremities: no edema or cyanosis. WWP. Skin: warm and dry, no rashes noted Neuro: alert and oriented x4, no focal deficits Psych: Normal affect and mood   Assessment & Plan:   Chronic maxillary sinusitis Patient presents with rhinorrhea and congestion for the past few days as well as sore throat. No fever, SOB or other symptoms more concerning for systemic infection such as pneumonia. Exam remarkable for maxillary sinus tenderness. Symptoms on presentation and chronicity are most consistent with viral URI however given history of sinus infection and tenderness to palpation with thick mucus will treat with antibiotic. --Augmentin 10 days course. --Follow up on prn basis   Lovena Neighbours, MD [facility] PGY-3

## 2018-03-20 NOTE — Assessment & Plan Note (Addendum)
Patient presents with rhinorrhea and congestion for the past few days as well as sore throat. No fever, SOB or other symptoms more concerning for systemic infection such as pneumonia. Exam remarkable for maxillary sinus tenderness. Symptoms on presentation and chronicity are most consistent with viral URI however given history of sinus infection and tenderness to palpation with thick mucus will treat with antibiotic. --Augmentin 10 days course. --Follow up on prn basis

## 2018-03-20 NOTE — Patient Instructions (Signed)
Sinusitis, Adult  Sinusitis is inflammation of your sinuses. Sinuses are hollow spaces in the bones around your face. Your sinuses are located:   Around your eyes.   In the middle of your forehead.   Behind your nose.   In your cheekbones.  Mucus normally drains out of your sinuses. When your nasal tissues become inflamed or swollen, mucus can become trapped or blocked. This allows bacteria, viruses, and fungi to grow, which leads to infection. Most infections of the sinuses are caused by a virus.  Sinusitis can develop quickly. It can last for up to 4 weeks (acute) or for more than 12 weeks (chronic). Sinusitis often develops after a cold.  What are the causes?  This condition is caused by anything that creates swelling in the sinuses or stops mucus from draining. This includes:   Allergies.   Asthma.   Infection from bacteria or viruses.   Deformities or blockages in your nose or sinuses.   Abnormal growths in the nose (nasal polyps).   Pollutants, such as chemicals or irritants in the air.   Infection from fungi (rare).  What increases the risk?  You are more likely to develop this condition if you:   Have a weak body defense system (immune system).   Do a lot of swimming or diving.   Overuse nasal sprays.   Smoke.  What are the signs or symptoms?  The main symptoms of this condition are pain and a feeling of pressure around the affected sinuses. Other symptoms include:   Stuffy nose or congestion.   Thick drainage from your nose.   Swelling and warmth over the affected sinuses.   Headache.   Upper toothache.   A cough that may get worse at night.   Extra mucus that collects in the throat or the back of the nose (postnasal drip).   Decreased sense of smell and taste.   Fatigue.   A fever.   Sore throat.   Bad breath.  How is this diagnosed?  This condition is diagnosed based on:   Your symptoms.   Your medical history.   A physical exam.   Tests to find out if your condition is  acute or chronic. This may include:  ? Checking your nose for nasal polyps.  ? Viewing your sinuses using a device that has a light (endoscope).  ? Testing for allergies or bacteria.  ? Imaging tests, such as an MRI or CT scan.  In rare cases, a bone biopsy may be done to rule out more serious types of fungal sinus disease.  How is this treated?  Treatment for sinusitis depends on the cause and whether your condition is chronic or acute.   If caused by a virus, your symptoms should go away on their own within 10 days. You may be given medicines to relieve symptoms. They include:  ? Medicines that shrink swollen nasal passages (topical intranasal decongestants).  ? Medicines that treat allergies (antihistamines).  ? A spray that eases inflammation of the nostrils (topical intranasal corticosteroids).  ? Rinses that help get rid of thick mucus in your nose (nasal saline washes).   If caused by bacteria, your health care provider may recommend waiting to see if your symptoms improve. Most bacterial infections will get better without antibiotic medicine. You may be given antibiotics if you have:  ? A severe infection.  ? A weak immune system.   If caused by narrow nasal passages or nasal polyps, you may need   to have surgery.  Follow these instructions at home:  Medicines   Take, use, or apply over-the-counter and prescription medicines only as told by your health care provider. These may include nasal sprays.   If you were prescribed an antibiotic medicine, take it as told by your health care provider. Do not stop taking the antibiotic even if you start to feel better.  Hydrate and humidify     Drink enough fluid to keep your urine pale yellow. Staying hydrated will help to thin your mucus.   Use a cool mist humidifier to keep the humidity level in your home above 50%.   Inhale steam for 10-15 minutes, 3-4 times a day, or as told by your health care provider. You can do this in the bathroom while a hot shower is  running.   Limit your exposure to cool or dry air.  Rest   Rest as much as possible.   Sleep with your head raised (elevated).   Make sure you get enough sleep each night.  General instructions     Apply a warm, moist washcloth to your face 3-4 times a day or as told by your health care provider. This will help with discomfort.   Wash your hands often with soap and water to reduce your exposure to germs. If soap and water are not available, use hand sanitizer.   Do not smoke. Avoid being around people who are smoking (secondhand smoke).   Keep all follow-up visits as told by your health care provider. This is important.  Contact a health care provider if:   You have a fever.   Your symptoms get worse.   Your symptoms do not improve within 10 days.  Get help right away if:   You have a severe headache.   You have persistent vomiting.   You have severe pain or swelling around your face or eyes.   You have vision problems.   You develop confusion.   Your neck is stiff.   You have trouble breathing.  Summary   Sinusitis is soreness and inflammation of your sinuses. Sinuses are hollow spaces in the bones around your face.   This condition is caused by nasal tissues that become inflamed or swollen. The swelling traps or blocks the flow of mucus. This allows bacteria, viruses, and fungi to grow, which leads to infection.   If you were prescribed an antibiotic medicine, take it as told by your health care provider. Do not stop taking the antibiotic even if you start to feel better.   Keep all follow-up visits as told by your health care provider. This is important.  This information is not intended to replace advice given to you by your health care provider. Make sure you discuss any questions you have with your health care provider.  Document Released: 03/04/2005 Document Revised: 08/04/2017 Document Reviewed: 08/04/2017  Elsevier Interactive Patient Education  2019 [employer]

## 2018-04-03 ENCOUNTER — Ambulatory Visit
Admission: RE | Admit: 2018-04-03 | Discharge: 2018-04-03 | Disposition: A | Discharge status: Home / Self Care | Payer: Medicare Other | Source: Ambulatory Visit | Attending: Internal Medicine | Admitting: Internal Medicine

## 2018-04-03 DIAGNOSIS — Z1231 Encounter for screening mammogram for malignant neoplasm of breast: Secondary | ICD-10-CM

## 2018-04-03 IMAGING — MG DIGITAL SCREENING BILATERAL MAMMOGRAM WITH TOMO AND CAD
8 series · 9 of 24 positions shown · non-contrast
Comparison: Previous exam(s).

CLINICAL DATA: Screening.

EXAM:
DIGITAL SCREENING BILATERAL MAMMOGRAM WITH TOMO AND CAD

[L CC synth-2D]
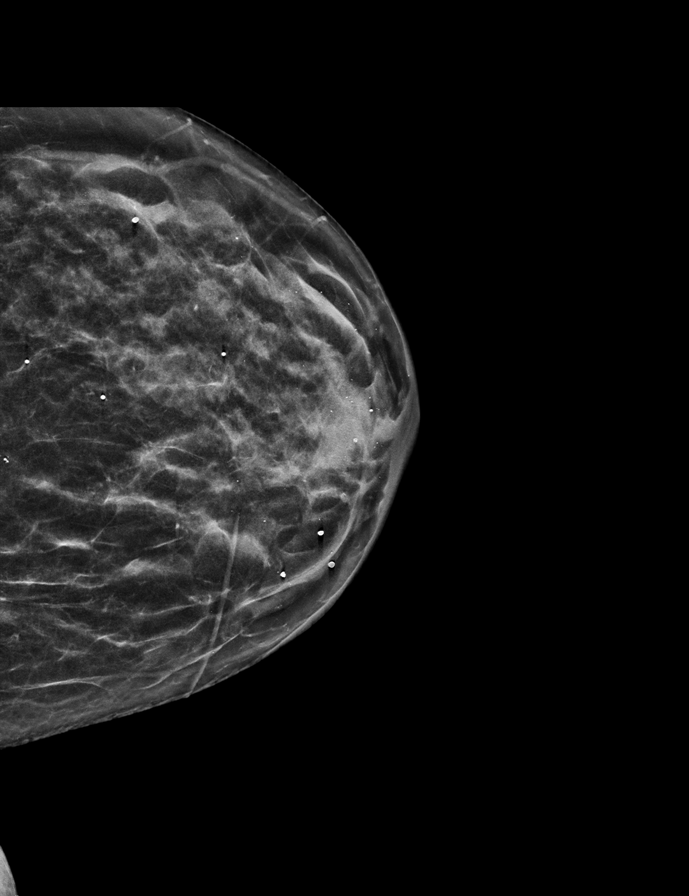

[R MLO synth-2D]
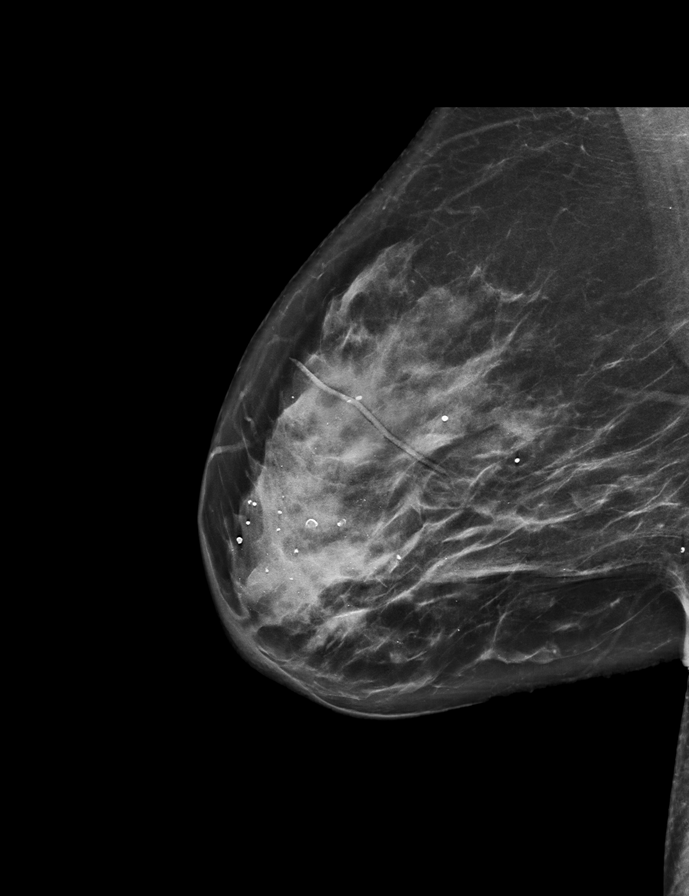

[L MLO synth-2D]
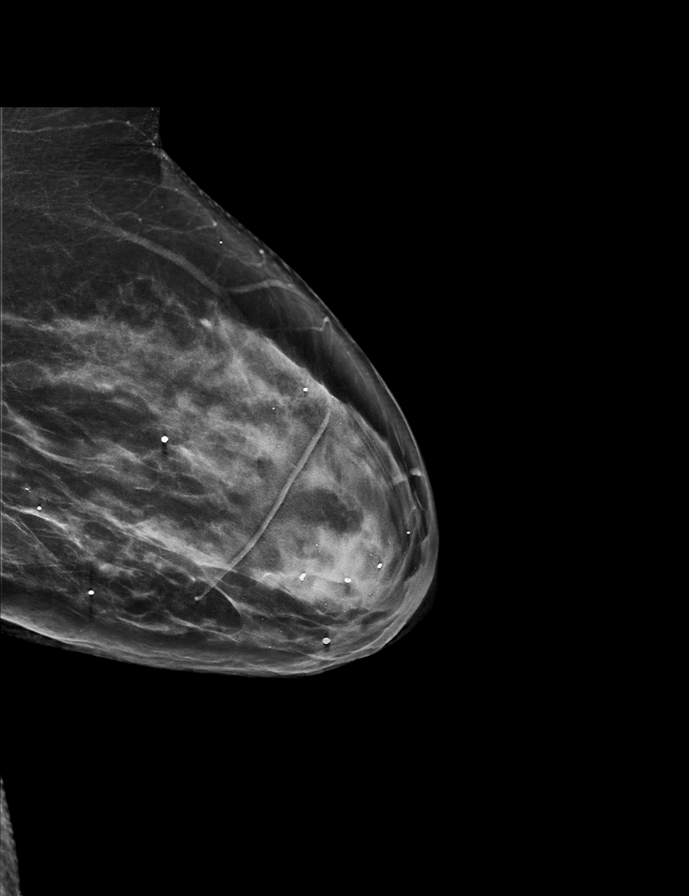

[R CC synth-2D]
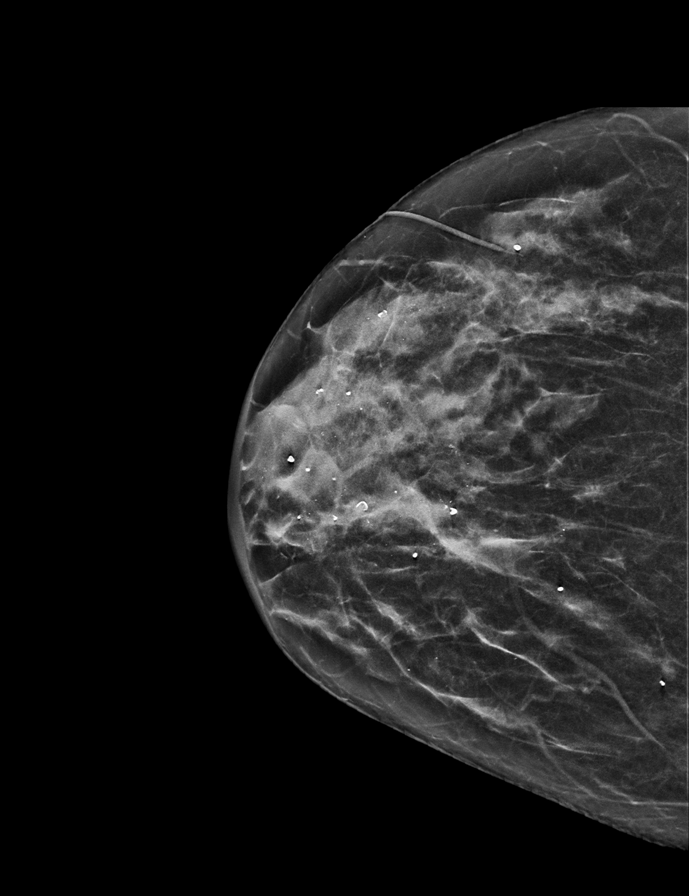

[R CC tomo · 2 of 53 frames shown]
[frame 18/53]
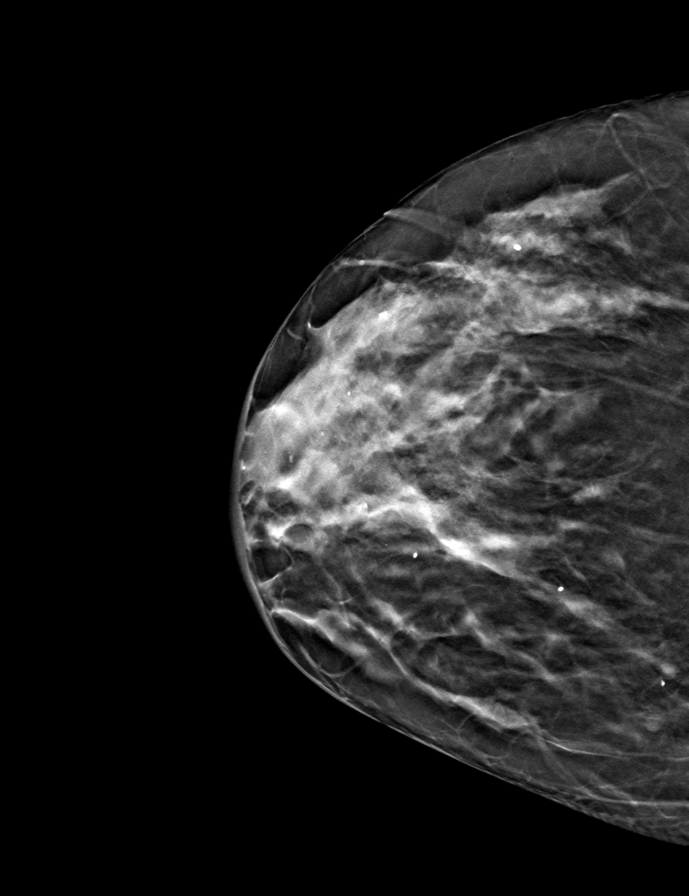
[frame 27/53]
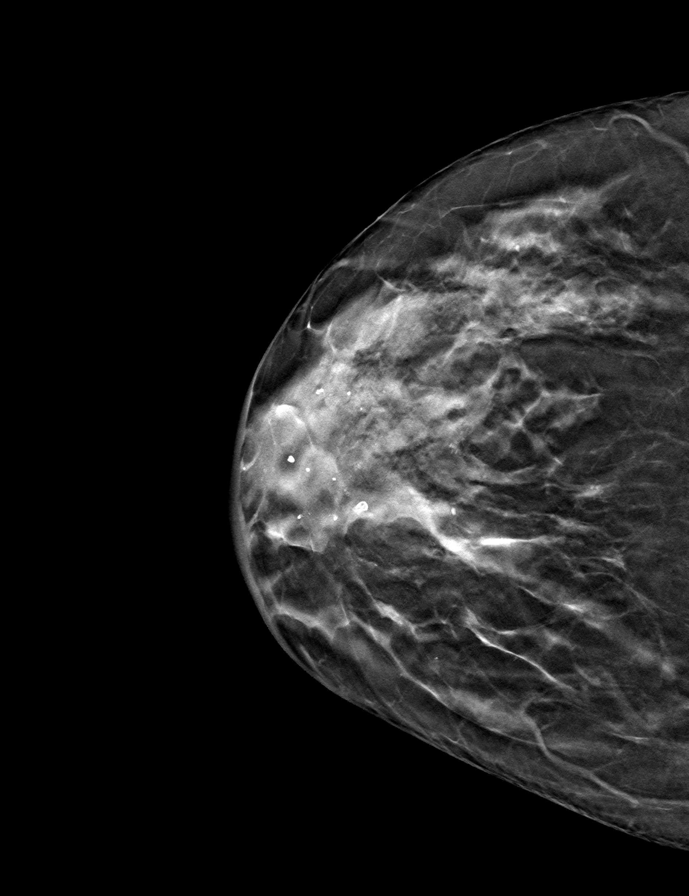

[L MLO tomo · tomo slice 38/75.0]
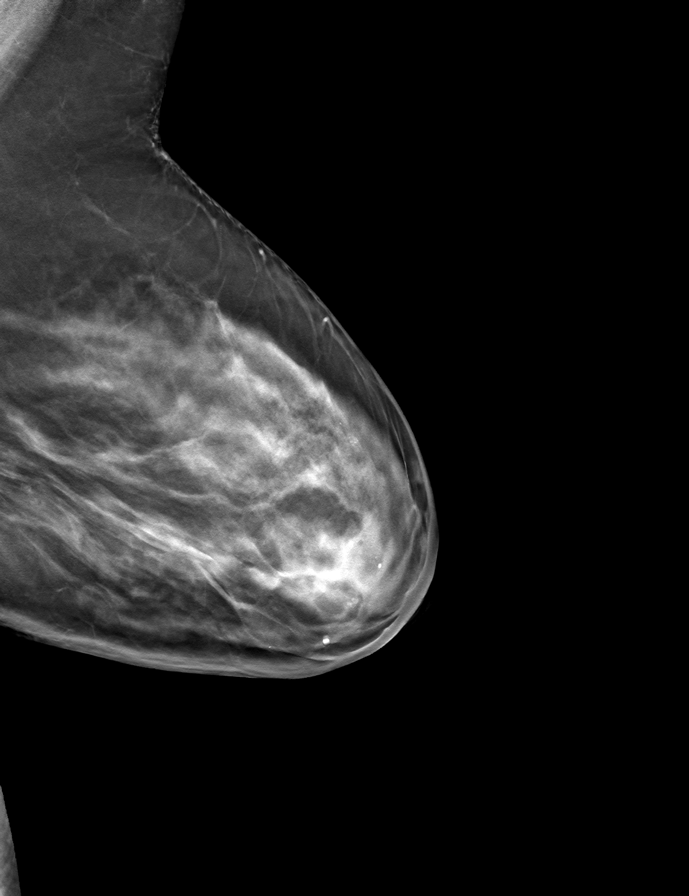

[R MLO tomo · tomo slice 37/72.0]
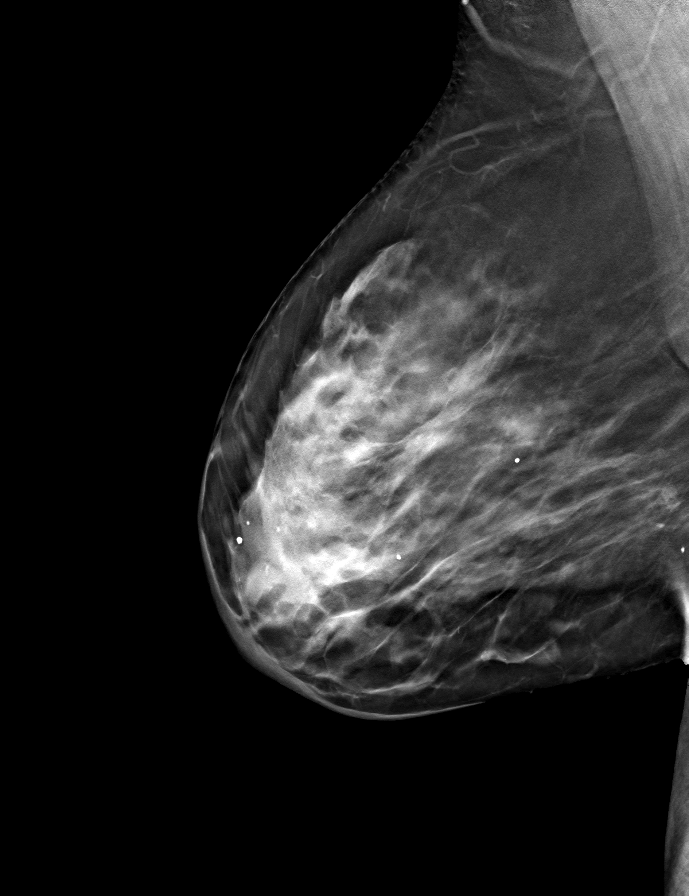

[L CC tomo · tomo slice 26/51.0]
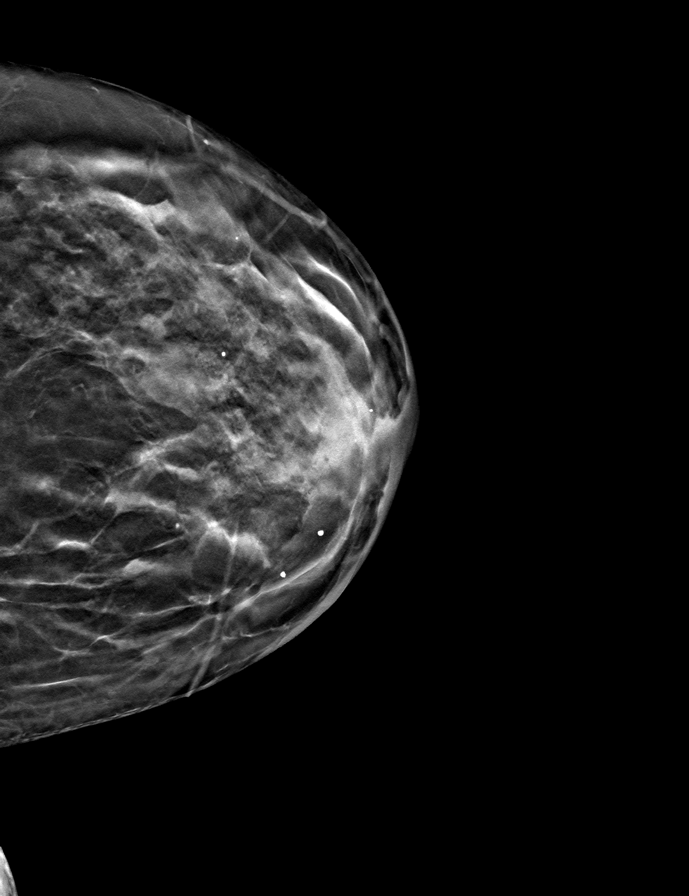

[9 of 24 positions shown; findings below may reference images not displayed]

ACR Breast Density Category c: The breast tissue is heterogeneously
dense, which may obscure small masses.
FINDINGS: There are no findings suspicious for malignancy. Images were
processed with CAD.
IMPRESSION: No mammographic evidence of malignancy. A result letter of this
screening mammogram will be mailed directly to the patient.

RECOMMENDATION:
Screening mammogram in one year. (Code:[5V])

BI-RADS CATEGORY  1: Negative.

## 2018-04-06 ENCOUNTER — Encounter: Payer: Self-pay | Admitting: Physician Assistant

## 2018-04-06 NOTE — Progress Notes (Signed)
Cardiology Office Note    Date:  04/07/2018  ID:  Ashley Lemmingsawn Hurtado, DOB 04/20/1955, MRN [id number] PCP:  Leland HerYoo, Elsia J, DO  Cardiologist:  Tobias AlexanderKatarina Nelson, MD   Chief Complaint: 6 month f/u AVR, coarctation repair  History of Present Illness:  Ashley Torres is a 63 y.o. female with history of bicuspid aortic valve status post bioprosthetic aortic valve replacement in 03/2015, coarctation of the aorta status post repair as a child, hypertension, hyperlipidemia, hypothyroidism, chronic LE edema, chronic sinusitis, GERD, and congenital deafness, who presents for 6 month follow-up of her AVR and coarctation of the aorta.   Per Dr. [name]'s notes, she had coarctation repair around 63 years of age. In 2016 she was found to have severe aortic stenosis. [facility] showed LMCA normal, LAD normal, LCx normal, RCA normal. She subsequently underwent aortic valve replacement with annulus enlargement (19 mm Magna model 300 Carpentier-Edwards bovine pericardial heart valve) [facility]. Last echo 08/2017 showed moderate focal BSH of the septum, EF 60-65%, grade 1 DD, high VFP, /p AVR with mildly elevated mean gradient of 21 mmHg; no AI; trace TR with mild pulmonary hypertension; elevated velocity in descending aorta (2.7 m/s); suggest CTA or MRA to further assess coarctation. CT scan 09/2017 showed "There is some expected narrowing of the distal arch and proximal descending thoracic aorta at the level of prior coarctation repair. 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 This can be followed in the future by CTA or MRA. Elevated aortic velocity may be secondary to the underlying smoothly tapered narrowing in combination with significant tortuosity present. The lack of significant collateral arteries may argue against a hemodynamically significant  recurrent stenosis at the level of prior repair." Dr. Delton SeeNelson felt there was some expected narrowing but not significant and recommended f/u CT 09/2018. She was seen in the ED 02/2018 with acute on chronic dizziness with unrevealing workup. She declined MRI at that time. Last labs 02/2018 K 4.0, Cr 1.0, normal CBC, UDS neg, normal LFTs, 09/2017 TSH wnl, LDL 140 (PCP).  She returns for follow-up with an interpreter. She states she's doing just fine. She took 1 dose of meclizine after her dizziness in 02/2018 and it has not recurred. She denies any CP, palpitations, pre-syncope or syncope. No orthopnea. She has chronic unchanged edema. She was previously offered furosemide but opted against this. She does not specifically indicate this is a concern today. She gets some SOB when her allergies flare up but not recently. [place]   Past Medical History:  Diagnosis Date  . Allergic rhinitis   . Aortic stenosis   . Aphakia of left eye   . Bicuspid aortic valve   . Chronic back pain 2015  . Chronic sinusitis   . Deafness congenital   . Drusen of right optic disc   . Environmental and seasonal allergies   . Gastric stress ulcer   . GERD (gastroesophageal reflux disease) 1990  . H/O aortic coarctation repair   . Hyperlipidemia 1997  . Hypertension 1997  . Hypothyroidism   . Nuclear sclerosis of right eye   . Obesity   . Rubella syndrome, congenital   . S/P AVR (aortic valve replacement)   . Scoliosis   . Uterine prolapse   . Vision impairment   . Vision loss, left eye  Past Surgical History:  Procedure Laterality Date  . AORTIC VALVE REPLACEMENT  2017  . BREAST EXCISIONAL BIOPSY Left   . CARDIAC CATHETERIZATION    . COARCTATION OF AORTA REPAIR     repaired at age 89  . THYROID SURGERY  1997    Current Medications: Current Meds  Medication Sig  . acetaminophen (TYLENOL) 500 MG tablet Take 1,000 mg by mouth every 6 (six) hours as needed for moderate pain.  .  Albuterol Sulfate (PROAIR RESPICLICK) 108 (90 Base) MCG/ACT AEPB Inhale 1 puff into the lungs every 4 (four) hours as needed.  Marland Kitchen. aspirin EC 81 MG tablet Take 81 mg by mouth daily.  . budesonide (RHINOCORT AQUA) 32 MCG/ACT nasal spray Place 1 spray into both nostrils daily as needed.  . Calcium Carb-Cholecalciferol [phone] MG-UNIT CAPS Take 2 tablets by mouth daily.  . calcium carbonate (TUMS - DOSED IN MG ELEMENTAL CALCIUM) 500 MG chewable tablet Chew 1 tablet by mouth 4 (four) times daily.  . chlorpheniramine (CHLOR-TRIMETON) 4 MG tablet Take 4 mg by mouth as needed for allergies.  . famotidine (PEPCID) 20 MG tablet Take 1 tablet (20 mg total) by mouth 2 (two) times daily.  . fexofenadine (ALLEGRA) 180 MG tablet Take 180 mg by mouth daily.  Marland Kitchen. FOLIC ACID PO Take 1 tablet by mouth daily.  Marland Kitchen. GLYCERIN-HYPROMELLOSE-PEG 400 OP Apply 1 drop to eye as needed (Dry eyes).  . hydrochlorothiazide (HYDRODIURIL) 25 MG tablet Take 1 tablet (25 mg total) by mouth daily.  Marland Kitchen. levothyroxine (SYNTHROID, LEVOTHROID) 100 MCG tablet TAKE 1 TABLET BY MOUTH DAILY BEFORE BREAKFAST  . lisinopril (PRINIVIL,ZESTRIL) 20 MG tablet Take 1 tablet (20 mg total) by mouth daily.  . meclizine (ANTIVERT) 25 MG tablet Take 1 tablet (25 mg total) by mouth 3 (three) times daily as needed for dizziness.  . Multiple Vitamins-Minerals (OCUVITE PRESERVISION PO) Take 1 tablet by mouth daily.  . Red Yeast Rice 600 MG CAPS Take 1 capsule (600 mg total) by mouth daily.  . vitamin C (ASCORBIC ACID) 250 MG tablet Take 250 mg by mouth daily.  . Vitamin E 100 units TABS Take 1 tablet by mouth daily.  . Zinc 50 MG TABS Take 1 tablet by mouth daily.     Allergies:   Statins; Atorvastatin; Chlor-trimeton  [chlorpheniramine maleate]; Erythromycin; Furosemide; Hydrocodone-acetaminophen; Meloxicam; Metoprolol; Montelukast; Omeprazole; and Potassium chloride   Social History   Socioeconomic History  . Marital status: Married    Spouse name: Not on  file  . Number of children: Not on file  . Years of education: some college  . Highest education level: Not on file  Occupational History    Comment: unemployed  Social Needs  . Financial resource strain: Not on file  . Food insecurity:    Worry: Not on file    Inability: Not on file  . Transportation needs:    Medical: Not on file    Non-medical: Not on file  Tobacco Use  . Smoking status: Never Smoker  . Smokeless tobacco: Never Used  Substance and Sexual Activity  . Alcohol use: No  . Drug use: No  . Sexual activity: Never  Lifestyle  . Physical activity:    Days per week: Not on file    Minutes per session: Not on file  . Stress: Not on file  Relationships  . Social connections:    Talks on phone: Not on file    Gets together: Not on file    Attends religious  service: Not on file    Active member of club or organization: Not on file    Attends meetings of clubs or organizations: Not on file    Relationship status: Not on file  Other Topics Concern  . Not on file  Social History Narrative   Lives alone     Family History:  The patient's family history includes Arthritis in her brother and father; Asthma in her mother; Breast cancer in her paternal aunt; Heart murmur in her father; Hyperlipidemia in her father; Other in her mother; Sinusitis in her brother. There is no history of Heart disease.  ROS:   Please see the history of present illness. All other systems are reviewed and otherwise negative.    PHYSICAL EXAM:   VS:  BP 120/70   Pulse 89   Ht 4\' 9"  (1.448 m)   Wt 175 lb 14.4 oz (79.8 kg)   LMP  (LMP Unknown)   SpO2 97%   BMI 38.06 kg/m   BMI: Body mass index is 38.06 kg/m. GEN: Well nourished, well developed WF in no acute distress HEENT: normocephalic, atraumatic Neck: no JVD, carotid bruits, or masses Cardiac: RRR; 2/6 SEM at RUSB, no rubs or gallops, 1+ BLE edema which tapers at sockline Respiratory:  clear to auscultation bilaterally, normal  work of breathing GI: soft, nontender, nondistended, + BS MS: no deformity or atrophy Skin: warm and dry, no rash Neuro:  Alert and Oriented x 3, Strength and sensation are intact, follows commands Psych: euthymic mood, full affect  Wt Readings from Last 3 Encounters:  04/07/18 175 lb 14.4 oz (79.8 kg)  03/20/18 175 lb (79.4 kg)  01/07/18 177 lb (80.3 kg)      Studies/Labs Reviewed:   EKG:  EKG was ordered today and personally reviewed by me and demonstrates NSR possible LAE, LVH with QRS widening, No significant change.  Recent Labs: 09/30/2017: TSH 2.180 02/27/2018: ALT 29; BUN 28; Creatinine, Ser 1.00; Hemoglobin 13.3; Platelets 214; Potassium 4.0; Sodium 142   Lipid Panel    Component Value Date/Time   CHOL 234 (H) 09/30/2017 1441   TRIG 202 (H) 09/30/2017 1441   HDL 54 09/30/2017 1441   CHOLHDL 4.3 09/30/2017 1441   LDLCALC 140 (H) 09/30/2017 1441   LDLDIRECT 135 (H) 12/13/2016 1440    Additional studies/ records that were reviewed today include: Summarized above.  ASSESSMENT & PLAN:   1. AS s/p AVR 2017 - echo in 08/2017 was unchanged from 2017. Continue to monitor clinically. Will have her f/u in 6 months to decide timing of repeat echocardiogram. SBE ppx reviewed. She states she has a prescription on file already for amoxicillin 500mg  to take 4 tabs 1 hour prior to dental work. Continue ASA. 2. Coarctation repair - see above. Dr. Delton See recommended repeat CT 09/2018 which the patient indicates is already scheduled. She has chronic LEE which is unchanged from prior reports. She does not see this as a specific concern today and continues on HCTZ. 3. Essential HTN - well controlled. 4. Dizziness - this has resolved. She denied any brain imaging. She only required 1 dose of meclizine and it has not recurred. She wonders if it was stirred up by allergies. If this recurs may need to consider updated cardiac testing. There were no specific features to suggest cardiac etiology.  Her labs suggested pre-renal state and I have advised she f/u with PCP to trend further. 5. Hyperlipidemia - managed by PCP.   Disposition: F/u with Dr. Delton See  in 6 months.  Medication Adjustments/Labs and Tests Ordered: Current medicines are reviewed at length with the patient today.  Concerns regarding medicines are outlined above. Medication changes, Labs and Tests ordered today are summarized above and listed in the Patient Instructions accessible in Encounters.   Signed, Laurann Montana, PA-C  04/07/2018 2:56 PM    [facility] [address] [place], [place], [place]  [address] Phone: [phone]; Fax: [phone]

## 2018-04-07 ENCOUNTER — Ambulatory Visit (INDEPENDENT_AMBULATORY_CARE_PROVIDER_SITE_OTHER): Payer: Medicare Other | Admitting: Physician Assistant

## 2018-04-07 ENCOUNTER — Telehealth: Payer: Self-pay | Admitting: Physician Assistant

## 2018-04-07 ENCOUNTER — Encounter: Payer: Self-pay | Admitting: Physician Assistant

## 2018-04-07 VITALS — BP 120/70 | HR 89 | Ht <= 58 in | Wt 175.9 lb

## 2018-04-07 DIAGNOSIS — R42 Dizziness and giddiness: Secondary | ICD-10-CM | POA: Diagnosis not present

## 2018-04-07 DIAGNOSIS — Z952 Presence of prosthetic heart valve: Secondary | ICD-10-CM

## 2018-04-07 DIAGNOSIS — Z8774 Personal history of (corrected) congenital malformations of heart and circulatory system: Secondary | ICD-10-CM | POA: Diagnosis not present

## 2018-04-07 DIAGNOSIS — E785 Hyperlipidemia, unspecified: Secondary | ICD-10-CM | POA: Diagnosis not present

## 2018-04-07 DIAGNOSIS — I10 Essential (primary) hypertension: Secondary | ICD-10-CM | POA: Diagnosis not present

## 2018-04-07 NOTE — Patient Instructions (Addendum)
Medication Instructions:  Your physician recommends that you continue on your current medications as directed. Please refer to the Current Medication list given to you today.  If you need a refill on your cardiac medications before your next appointment, please call your pharmacy.   Lab work: None ordered  If you have labs (blood work) drawn today and your tests are completely normal, you will receive your results only by: Marland Kitchen MyChart Message (if you have MyChart) OR . A paper copy in the mail If you have any lab test that is abnormal or we need to change your treatment, we will call you to review the results.  Testing/Procedures: None ordered  Follow-Up: At [facility], you and your health needs are our priority.  As part of our continuing mission to provide you with exceptional heart care, we have created designated Provider Care Teams.  These Care Teams include your primary Cardiologist (physician) and Advanced Practice Providers (APPs -  Physician Assistants and Nurse Practitioners) who all work together to provide you with the care you need, when you need it. You will need a follow up appointment in 6 months.  Please call our office 2 months in advance to schedule this appointment.  You may see Tobias Alexander, MD or one of the following Advanced Practice Providers on your designated Care Team:   [place], PA-C Ronie Spies, PA-C . Jacolyn Reedy, PA-C  Any Other Special Instructions Will Be Listed Below (If Applicable). Endocarditis Information  You may be at risk for developing endocarditis since you have  an artificial heart valve  or a repaired heart valve. Endocarditis is an infection of the lining of the heart or heart valves.   Certain surgical and dental procedures may put you at risk,  such as teeth cleaning or other dental procedures or any surgery involving the respiratory, urinary, gastrointestinal tract, gallbladder or prostate.   Notify your doctor or dentist  before having any invasive procedures. You will need to take antibiotics before certain procedures.   To prevent endocarditis, maintain good oral health. Seek prompt medical attention for any mouth/gum, skin or urinary tract infections.

## 2018-04-07 NOTE — Telephone Encounter (Addendum)
One thing I wanted to mention to patient but did not discuss at today's visit - her labs in the ER showed she was mildly dehydrated. I saw she saw an OP [place] practice 03/20/18 but this was for sinus infection and it does not look like labs were rechecked at that time. This was not for ER f/u. I would recommend she get a repeat BMET by PCP to trend if she hasn't already done so, since they have her on HCTZ. If she does not think she can coordinate with them we can order. Dayna Dunn PA-C

## 2018-04-08 ENCOUNTER — Telehealth: Payer: Self-pay | Admitting: Cardiovascular Disease

## 2018-04-08 DIAGNOSIS — I10 Essential (primary) hypertension: Secondary | ICD-10-CM

## 2018-04-08 DIAGNOSIS — Z952 Presence of prosthetic heart valve: Secondary | ICD-10-CM

## 2018-04-08 NOTE — Telephone Encounter (Signed)
Called pt re: message below from [facility], [place]. Left a message for pt to return my call.

## 2018-04-08 NOTE — Telephone Encounter (Signed)
Will route to Elliot CousinJennifer Witty, RMA who called the pt yesterday.

## 2018-04-09 NOTE — Telephone Encounter (Signed)
Received call through interpreter Patient asks why repeat lab work is needed and I reviewed Ashley Torres's message to her. Patient is scheduled for lab appointment on 1/29 at our office.

## 2018-04-09 NOTE — Addendum Note (Signed)
Addended by: Levi AlandSWINYER, Sani Madariaga M on: 04/09/2018 03:53 PM   Modules accepted: Orders

## 2018-04-09 NOTE — Telephone Encounter (Signed)
Spoke with pt thru [employer], [place], [place] [address]. Pt was advised of the recommendations of having her kidney function rechecked, since the e/d labs showed mild dehydration. Pt stated that "yall are driving me crazy, I'm tired of going to dr's and getting checked". Pt was made aware that we are just trying to make sure her Cr has normalized, and it was our recommendation, but it was her choice". Pt said ok, well, yea, a lot as been going on lately, just too much. I advised pt I understood, and if she decided to have them done to let us know so we can order them Pt states, I will see what my PCP says.

## 2018-04-09 NOTE — Telephone Encounter (Signed)
See documentation from 1/21 telephone note

## 2018-04-15 ENCOUNTER — Other Ambulatory Visit: Payer: Medicare Other | Admitting: *Deleted

## 2018-04-15 DIAGNOSIS — I10 Essential (primary) hypertension: Secondary | ICD-10-CM

## 2018-04-15 DIAGNOSIS — Z952 Presence of prosthetic heart valve: Secondary | ICD-10-CM

## 2018-04-16 LAB — BASIC METABOLIC PANEL
BUN/Creatinine Ratio: 42 — ABNORMAL HIGH (ref 12–28)
BUN: 27 mg/dL (ref 8–27)
CO2: 24 mmol/L (ref 20–29)
Calcium: 10.2 mg/dL (ref 8.7–10.3)
Chloride: 98 mmol/L (ref 96–106)
Creatinine, Ser: 0.64 mg/dL (ref 0.57–1.00)
GFR calc Af Amer: 112 mL/min/{1.73_m2} (ref >59)
GFR calc non Af Amer: 97 mL/min/{1.73_m2} (ref >59)
Glucose: 78 mg/dL (ref 65–99)
Potassium: 4.4 mmol/L (ref 3.5–5.2)
Sodium: 141 mmol/L (ref 134–144)

## 2018-04-27 ENCOUNTER — Encounter: Payer: Self-pay | Admitting: Neurology

## 2018-04-27 ENCOUNTER — Ambulatory Visit (INDEPENDENT_AMBULATORY_CARE_PROVIDER_SITE_OTHER): Payer: Medicare Other | Admitting: Neurology

## 2018-04-27 VITALS — BP 121/69 | HR 80 | Ht <= 58 in | Wt 175.0 lb

## 2018-04-27 DIAGNOSIS — H6121 Impacted cerumen, right ear: Secondary | ICD-10-CM | POA: Diagnosis not present

## 2018-04-27 DIAGNOSIS — H9201 Otalgia, right ear: Secondary | ICD-10-CM | POA: Diagnosis not present

## 2018-04-27 DIAGNOSIS — H8110 Benign paroxysmal vertigo, unspecified ear: Secondary | ICD-10-CM | POA: Diagnosis not present

## 2018-04-27 MED ORDER — MECLIZINE HCL 25 MG PO TABS: 25.0000 mg | ORAL_TABLET | Freq: Three times a day (TID) | ORAL | 3 refills | Status: DC | PRN

## 2018-04-27 NOTE — Patient Instructions (Addendum)
Dr. Haroldine Lawsrossley ENT to look in ears Physical Therapy to teach you exercises for Vertigo Meclizine as needed for vertigo Can consider MRI of the brain or CaT scan of the head if needed   Benign Positional Vertigo Vertigo is the feeling that you or your surroundings are moving when they are not. Benign positional vertigo is the most common form of vertigo. This is usually a harmless condition (benign). This condition is positional. This means that symptoms are triggered by certain movements and positions. This condition can be dangerous if it occurs while you are doing something that could cause harm to you or others. This includes activities such as driving or operating machinery. What are the causes? In many cases, the cause of this condition is not known. It may be caused by a disturbance in an area of the inner ear that helps your brain to sense movement and balance. This disturbance can be caused by:  Viral infection (labyrinthitis).  Head injury.  Repetitive motion, such as jumping, dancing, or running. What increases the risk? You are more likely to develop this condition if:  You are a woman.  You are 61 years of age or older. What are the signs or symptoms? Symptoms of this condition usually happen when you move your head or your eyes in different directions. Symptoms may start suddenly, and usually last for less than a minute. They include:  Loss of balance and falling.  Feeling like you are spinning or moving.  Feeling like your surroundings are spinning or moving.  Nausea and vomiting.  Blurred vision.  Dizziness.  Involuntary eye movement (nystagmus). Symptoms can be mild and cause only minor problems, or they can be severe and interfere with daily life. Episodes of benign positional vertigo may return (recur) over time. Symptoms may improve over time. How is this diagnosed? This condition may be diagnosed based on:  Your medical history.  Physical exam of the  head, neck, and ears.  Tests, such as: ? MRI. ? CT scan. ? Eye movement tests. Your health care provider may ask you to change positions quickly while he or she watches you for symptoms of benign positional vertigo, such as nystagmus. Eye movement may be tested with a variety of exams that are designed to evaluate or stimulate vertigo. ? An electroencephalogram (EEG). This records electrical activity in your brain. ? Hearing tests. You may be referred to a health care provider who specializes in ear, nose, and throat (ENT) problems (otolaryngologist) or a provider who specializes in disorders of the nervous system (neurologist). How is this treated?  This condition may be treated in a session in which your health care provider moves your head in specific positions to adjust your inner ear back to normal. Treatment for this condition may take several sessions. Surgery may be needed in severe cases, but this is rare. In some cases, benign positional vertigo may resolve on its own in 2-4 weeks. Follow these instructions at home: Safety  Move slowly. Avoid sudden body or head movements or certain positions, as told by your health care provider.  Avoid driving until your health care provider says it is safe for you to do so.  Avoid operating heavy machinery until your health care provider says it is safe for you to do so.  Avoid doing any tasks that would be dangerous to you or others if vertigo occurs.  If you have trouble walking or keeping your balance, try using a cane for stability. If you feel  dizzy or unstable, sit down right away.  Return to your normal activities as told by your health care provider. Ask your health care provider what activities are safe for you. General instructions  Take over-the-counter and prescription medicines only as told by your health care provider.  Drink enough fluid to keep your urine pale yellow.  Keep all follow-up visits as told by your health  care provider. This is important. Contact a health care provider if:  You have a fever.  Your condition gets worse or you develop new symptoms.  Your family or friends notice any behavioral changes.  You have nausea or vomiting that gets worse.  You have numbness or a "pins and needles" sensation. Get help right away if you:  Have difficulty speaking or moving.  Are always dizzy.  Faint.  Develop severe headaches.  Have weakness in your legs or arms.  Have changes in your hearing or vision.  Develop a stiff neck.  Develop sensitivity to light. Summary  Vertigo is the feeling that you or your surroundings are moving when they are not. Benign positional vertigo is the most common form of vertigo.  The cause of this condition is not known. It may be caused by a disturbance in an area of the inner ear that helps your brain to sense movement and balance.  Symptoms include loss of balance and falling, feeling that you or your surroundings are moving, nausea and vomiting, and blurred vision.  This condition can be diagnosed based on symptoms, physical exam, and other tests, such as MRI, CT scan, eye movement tests, and hearing tests.  Follow safety instructions as told by your health care provider. You will also be told when to contact your health care provider in case of problems. This information is not intended to replace advice given to you by your health care provider. Make sure you discuss any questions you have with your health care provider. Document Released: 12/10/2005 Document Revised: 08/13/2017 Document Reviewed: 08/13/2017 Elsevier Interactive Patient Education  2019 [employer]    Vertigo Vertigo is the feeling that you or your surroundings are moving when they are not. Vertigo can be dangerous if it occurs while you are doing something that could endanger you or others, such as driving. What are the causes? This condition is caused by a disturbance in the  signals that are sent by your body's sensory systems to your brain. Different causes of a disturbance can lead to vertigo, including:  Infections, especially in the inner ear.  A bad reaction to a drug, or misuse of alcohol and medicines.  Withdrawal from drugs or alcohol.  Quickly changing positions, as when lying down or rolling over in bed.  Migraine headaches.  Decreased blood flow to the brain.  Decreased blood pressure.  Increased pressure in the brain from a head or neck injury, stroke, infection, tumor, or bleeding.  Central nervous system disorders. What are the signs or symptoms? Symptoms of this condition usually occur when you move your head or your eyes in different directions. Symptoms may start suddenly, and they usually last for less than a minute. Symptoms may include:  Loss of balance and falling.  Feeling like you are spinning or moving.  Feeling like your surroundings are spinning or moving.  Nausea and vomiting.  Blurred vision or double vision.  Difficulty hearing.  Slurred speech.  Dizziness.  Involuntary eye movement (nystagmus). Symptoms can be mild and cause only slight annoyance, or they can be severe and  interfere with daily life. Episodes of vertigo may return (recur) over time, and they are often triggered by certain movements. Symptoms may improve over time. How is this diagnosed? This condition may be diagnosed based on medical history and the quality of your nystagmus. Your health care provider may test your eye movements by asking you to quickly change positions to trigger the nystagmus. This may be called the Dix-Hallpike test, head thrust test, or roll test. You may be referred to a health care provider who specializes in ear, nose, and throat (ENT) problems (otolaryngologist) or a provider who specializes in disorders of the central nervous system (neurologist). You may have additional testing, including:  A physical exam.  Blood  tests.  MRI.  A CT scan.  An electrocardiogram (ECG). This records electrical activity in your heart.  An electroencephalogram (EEG). This records electrical activity in your brain.  Hearing tests. How is this treated? Treatment for this condition depends on the cause and the severity of the symptoms. Treatment options include:  Medicines to treat nausea or vertigo. These are usually used for severe cases. Some medicines that are used to treat other conditions may also reduce or eliminate vertigo symptoms. These include: ? Medicines that control allergies (antihistamines). ? Medicines that control seizures (anticonvulsants). ? Medicines that relieve depression (antidepressants). ? Medicines that relieve anxiety (sedatives).  Head movements to adjust your inner ear back to normal. If your vertigo is caused by an ear problem, your health care provider may recommend certain movements to correct the problem.  Surgery. This is rare. Follow these instructions at home: Safety  Move slowly.Avoid sudden body or head movements.  Avoid driving.  Avoid operating heavy machinery.  Avoid doing any tasks that would cause danger to you or others if you would have a vertigo episode during the task.  If you have trouble walking or keeping your balance, try using a cane for stability. If you feel dizzy or unstable, sit down right away.  Return to your normal activities as told by your health care provider. Ask your health care provider what activities are safe for you. General instructions  Take over-the-counter and prescription medicines only as told by your health care provider.  Avoid certain positions or movements as told by your health care provider.  Drink enough fluid to keep your urine clear or pale yellow.  Keep all follow-up visits as told by your health care provider. This is important. Contact a health care provider if:  Your medicines do not relieve your vertigo or they make  it worse.  You have a fever.  Your condition gets worse or you develop new symptoms.  Your family or friends notice any behavioral changes.  Your nausea or vomiting gets worse.  You have numbness or a "pins and needles" sensation in part of your body. Get help right away if:  You have difficulty moving or speaking.  You are always dizzy.  You faint.  You develop severe headaches.  You have weakness in your hands, arms, or legs.  You have changes in your hearing or vision.  You develop a stiff neck.  You develop sensitivity to light. This information is not intended to replace advice given to you by your health care provider. Make sure you discuss any questions you have with your health care provider. Document Released: 12/12/2004 Document Revised: 08/16/2015 Document Reviewed: 06/27/2014 Elsevier Interactive Patient Education  2019 [employer].

## 2018-04-27 NOTE — Progress Notes (Signed)
[facility]    Provider:  Dr Lucia GaskinsAhern Referring Provider: Leland HerYoo, Elsia J, DO Primary Care Provider:  Leland HerYoo, Elsia J, DO  CC:  Vertigo  HPI:  Ashley Torres is a 63 y.o. female here as requested by provider Leland HerYoo, Elsia J, DO for vertigo. PMHx blind left eye, congenital rubella syndrome, obesity, hypothyroidism, HTN, HLD, deafness, anxiety, aortic stenosis. Here with a sign language interpreter. On December 13th the whole room was spinning. She  thought I was in a ride. Nausea nad vomiting. Medicine has helped. Meclizine. No symptoms since. She got up too fast, had to go to the bathroom and everything was crazy. She called 911. She sleeps in a recliner. She declined an MRI at the ED. She doesn't like being in close places. Vertigo happened that one time and not since.  The whole episode lasted for a few hours  But the spinning was a few minutes it was very short. Ambulance came.  +nausea and vomiting, Will send to PT to teach her the epley manauvers. She had pressure in her ears at the time. No other focal neurologic deficits, associated symptoms, inciting events or modifiable factors.  Reviewed labs: bmp unremarkable with dehydration   Review of Systems: Patient complains of symptoms per HPI as well as the following symptoms: headache, dizziness. Pertinent negatives and positives per HPI. All others negative.   Social History   Socioeconomic History  . Marital status: Single    Spouse name: Not on file  . Number of children: Not on file  . Years of education: some college  . Highest education level: Not on file  Occupational History    Comment: unemployed  Social Needs  . Financial resource strain: Not on file  . Food insecurity:    Worry: Not on file    Inability: Not on file  . Transportation needs:    Medical: Not on file    Non-medical: Not on file  Tobacco Use  . Smoking status: Never Smoker  . Smokeless tobacco: Never Used  Substance and Sexual Activity  .  Alcohol use: Never    Frequency: Never  . Drug use: Never  . Sexual activity: Never  Lifestyle  . Physical activity:    Days per week: Not on file    Minutes per session: Not on file  . Stress: Not on file  Relationships  . Social connections:    Talks on phone: Not on file    Gets together: Not on file    Attends religious service: Not on file    Active member of club or organization: Not on file    Attends meetings of clubs or organizations: Not on file    Relationship status: Not on file  . Intimate partner violence:    Fear of current or ex partner: Not on file    Emotionally abused: Not on file    Physically abused: Not on file    Forced sexual activity: Not on file  Other Topics Concern  . Not on file  Social History Narrative   Lives alone   Right handed   Caffeine: chocolate only, not often    Family History  Problem Relation Age of Onset  . Other Mother        killed in gas explosion  . Asthma Mother   . Heart murmur Father   . Arthritis Father   . Hyperlipidemia Father   . GER disease Father   . Arthritis Brother   . Sinusitis Brother   .  Diabetes Paternal Grandmother   . Rheumatic fever Paternal Grandmother   . Breast cancer Paternal Aunt   . Heart disease Neg Hx     Past Medical History:  Diagnosis Date  . Allergic rhinitis   . Anxiety   . Aortic stenosis   . Aphakia of left eye   . Bicuspid aortic valve   . Chronic back pain 2015  . Chronic sinusitis   . Deafness congenital   . Drusen of right optic disc   . Environmental and seasonal allergies   . Gastric stress ulcer   . GERD (gastroesophageal reflux disease) 1990  . H/O aortic coarctation repair   . Hyperlipidemia 1997  . Hypertension 1997  . Hypothyroidism   . Nuclear sclerosis of right eye   . Obesity   . Rubella syndrome, congenital   . S/P AVR (aortic valve replacement)   . Scoliosis   . Uterine prolapse   . Vision impairment   . Vision loss, left eye     Patient Active  Problem List   Diagnosis Date Noted  . Benign paroxysmal positional vertigo 04/27/2018  . Healthcare maintenance 09/30/2017  . Seborrheic keratoses 12/13/2016  . Bicuspid aortic valve 04/21/2016  . H/O aortic valve replacement 01/13/2016  . H/O coarctation of aorta 01/13/2016  . HTN (hypertension) 01/13/2016  . Cervical prolapse 09/14/2014  . Nuclear sclerosis of right eye 06/29/2014  . Aphakia of left eye 06/29/2014  . Chronic maxillary sinusitis 02/20/2014  . Deafness congenital 10/28/2013  . Drusen of right optic disc 10/19/2013  . Rubella syndrome, congenital 07/18/2013  . Obesity, Class II, BMI 35-39.9 07/17/2013  . Vision impairment 07/17/2013  . Scoliosis 07/17/2013  . Hypothyroidism (acquired) 07/17/2013  . HLD (hyperlipidemia) 07/17/2013  . GERD (gastroesophageal reflux disease) 07/17/2013  . Environmental and seasonal allergies 07/17/2013  . Back pain 07/17/2013    Past Surgical History:  Procedure Laterality Date  . AORTIC VALVE REPLACEMENT  2017  . breast biopsies     2001, 2002, & 2005   . BREAST EXCISIONAL BIOPSY Left   . CARDIAC CATHETERIZATION    . COARCTATION OF AORTA REPAIR     repaired at age 89  . 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 THYROID SURGERY  1997   benign mass    Current Outpatient Medications  Medication Sig Dispense Refill  . acetaminophen (TYLENOL) 500 MG tablet Take 500 mg by mouth daily as needed for moderate pain.     Marland Kitchen. amoxicillin (AMOXIL) 500 MG tablet Take 500 mg by mouth. As needed prior to dentist for heart protection    . aspirin EC 81 MG tablet Take 81 mg by mouth daily.    . Calcium Carb-Cholecalciferol [phone] MG-UNIT CAPS Take 2 tablets by mouth daily.    . calcium carbonate (TUMS - DOSED IN MG ELEMENTAL CALCIUM) 500 MG chewable tablet Chew 1 tablet by mouth 4 (four) times daily.    . chlorpheniramine (CHLOR-TRIMETON) 4 MG tablet Take 4 mg by mouth as needed for allergies.    . famotidine (PEPCID) 20 MG  tablet Take 1 tablet (20 mg total) by mouth 2 (two) times daily. 60 tablet 3  . FOLIC ACID PO Take 1 tablet by mouth daily.    . hydrochlorothiazide (HYDRODIURIL) 25 MG tablet Take 1 tablet (25 mg total) by mouth daily. 90 tablet 3  . levothyroxine (SYNTHROID, LEVOTHROID) 100 MCG tablet TAKE 1 TABLET BY MOUTH DAILY BEFORE BREAKFAST 90 tablet 3  .  lisinopril (PRINIVIL,ZESTRIL) 20 MG tablet Take 1 tablet (20 mg total) by mouth daily. 90 tablet 3  . meclizine (ANTIVERT) 25 MG tablet Take 1 tablet (25 mg total) by mouth 3 (three) times daily as needed for dizziness. 30 tablet 3  . Multiple Vitamins-Minerals (MULTIVITAMIN ADULTS 50+ PO) Take by mouth daily.    . Pseudoephedrine-APAP-DM (DAYQUIL PO) Take by mouth as needed (allergies).    . Red Yeast Rice 600 MG CAPS Take 1 capsule (600 mg total) by mouth daily. (Patient taking differently: Take 1 capsule by mouth 2 (two) times daily. )    . vitamin C (ASCORBIC ACID) 250 MG tablet Take 250 mg by mouth daily.    . Vitamin E 100 units TABS Take 1 tablet by mouth daily.    . Albuterol Sulfate (PROAIR RESPICLICK) 108 (90 Base) MCG/ACT AEPB Inhale 1 puff into the lungs every 4 (four) hours as needed. 1 each 2  . budesonide (RHINOCORT AQUA) 32 MCG/ACT nasal spray Place 1 spray into both nostrils daily as needed.    . fexofenadine (ALLEGRA) 180 MG tablet Take 180 mg by mouth daily.    Marland Kitchen GLYCERIN-HYPROMELLOSE-PEG 400 OP Apply 1 drop to eye as needed (Dry eyes).    . Multiple Vitamins-Minerals (OCUVITE PRESERVISION PO) Take 1 tablet by mouth daily.     No current facility-administered medications for this visit.     Allergies as of 04/27/2018 - Review Complete 04/27/2018  Allergen Reaction Noted  . Statins  01/12/2016  . Atorvastatin Other (See Comments) 06/02/2015  . Erythromycin  07/08/2013  . Furosemide  06/02/2015  . Hydrocodone-acetaminophen Itching and Nausea And Vomiting 07/15/2013  . Meloxicam  09/21/2013  . Metoprolol Nausea And Vomiting  06/02/2015  . Montelukast  06/29/2014  . Omeprazole  06/02/2015  . Potassium chloride  06/02/2015    Vitals: BP 121/69 (BP Location: Right Arm, Patient Position: Sitting)   Pulse 80   Ht 4' 9.5" (1.461 m)   Wt 175 lb (79.4 kg)   LMP  (LMP Unknown)   BMI 37.21 kg/m  Last Weight:  Wt Readings from Last 1 Encounters:  04/27/18 175 lb (79.4 kg)   Last Height:   Ht Readings from Last 1 Encounters:  04/27/18 4' 9.5" (1.461 m)     Physical exam: Exam: Gen: NAD, conversant, well nourised, obese, well groomed                     CV: RRR, no MRG. No Carotid Bruits. + peripheral edema, warm, nontender Eyes: Conjunctivae clear without exudates or hemorrhage  Neuro: Detailed Neurologic Exam  Speech:    Speech is normal; fluent and spontaneous with normal comprehension.  Cognition:    The patient is oriented to person, place, and time;     recent and remote memory intact;     language fluent;     normal attention, concentration,     fund of knowledge Cranial Nerves:    The pupils are equal, round, and reactive to light. Attempted fundoscopy could not visualize. Visual fields are full to finger confrontation. Exotropia of the left eye, blind in the left eye can see shadows and finger waving. Extraocular movements are intact. Trigeminal sensation is intact and the muscles of mastication are normal. The face is symmetric. The palate elevates in the midline. Hearing impaired . Voice is normal. Shoulder shrug is normal. The tongue has normal motion without fasciculations.   Coordination:    Normal finger to nose and heel to  shin.  Gait:    Heel-toe and tandem gait are normal.   Motor Observation:    No asymmetry, no atrophy, and no involuntary movements noted. Tone:    Normal muscle tone.    Posture:    Posture is normal. normal erect    Strength:    Strength is V/V in the upper and lower limbs.      Sensation: intact to LT     Reflex Exam:  DTR's:    Deep tendon  reflexes in the upper and lower extremities are symmetrical bilaterally.   Toes:    The toes are downgoing bilaterally.   Clonus:    Clonus is absent.    Assessment/Plan:  63 year old with an episode of vertigo. Resolved. She declines further testing. Likely BPPV  - Dr. Haroldine Laws ENT to look in ears, she has wax on the right with inflamed canal and pain in the ear, she says it hurts when the otoscope is placed in the ear -Physical Therapy to teach exercises for Vertigo such as epley maneuver should this occur again -Meclizine as needed for vertigo -Can consider MRI of the brain or CaT scan of the head for other causes such as schwannoma, stroke: she declines   Orders Placed This Encounter  Procedures  . Ambulatory referral to ENT    Referral Priority:   Routine    Referral Type:   Consultation    Referral Reason:   Specialty Services Required    Referred to Provider:   Keturah Barre, MD    Requested Specialty:   Otolaryngology    Number of Visits Requested:   1   Meds ordered this encounter  Medications  . meclizine (ANTIVERT) 25 MG tablet    Sig: Take 1 tablet (25 mg total) by mouth 3 (three) times daily as needed for dizziness.    Dispense:  30 tablet    Refill:  3    Cc: Leland Her, DO,    A total of 45 minutes was spent face-to-face with this patient. Over half this time was spent on counseling patient on the  1. Benign paroxysmal positional vertigo, unspecified laterality   2. Impacted cerumen of right ear   3. Right ear pain    diagnosis and different diagnostic and therapeutic options, counseling and coordination of care, risks ans benefits of management, compliance, or risk factor reduction and education.     Naomie Dean, MD  [facility] [address] [place], [place] [address]  Phone [phone] Fax [phone]

## 2018-05-08 ENCOUNTER — Encounter

## 2018-05-18 ENCOUNTER — Other Ambulatory Visit: Payer: Self-pay | Admitting: Family Medicine

## 2018-05-18 DIAGNOSIS — K219 Gastro-esophageal reflux disease without esophagitis: Secondary | ICD-10-CM

## 2018-09-24 ENCOUNTER — Other Ambulatory Visit: Payer: Medicare Other

## 2018-09-29 ENCOUNTER — Telehealth: Payer: Self-pay | Admitting: Physician Assistant

## 2018-09-29 DIAGNOSIS — Q251 Coarctation of aorta: Secondary | ICD-10-CM

## 2018-09-29 DIAGNOSIS — Z952 Presence of prosthetic heart valve: Secondary | ICD-10-CM

## 2018-09-29 DIAGNOSIS — Q231 Congenital insufficiency of aortic valve: Secondary | ICD-10-CM

## 2018-09-29 DIAGNOSIS — Z8774 Personal history of (corrected) congenital malformations of heart and circulatory system: Secondary | ICD-10-CM

## 2018-09-29 NOTE — Telephone Encounter (Signed)
Dr. Meda Coffee and Erline Levine, this pt wants to cancel her CT Angio scheduled for 7/16, due to not wanting to wear her mask, and she is deaf and requires an interpreter, and fears the communication will be affected with mask on. Please advise if this is ok to cancel.

## 2018-09-29 NOTE — Telephone Encounter (Signed)
New message:    Patient calling stating that she dose not wont her up coming appt. On 07/16 she states that the mask she has a hard time wearing them and it would be hard for her to read the interpreter lips and most of all covid she would not feel safe.

## 2018-09-29 NOTE — Telephone Encounter (Signed)
CT ordered under Dr. Francesca Oman recommendations per Melina Copa, PA

## 2018-09-30 NOTE — Telephone Encounter (Signed)
That's ok, just please keep on the list for later date, thank you

## 2018-10-01 ENCOUNTER — Inpatient Hospital Stay: Admission: RE | Admit: 2018-10-01 | Payer: Medicare Other | Source: Ambulatory Visit

## 2018-10-01 NOTE — Telephone Encounter (Signed)
Orders placed and pt is scheduled for a BMET on 06/28/2019 and CT Angio Chest Aorta for 07/05/2019. Pt made aware of appt dates and times by CT scheduler.

## 2018-10-01 NOTE — Telephone Encounter (Signed)
-----   Message from Osvaldo Shipper, [place] sent at 10/01/2018  4:01 PM EDT ----- Regarding: RE: R/S her CT Angio Chest Aorta  for spring 2021 per Dr Berta Minor you will need to put in a new order the old order exp also pt will need lab appt she will have BMET done on April 12 CT April 19 [place]  ----- Message ----- From: Nuala Alpha, LPN Sent: 09/21/2374   3:18 PM EDT To: Osvaldo Shipper, [place] Subject: R/S her CT Angio Chest Aorta  for spring 202#  Hey Erline Levine hope you are well.  Miss seeing you.  Dr. Meda Coffee wanted me to reach out to see if we could get this pt rescheduled for her CT Angio Chest Aorta for next late Winter or early Spring 2021, when she will feel more comfortable with COVID situation and numbers? Just want to get it on the books and hopefully by then she will commit. Just let me know what you think.  Thanks, [employer]

## 2018-10-01 NOTE — Telephone Encounter (Signed)
Ashley Torres, per Dr Meda Coffee that's ok for her to cancel.  Is there any way you can call her and schedule this for next spring?

## 2018-10-02 ENCOUNTER — Encounter: Payer: Medicare Other | Admitting: Family Medicine

## 2018-10-15 ENCOUNTER — Encounter: Payer: Medicare Other | Admitting: Family Medicine

## 2018-11-03 ENCOUNTER — Encounter: Payer: Self-pay | Admitting: Family Medicine

## 2018-11-03 ENCOUNTER — Ambulatory Visit (INDEPENDENT_AMBULATORY_CARE_PROVIDER_SITE_OTHER): Payer: Medicare HMO | Admitting: Family Medicine

## 2018-11-03 ENCOUNTER — Other Ambulatory Visit: Payer: Self-pay

## 2018-11-03 VITALS — BP 118/62 | HR 88 | Wt 178.0 lb

## 2018-11-03 DIAGNOSIS — E890 Postprocedural hypothyroidism: Secondary | ICD-10-CM

## 2018-11-03 DIAGNOSIS — E039 Hypothyroidism, unspecified: Secondary | ICD-10-CM | POA: Diagnosis not present

## 2018-11-03 DIAGNOSIS — J3089 Other allergic rhinitis: Secondary | ICD-10-CM | POA: Diagnosis not present

## 2018-11-03 MED ORDER — PROAIR RESPICLICK 108 (90 BASE) MCG/ACT IN AEPB: 1.0000 | INHALATION_SPRAY | RESPIRATORY_TRACT | 2 refills | Status: DC | PRN

## 2018-11-03 NOTE — Progress Notes (Signed)
   Subjective:   Patient ID: Idelle Leech, female    DOB: 04/28/1955, 63 y.o.   MRN: [id number]    CC: Ashley Torres is a 63 y.o. female who presents to the [facility] today for palpitations History taken from in person sign language interpretor for this clinic visit.  HPI: Main concern for patient today is her on going palpations since May 2020. She explained that she has had her thyroid gland surgically removed in 1997 (she is unsure why) and since then has been taking Levothyroxine. Her PCP usually adjusts the dose based on her symptoms and TSH levels. She would like her TSH levels checked today so I can change her medication dose. Otherwise has been well with no new concerns. Would like albuterol refilled as she is running out. Has used it frequently due to allergy season.  Denies chest pain, SOB, dizziness, abdo pain, urinary sx or change in bowel habit.   Smoking status reviewed   ROS: pertinent noted in the HPI   Past medical history, surgical, family, and social history reviewed and updated in the EMR as appropriate. Reviewed problem list.   Objective:  BP 118/62   Pulse 88   Wt 80.7 kg   LMP  (LMP Unknown)   SpO2 97%   BMI 37.85 kg/m   Vitals and nursing note reviewed  General: NAD, pleasant, able to participate in exam Cardiac: RRR, S1 S2 present. normal heart sounds, no murmurs. Respiratory: CTAB, normal effort, No wheezes, rales or rhonchi Extremities: no edema or cyanosis. Skin: warm and dry, no rashes noted, peripheral edema +1 bilaterally  Neuro: alert, no obvious focal deficits Psych: Normal affect and mood   Assessment & Plan:    Hypothyroidism (acquired) Iatrogenic hypothyroidism Last TSH was 2.18 on 10/01/18. Repeat TSH today. If abnormal, we will consider changing her thyroid dose. If normal, patient should be considered for referral to her cardiologist given that pt has complex cardiac issues: coarctation of aorta, aortic valve replacement. Can also do EKG  on next visit if still persistent.  Lattie Haw, MD  [place] PGY-1

## 2018-11-03 NOTE — Assessment & Plan Note (Addendum)
Iatrogenic hypothyroidism Last TSH was 2.18 on 10/01/18. Repeat TSH today. If abnormal, we will consider changing her thyroid dose. If normal, patient should be considered for referral to her cardiologist given that pt has complex cardiac issues: coarctation of aorta, aortic valve replacement. Can also do EKG on next visit if still persistent.

## 2018-11-03 NOTE — Patient Instructions (Signed)
Ashley Torres, It was nice to see you in clinic today. You came to see me because you have been having palpitations on Synthroid 152mcg for the last 3 months.  We did a TSH level in the clinic today. I will write to you with the result. If it is normal you will need to see your cardiologist. If it is abnormal I will change the dose for you.  Kind regards Dr Lattie Haw

## 2018-11-04 ENCOUNTER — Encounter: Payer: Self-pay | Admitting: Family Medicine

## 2018-11-04 LAB — TSH: TSH: 2.51 u[IU]/mL (ref 0.450–4.500)

## 2018-11-04 NOTE — Progress Notes (Signed)
   Subjective:    Patient ID: Idelle Leech, female    DOB: 06-24-1955, 63 y.o.   MRN: [id number]   CC:  HPI:   Smoking status reviewed   ROS: pertinent noted in the HPI    Past medical history, surgical, family, and social history reviewed and updated in the EMR as appropriate. Reviewed problem list.   Objective:  LMP  (LMP Unknown)   Vitals and nursing note reviewed  General: NAD, pleasant, able to participate in exam Cardiac: RRR, S1 S2 present. normal heart sounds, no murmurs. Respiratory: CTAB, normal effort, No wheezes, rales or rhonchi Extremities: no edema or cyanosis. Skin: warm and dry, no rashes noted Neuro: alert, no obvious focal deficits Psych: Normal affect and mood   Assessment & Plan:    No problem-specific Assessment & Plan notes found for this encounter.   Lattie Haw, MD  [place] PGY-1

## 2018-11-09 ENCOUNTER — Other Ambulatory Visit: Payer: Self-pay | Admitting: *Deleted

## 2018-11-09 ENCOUNTER — Encounter: Payer: Self-pay | Admitting: Family Medicine

## 2018-11-09 DIAGNOSIS — I10 Essential (primary) hypertension: Secondary | ICD-10-CM

## 2018-11-09 DIAGNOSIS — E039 Hypothyroidism, unspecified: Secondary | ICD-10-CM

## 2018-11-10 MED ORDER — LEVOTHYROXINE SODIUM 100 MCG PO TABS: ORAL_TABLET | ORAL | 3 refills | Status: DC

## 2018-11-10 MED ORDER — MECLIZINE HCL 25 MG PO TABS: 25.0000 mg | ORAL_TABLET | Freq: Three times a day (TID) | ORAL | 3 refills | Status: DC | PRN

## 2018-11-10 MED ORDER — HYDROCHLOROTHIAZIDE 25 MG PO TABS: 25.0000 mg | ORAL_TABLET | Freq: Every day | ORAL | 3 refills | Status: DC

## 2018-11-10 MED ORDER — LISINOPRIL 20 MG PO TABS: 20.0000 mg | ORAL_TABLET | Freq: Every day | ORAL | 3 refills | Status: DC

## 2018-11-24 ENCOUNTER — Telehealth: Payer: Self-pay | Admitting: Family Medicine

## 2018-11-24 NOTE — Telephone Encounter (Signed)
Patient came into office to bring in her [place] forms and to fill out her change of PCP form. Forms placed in white team folder.

## 2018-11-25 NOTE — Telephone Encounter (Signed)
Clinical info completed on [employer] form.  Place form in Dr. Serita Grit box for completion.  Ashley Torres, CMA

## 2018-12-07 NOTE — Telephone Encounter (Signed)
Patient calling to check on [employer] forms. She thought they would be completed by now. They are non longer in doctors box. Please call patient to discuss this. These forms must be completed by September 30th. Please advise

## 2018-12-09 NOTE — Telephone Encounter (Signed)
My apologies for the delay. I put the completed form in the mail today. I will let the patient know.

## 2018-12-14 ENCOUNTER — Telehealth: Payer: Self-pay | Admitting: Family Medicine

## 2018-12-14 NOTE — Telephone Encounter (Signed)
The patient endorsed dissatisfaction with her current PCP after her most recent clinic visit. She felt communication was not as anticipated, given that she has a hearing impairment. She requested a PCP switch.  We will switch her to Dr. Zettie Cooley, who agreed to take over the patient's care.  I advised her that she is allowed a one-time switch only according to our PCP switch policy. She verbalized understanding.  NB: An interpreter was used for this conversation.

## 2018-12-15 ENCOUNTER — Telehealth: Payer: Self-pay | Admitting: Family Medicine

## 2018-12-15 NOTE — Telephone Encounter (Signed)
[employer] Professional Verification For rm dropped off for at front desk for completion.  Verified that patient section of form has been completed.  Last DOS/WCC with PCP was 11/03/18  Placed form in team folder to be completed by clinical staff.  Ashley Torres

## 2018-12-15 NOTE — Telephone Encounter (Signed)
Pt is calling and said that the form Dr. Posey Pronto completed and mailed out was not done correctly. There is a part missing that needs to be completed. Pt said the deadline for the form is tomorrow. She said she was coming to the office to get the form taken care of today. I informed her she could drop the new form off but we can not guarantee the form will be completed by tomorrow.   Pt asked that I send note to let the doctor know that she was coming.

## 2018-12-16 NOTE — Telephone Encounter (Signed)
Reviewed, completed, and signed form.  Note routed to RN team inbasket and placed completed form in Clinic RN's office (wall pocket above desk). I have already faxed the paper work over. Please check to see if they have received.   Note:  This paperwork was difficult to fill out as I have personally not seen the patient, so clinical information taken from chart and remainder of questions answered at the best of my ability. I tried to call the patient, but there was no answer. I left a message and informed her that I was sending the form and answering the best I could as I had never seen her. She should call back should she have any further questions.    Wilber Oliphant, MD

## 2018-12-16 NOTE — Telephone Encounter (Signed)
Clinical info completed on *[employer] form.  Place form in Dr. Julianne Rice box for completion.  Ottis Stain, CMA

## 2018-12-16 NOTE — Telephone Encounter (Signed)
Form put in PCP's box. Dr. Posey Pronto is not in clinic today. Ottis Stain, CMA

## 2018-12-17 NOTE — Telephone Encounter (Signed)
LMOVM of Ashley Torres @ [facility] [phone]) asking her to return call to check status.   Form is in [place] folder on my desk. Christen Bame, CMA

## 2019-01-04 ENCOUNTER — Ambulatory Visit (INDEPENDENT_AMBULATORY_CARE_PROVIDER_SITE_OTHER): Payer: Medicare HMO | Admitting: Family Medicine

## 2019-01-04 ENCOUNTER — Other Ambulatory Visit: Payer: Self-pay

## 2019-01-04 ENCOUNTER — Encounter: Payer: Self-pay | Admitting: Family Medicine

## 2019-01-04 DIAGNOSIS — H6121 Impacted cerumen, right ear: Secondary | ICD-10-CM

## 2019-01-04 DIAGNOSIS — L918 Other hypertrophic disorders of the skin: Secondary | ICD-10-CM | POA: Diagnosis not present

## 2019-01-04 DIAGNOSIS — Z23 Encounter for immunization: Secondary | ICD-10-CM

## 2019-01-04 NOTE — Patient Instructions (Signed)
Dear Ashley Torres,   It was good to meet you! Thank you for taking your time to come in to be seen. Today, we discussed the following:   Cerumen Impaction and skin tag  Skin Tag Removal   Please make an appointment for skin tag removal and we can get it done at your earliest convenience!   Be well,   Zettie Cooley, M.D   [facility] [phone]  *Sign up for MyChart for instant access to your health profile, labs, orders, upcoming appointments or to contact your provider with questions*  ===================================================================================

## 2019-01-05 ENCOUNTER — Encounter: Payer: Self-pay | Admitting: Family Medicine

## 2019-01-05 DIAGNOSIS — H6121 Impacted cerumen, right ear: Secondary | ICD-10-CM | POA: Insufficient documentation

## 2019-01-05 NOTE — Progress Notes (Signed)
  Subjective  CC: Cerumen Impaction and skin tag  Ashley Torres is a 63 y.o. female who presents today with the following problems:  Ear wax removal Patient reports that she is here for ear wax removal. She reports that she has had discomfort to the area and would like it cleaned out. Denies ear pain.   Pertinent P/F/SHx: Congential rubella syndrome, deafness, HTN, seborrheic keratoses ROS: Pertinent ROS included in HPI. Objective  Physical Exam:  BP 118/60   Pulse 87   Wt 176 lb (79.8 kg)   LMP  (LMP Unknown)   SpO2 96%   BMI 37.43 kg/m  General: Well appearing white female with ASL interpretor present, NAD Right ear: external ear normal to palpation and observation. Patient has incomplete occlusion of ear canal with cerumen.   Procedure Ceruminosis is noted.  Wax is removed by syringing and manual debridement. Instructions for home care to prevent wax buildup are given.  Assessment & Plan    Problem List Items Addressed This Visit      Nervous and Auditory   Ceruminosis, right    Wax successfully removed.  Patient tolerated well.        Musculoskeletal and Integument   Skin tag    Patient is requesting skin tag removal.  Patient is agreeable to come back for another appointment as he will not have time to do that today.       Other Visit Diagnoses    Need for immunization against influenza       Relevant Orders   Flu Vaccine QUAD 36+ mos IM (Completed)     There are no preventive care reminders to display for this patient.  Wilber Oliphant, M.D.  PGY-2  Family Medicine  [phone] 01/05/2019 10:47 AM

## 2019-01-05 NOTE — Assessment & Plan Note (Signed)
Wax successfully removed.  Patient tolerated well.

## 2019-01-05 NOTE — Assessment & Plan Note (Signed)
Patient is requesting skin tag removal.  Patient is agreeable to come back for another appointment as he will not have time to do that today.

## 2019-01-19 ENCOUNTER — Other Ambulatory Visit: Payer: Self-pay | Admitting: Family Medicine

## 2019-01-19 DIAGNOSIS — Z1231 Encounter for screening mammogram for malignant neoplasm of breast: Secondary | ICD-10-CM

## 2019-03-02 ENCOUNTER — Encounter: Payer: Self-pay | Admitting: Family Medicine

## 2019-03-02 ENCOUNTER — Telehealth (INDEPENDENT_AMBULATORY_CARE_PROVIDER_SITE_OTHER): Payer: Medicare HMO | Admitting: Family Medicine

## 2019-03-02 ENCOUNTER — Other Ambulatory Visit: Payer: Self-pay

## 2019-03-02 DIAGNOSIS — J019 Acute sinusitis, unspecified: Secondary | ICD-10-CM

## 2019-03-02 DIAGNOSIS — B9689 Other specified bacterial agents as the cause of diseases classified elsewhere: Secondary | ICD-10-CM

## 2019-03-02 MED ORDER — AMOXICILLIN-POT CLAVULANATE 875-125 MG PO TABS: 1.0000 | ORAL_TABLET | Freq: Two times a day (BID) | ORAL | 0 refills | Status: DC

## 2019-03-02 NOTE — Assessment & Plan Note (Signed)
Will treat with 875 mg/125 mg orally twice daily x 7 days.   Return if worsening.

## 2019-03-02 NOTE — Progress Notes (Signed)
[facility] [place] Telemedicine Visit  Patient consented to have virtual visit. 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 Sometimes its dry and sometimes wet. Pressure in the forehead and cheek bones. One side breathes okay, and then the other side blocks up. Neck and head hurt and has been using heating pad. Is having green and yellow snot. She hasn't been able to do anything and has had difficulty focusing. Sometimes the eyes turn red she thinks from allergies on top of the sinus infection. Typically treated with Augmentin. Denies fevers and chills.   ROS: per HPI  Pertinent PMHx: HLD, HTN, hypothyroidism  Exam:  Spoke via ALS interpretor over the phone.   Assessment/Plan: Acute bacterial rhinosinusitis Will treat with 875 mg/125 mg orally twice daily x 7 days.   Return if worsening.     Time spent during visit with patient: 12 minutes  Wilber Oliphant, M.D.  1:53 PM 03/02/2019

## 2019-03-10 NOTE — Telephone Encounter (Signed)
error 

## 2019-04-06 ENCOUNTER — Ambulatory Visit
Admission: RE | Admit: 2019-04-06 | Discharge: 2019-04-06 | Disposition: A | Discharge status: Home / Self Care | Payer: Medicare HMO | Source: Ambulatory Visit | Attending: Pediatrics | Admitting: Pediatrics

## 2019-04-06 ENCOUNTER — Other Ambulatory Visit: Payer: Self-pay

## 2019-04-06 DIAGNOSIS — Z1231 Encounter for screening mammogram for malignant neoplasm of breast: Secondary | ICD-10-CM

## 2019-04-06 IMAGING — MG DIGITAL SCREENING BILAT W/ TOMO W/ CAD
6 of 10 series · 6 of 30 positions shown · non-contrast
Comparison: Previous exam(s).

CLINICAL DATA: Screening.

EXAM:
DIGITAL SCREENING BILATERAL MAMMOGRAM WITH TOMO AND CAD

[L MLO synth-2D (1 of 2)]
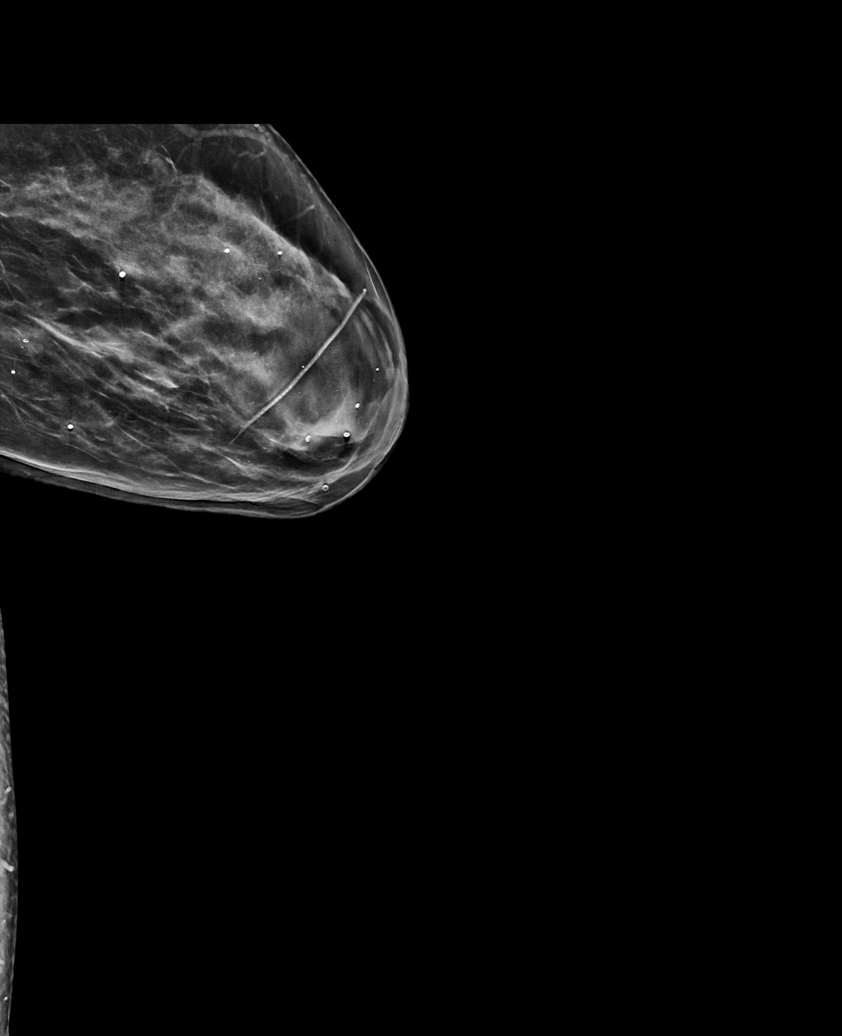

[R MLO synth-2D]
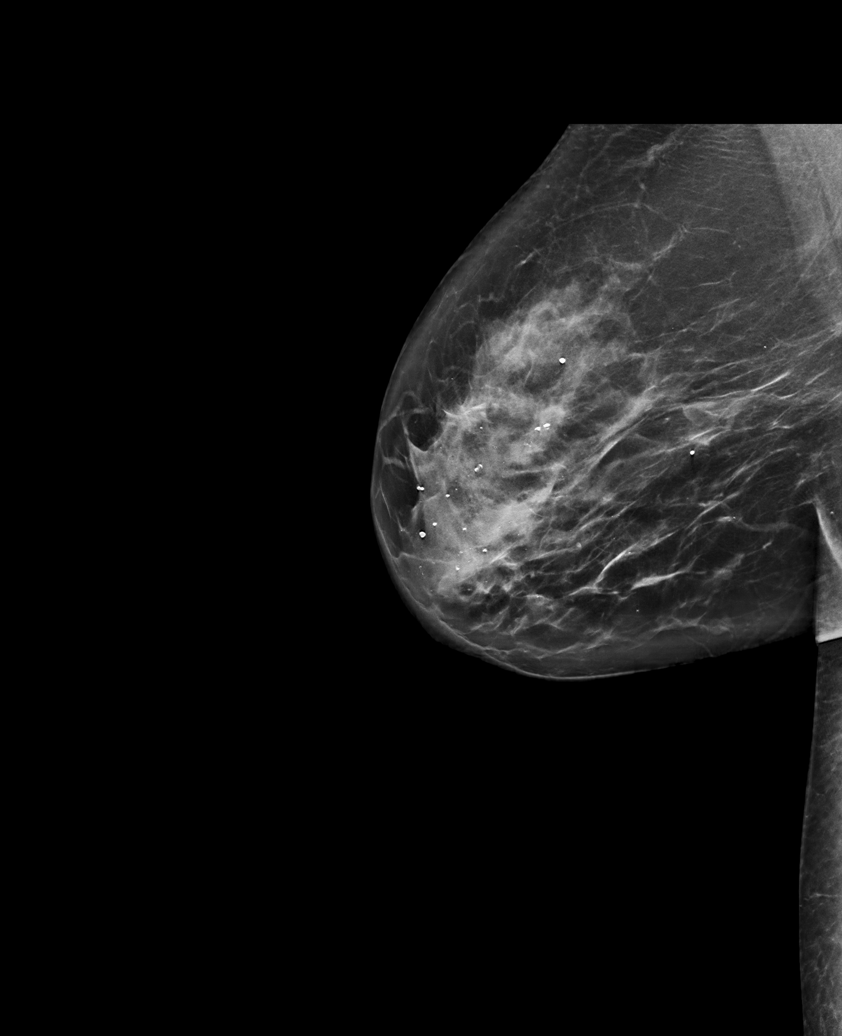

[R CC synth-2D]
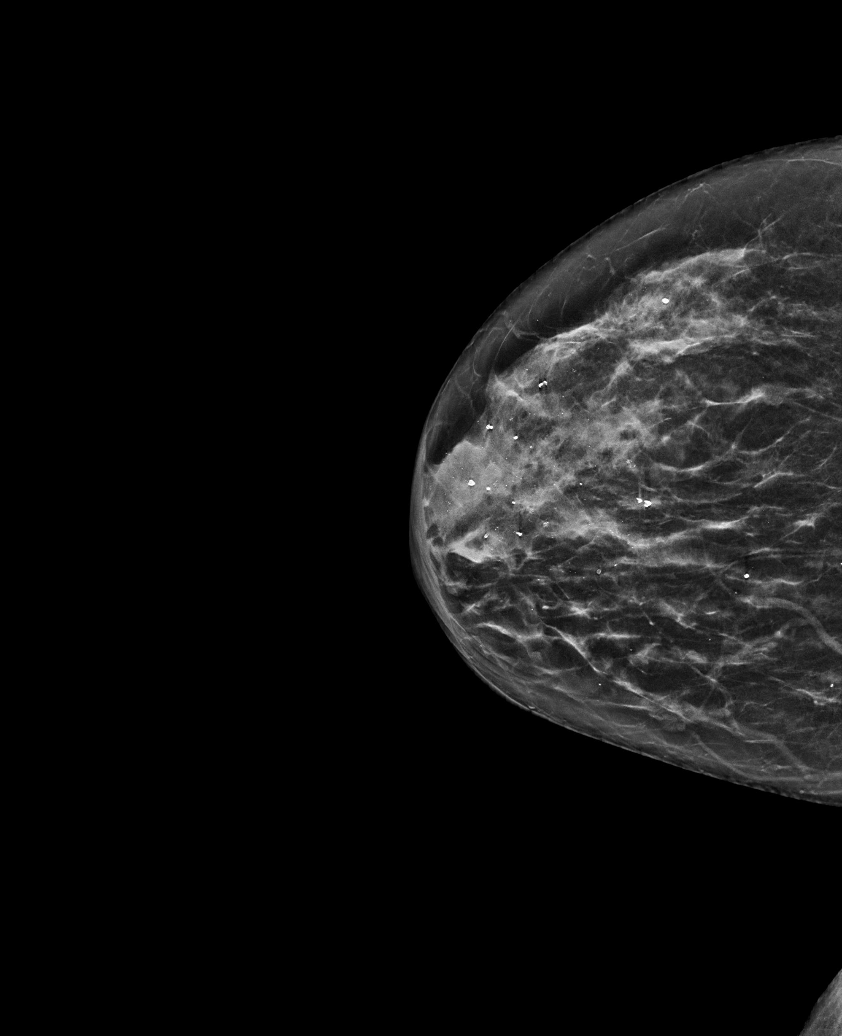

[L MLO synth-2D (2 of 2)]
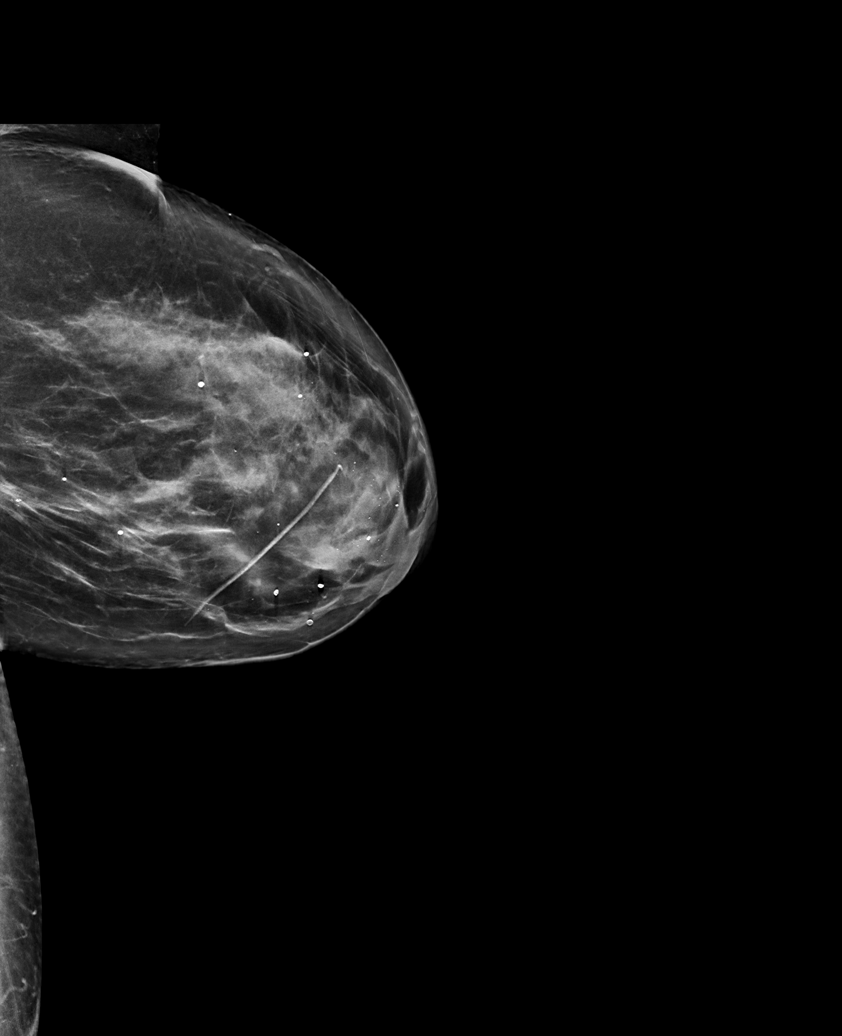

[L CC synth-2D]
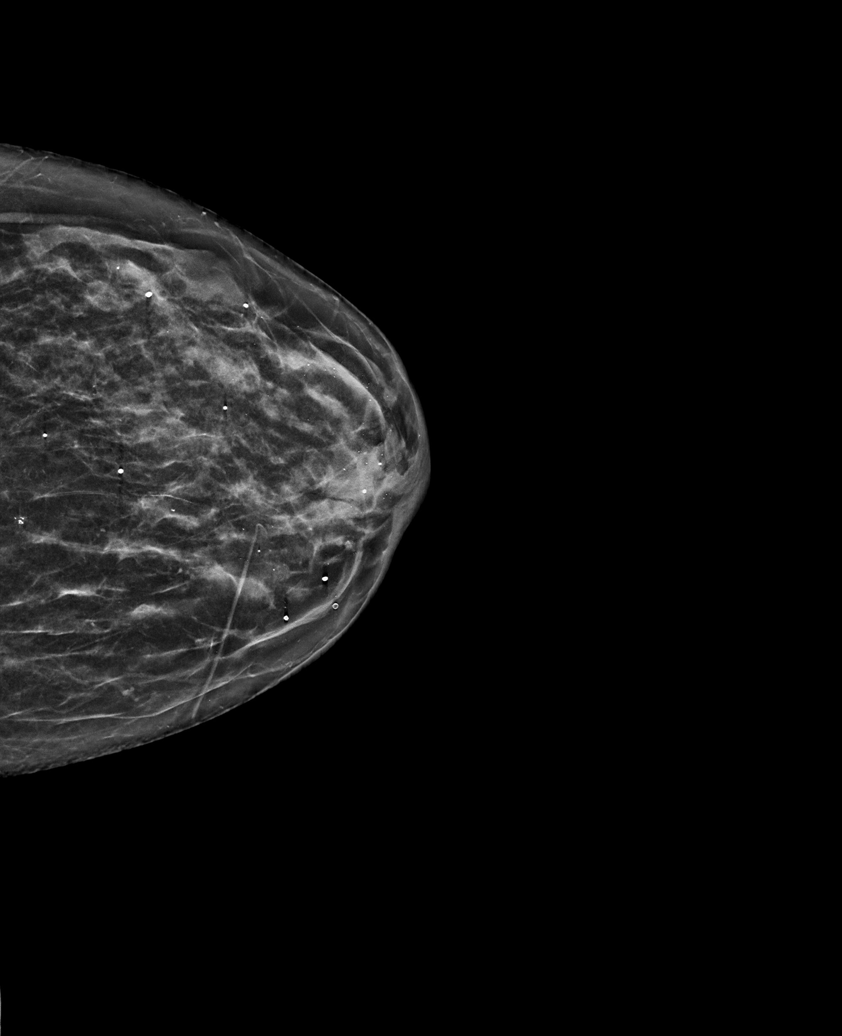

[R MLO tomo · tomo slice 41/81.0]
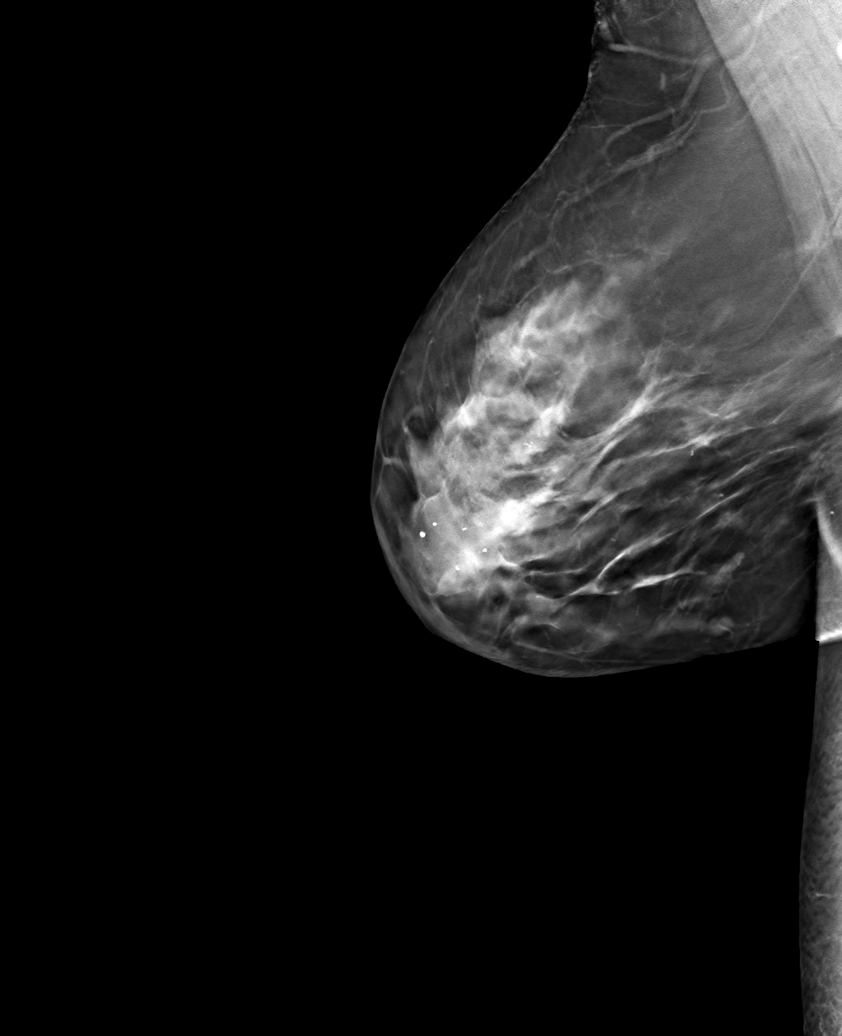

[6 of 30 positions shown; findings below may reference images not displayed]

ACR Breast Density Category d: The breast tissue is extremely dense,
which lowers the sensitivity of mammography
FINDINGS: There are no findings suspicious for malignancy. Images were
processed with CAD.
IMPRESSION: No mammographic evidence of malignancy. A result letter of this
screening mammogram will be mailed directly to the patient.

RECOMMENDATION:
Screening mammogram in one year. (Code:[5I])

BI-RADS CATEGORY  1: Negative.

## 2019-04-14 ENCOUNTER — Ambulatory Visit: Payer: Medicare HMO | Admitting: Cardiology

## 2019-04-19 DIAGNOSIS — H5201 Hypermetropia, right eye: Secondary | ICD-10-CM | POA: Diagnosis not present

## 2019-04-19 DIAGNOSIS — Z0100 Encounter for examination of eyes and vision without abnormal findings: Secondary | ICD-10-CM | POA: Diagnosis not present

## 2019-04-22 ENCOUNTER — Telehealth: Payer: Self-pay

## 2019-04-22 NOTE — Telephone Encounter (Signed)
Pt calls nurse line regarding sinus infection symptoms. Patient reports pain and pressure in sinus cavities and yellow mucus. Pt denies having fever, cough, SHOB.   Scheduled virtual appointment tomorrow morning.   To PCP  Veronda Prude, RN

## 2019-04-23 ENCOUNTER — Telehealth (INDEPENDENT_AMBULATORY_CARE_PROVIDER_SITE_OTHER): Payer: Medicare HMO | Admitting: Family Medicine

## 2019-04-23 ENCOUNTER — Other Ambulatory Visit: Payer: Self-pay

## 2019-04-23 DIAGNOSIS — J3089 Other allergic rhinitis: Secondary | ICD-10-CM | POA: Diagnosis not present

## 2019-04-23 DIAGNOSIS — Z20822 Contact with and (suspected) exposure to covid-19: Secondary | ICD-10-CM | POA: Diagnosis not present

## 2019-04-23 MED ORDER — PROAIR RESPICLICK 108 (90 BASE) MCG/ACT IN AEPB: 1.0000 | INHALATION_SPRAY | RESPIRATORY_TRACT | 2 refills | Status: DC | PRN

## 2019-04-23 NOTE — Assessment & Plan Note (Addendum)
Patient with  symtpoms concerning for covid-19. Patient reports cough, congestion, and headache.  Denies SOB, CP.  Patient does not have a way to get to testing center given she is not able to drive, would only be able to take over.  Advised her not to take Benedetto Goad to get tested at this was exposed them if she is positive.  Given that we are unable to test, will need to quarantine.  Remote Covid monitoring was called and information given to see if she can be assisted given no confirmed COVID test. -Counseled on wearing a mask, washing hands and avoiding social gatherings  -ED precautions discussed and patient expressed good understanding -Patient instructed to avoid others until they meet criteria for ending isolation after any suspected COVID, which are:  -24 hours with no fever (without use of medicaitons) and -respiratory symptoms have improved (e.g. cough, shortness of breath) with -10 days since symptoms first appeared

## 2019-04-23 NOTE — Progress Notes (Signed)
[facility] Telemedicine Visit  Patient consented to have virtual visit. 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 female with the following complaints:  Had a horrible headache 2 days ago, feeling a lot of eye pain and sinus pressure.  She is having yellow and orange discharge with coughing.  Yesterday, she felt a little bit better, but was still having a headache and pressure.  Feels like it is coming back again.  She states that she thinks she has a lot of roaches in her apartment that might be contributing.  No fevers.  A little bit of a cough, which is chronic.  Cough is productive per above.  Has a runny nose, worse in the AM.  Notes sneezing as well.  Breathing comfortably.  Needs another inhaler, she has not been using frequently, last used two days ago.  No known sick contacts.  She is social distancing and keeping a mask on at all times, but some people around her wear her mask incorretly.  No known COVID contacts.  Neighbors upstairs in the building have COVID, but she is not around them.  Was tested for COVID in June at her apartment complex for hearing impaired but was negative.    ROS: per HPI  Pertinent PMHx: HTN, GERD, Hypothyroidism, Vision impaired, hearing impaired  Exam:  Respiratory: speaking through sign language interpretor, unable to assess  Assessment/Plan:  Encounter by telehealth for suspected COVID-19 Patient with  symtpoms concerning for covid-19. Patient reports cough, congestion, and headache.  Denies SOB, CP.  Patient does not have a way to get to testing [facility] given she is not able to drive, would only be able to take over.  Advised her not to take Benedetto Goad to get tested at this was exposed them if she is positive.   Given that we are unable to test, will need to quarantine.  Remote Covid monitoring was called and information given to see if she can be assisted given no confirmed COVID test. -Counseled on wearing a mask, washing hands and avoiding social gatherings  -ED precautions discussed and patient expressed good understanding -Patient instructed to avoid others until they meet criteria for ending isolation after any suspected COVID, which are:  -24 hours with no fever (without use of medicaitons) and -respiratory symptoms have improved (e.g. cough, shortness of breath) with -10 days since symptoms first appeared     Time spent during visit with patient: 17 minutes

## 2019-04-24 DIAGNOSIS — Z1159 Encounter for screening for other viral diseases: Secondary | ICD-10-CM | POA: Diagnosis not present

## 2019-04-30 ENCOUNTER — Other Ambulatory Visit: Payer: Medicare HMO

## 2019-05-03 ENCOUNTER — Telehealth: Payer: Self-pay | Admitting: Physician Assistant

## 2019-05-03 ENCOUNTER — Other Ambulatory Visit: Payer: Self-pay

## 2019-05-03 DIAGNOSIS — Z952 Presence of prosthetic heart valve: Secondary | ICD-10-CM

## 2019-05-03 DIAGNOSIS — Q231 Congenital insufficiency of aortic valve: Secondary | ICD-10-CM

## 2019-05-03 DIAGNOSIS — Q251 Coarctation of aorta: Secondary | ICD-10-CM

## 2019-05-03 NOTE — Telephone Encounter (Addendum)
See below - at time of this staff message I had recommended patient have her yearly OV to help decide what other labs she would need before having the CT, based on prior interaction with the patient. She was previously supposed to f/u in 6 months but I do not see this occurred. She was therefore scheduled with Dr. Delton See 04/14/19 but it appears cancelled her appointment. I just saw CT order come back across and it appears this has been rescheduled with a BMET only order again. I worry that patient will get upset that she is only getting BMET not all other labs that may be needed, so might be beneficial as below to have OV before CT as requested rather than the other way around. That way we can discuss what labs are needed so she can avoid being stuck twice. Jarrah Babich

## 2019-05-03 NOTE — Telephone Encounter (Signed)
The patient's CT has been cancelled. CT scheduler left a message for the patient to call back to confirm.

## 2019-05-03 NOTE — Telephone Encounter (Addendum)
-----   Message ----- From: Ethelda Chick, RN Sent: 04/12/2019   4:01 PM EST To: Laurann Montana, PA-C, Elliot Cousin, RMA Subject: FW: Lab ?                                      Does this patient need any other labs other than BMET? ----- Message ----- From: Rayfield Citizen, NT Sent: 04/12/2019   3:16 PM EST To: [facility] Triage Subject: Lab ?                                          Patient called with translator to move up appointment for CT and Lab. Patient is wanting to have "any lab test she needs at the lab appointment on 2/5. I explained I only see order for BMET and she stated she wanted the "everything lab" and I was not listening to her she did not want to come back twice. If patient needs to have any other labs drawn will you please reach out to let patient know.  Patient also wanted to make the office aware she change her PCP to DR Selena Batten.  Thank you   Misty Stanley

## 2019-05-04 ENCOUNTER — Telehealth: Payer: Self-pay | Admitting: Physician Assistant

## 2019-05-04 NOTE — Telephone Encounter (Signed)
New Message:     Pt  wanted you to know her insurance said she need prior authorization for her CT and any other test.

## 2019-05-05 ENCOUNTER — Other Ambulatory Visit: Payer: Medicare HMO

## 2019-05-07 ENCOUNTER — Inpatient Hospital Stay: Admission: RE | Admit: 2019-05-07 | Payer: Medicare HMO | Source: Ambulatory Visit

## 2019-05-10 ENCOUNTER — Encounter: Payer: Self-pay | Admitting: Family Medicine

## 2019-05-10 ENCOUNTER — Telehealth (INDEPENDENT_AMBULATORY_CARE_PROVIDER_SITE_OTHER): Payer: Medicare HMO | Admitting: Family Medicine

## 2019-05-10 ENCOUNTER — Telehealth: Payer: Self-pay

## 2019-05-10 ENCOUNTER — Other Ambulatory Visit: Payer: Medicare HMO

## 2019-05-10 ENCOUNTER — Other Ambulatory Visit: Payer: Self-pay

## 2019-05-10 VITALS — Wt 178.0 lb

## 2019-05-10 DIAGNOSIS — J3489 Other specified disorders of nose and nasal sinuses: Secondary | ICD-10-CM | POA: Diagnosis not present

## 2019-05-10 NOTE — Telephone Encounter (Signed)
Patient calls nurse line stating her insurance no longer covers [employer], however they now will cover [employer]. Patient does not need any at this time, just wanted to have on record.

## 2019-05-11 DIAGNOSIS — J3489 Other specified disorders of nose and nasal sinuses: Secondary | ICD-10-CM | POA: Insufficient documentation

## 2019-05-11 NOTE — Progress Notes (Signed)
    SUBJECTIVE:   CHIEF COMPLAINT / HPI: Sinus Pressure  Sinus Pressure  Patient states she has had sinus pressure with associated headache for the past week. She has had this before and states it feels similar to when she had sinusitis in the past and typically occurs with weather changes. Before this most recent episode she most recently had symptoms in the fall but it self-resolved and this episode seems more intense and is lasting longer. She has tried Tylenol, Dayquil, and used her inhaler but it has only given modest relief. She has associated productive cough with whitish phlegm. No fever, chills, difficulty breathing, or shortness of breath. She states she has tried nasal sprays in the past but it made her "inflammation worse" and Rhinocort "caused an infection". She has not been taking her allegra.  Of note, patient is deaf and entire visit was conducted over the phone via interpreter.  PERTINENT  PMH / PSH: HTN, Chronic maxillary sinusitis, acute bacterial sinusitis, hypothyroidism  OBJECTIVE:   Wt 178 lb (80.7 kg)   LMP  (LMP Unknown)   BMI 37.85 kg/m   Unable to perform as encounter was conducted over the phone via interpreter  ASSESSMENT/PLAN:   Sinus pressure Most likely combination of allergies and/or viral URI. Unlikely bacterial but given history I did consider starting antibiotics. Given her poor response to inhalers in the past I will not start Flonase. - Restart Allegra - Cont OTC symptom management - If no improvement in 7 days instructed patient to call and I will start her on Amoxicillin for possible bacterial sinusitis     Arlyce Harman, DO [facility]

## 2019-05-11 NOTE — Assessment & Plan Note (Signed)
Most likely combination of allergies and/or viral URI. Unlikely bacterial but given history I did consider starting antibiotics. Given her poor response to inhalers in the past I will not start Flonase. - Restart Allegra - Cont OTC symptom management - If no improvement in 7 days instructed patient to call and I will start her on Amoxicillin for possible bacterial sinusitis

## 2019-05-12 ENCOUNTER — Other Ambulatory Visit: Payer: Medicare HMO

## 2019-05-14 ENCOUNTER — Other Ambulatory Visit: Payer: Self-pay | Admitting: *Deleted

## 2019-05-14 ENCOUNTER — Telehealth: Payer: Self-pay

## 2019-05-14 ENCOUNTER — Other Ambulatory Visit: Payer: Self-pay | Admitting: Family Medicine

## 2019-05-14 MED ORDER — AMOXICILLIN 500 MG PO TABS: 500.0000 mg | ORAL_TABLET | Freq: Two times a day (BID) | ORAL | 0 refills | Status: AC

## 2019-05-14 NOTE — Telephone Encounter (Signed)
Patient calls nurse line stating she had a telemed visit with Dr. Karen Chafe on 02/22. Patient stated her sxs have not improved and it hurts to laugh due to pressure between her eyes. Per telemed notes, Lockamy was planning on treating with antibiotic if sxs have not improved. Will forward to him.

## 2019-05-18 ENCOUNTER — Ambulatory Visit: Payer: Medicare HMO | Admitting: Physician Assistant

## 2019-05-20 ENCOUNTER — Telehealth: Payer: Self-pay

## 2019-05-20 NOTE — Telephone Encounter (Signed)
Patient calls and left message on nurse line with complaints of migraine headaches. Patient states that she read online that headaches can be a symptom of COVID and she wanted to call and see if she needs to be tested.   Attempted to return call to patient, no answer, left voicemail for patient to return call to office tomorrow.   Veronda Prude, RN

## 2019-06-16 ENCOUNTER — Telehealth (INDEPENDENT_AMBULATORY_CARE_PROVIDER_SITE_OTHER): Payer: Medicare HMO | Admitting: Family Medicine

## 2019-06-16 ENCOUNTER — Other Ambulatory Visit: Payer: Self-pay

## 2019-06-16 DIAGNOSIS — K5900 Constipation, unspecified: Secondary | ICD-10-CM | POA: Insufficient documentation

## 2019-06-16 NOTE — Assessment & Plan Note (Signed)
Likely due to lack of dietary fiber given patient does not eat many fruits or vegetables, recommended increasing daily fiber and water intake.  Also recommended daily MiraLAX until stools are soft and regular.  Can try Metamucil if she does not tolerate MiraLAX.  Return precautions discussed such as strangulation of her ventral hernia, vomiting, nausea, fevers, blood in stool.  Patient verbalized understanding.

## 2019-06-16 NOTE — Progress Notes (Signed)
[facility] Telemedicine Visit  Patient consented to have virtual visit. 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  She now reports difficulties with constipation for the past 2-3 weeks.  She is concerned she may have Covid.  She tested negative for Covid around the second week of March.  Reports she normally has bowel movement every day.  Last bowel movement was this morning.  Prior to this last bowel movement was 2-3 days ago.  Denies nausea, vomiting, fevers, shortness of breath, chest pain, blood in stool, dark tarry stools, dysuria.  She does report some occasional congestion for which she uses an albuterol inhaler.  She denies any prior abdominal surgeries.  She has a ventral hernia but denies any pain in this area.  She is able to reduce her hernia.  She is postmenopausal and denies vaginal bleeding.  She does not like to eat many vegetables but does enjoy fruit.  She reports she is drinking good amount of water.  She has not tried any medications for her constipation.  She previously tried MiraLAX years ago during a prior hospitalization for constipation but had lots of immediate stool output with this so she was leery to try this.  ROS: per HPI  Pertinent PMHx: Bicuspid aortic valve with h/o coarctation of aorta s/p AVR, HTN, GERD, hypothyroidism, deafness, scoliosis, obesity, HLD  Exam:  Unable to assess as communication was through some language interpreter.  Assessment/Plan:  Constipation Likely due to lack of dietary fiber given patient does not eat many fruits or vegetables, recommended increasing daily fiber and water intake.  Also recommended daily MiraLAX  until stools are soft and regular.  Can try Metamucil if she does not tolerate MiraLAX.  Return precautions discussed such as strangulation of her ventral hernia, vomiting, nausea, fevers, blood in stool.  Patient verbalized understanding.    Time spent during visit with patient: 29 minutes

## 2019-06-28 ENCOUNTER — Other Ambulatory Visit: Payer: Self-pay

## 2019-06-28 ENCOUNTER — Other Ambulatory Visit: Payer: Medicare Other

## 2019-06-28 ENCOUNTER — Other Ambulatory Visit: Payer: Medicare HMO | Admitting: *Deleted

## 2019-06-28 DIAGNOSIS — Z8774 Personal history of (corrected) congenital malformations of heart and circulatory system: Secondary | ICD-10-CM | POA: Diagnosis not present

## 2019-06-28 DIAGNOSIS — Q231 Congenital insufficiency of aortic valve: Secondary | ICD-10-CM | POA: Diagnosis not present

## 2019-06-28 DIAGNOSIS — Z952 Presence of prosthetic heart valve: Secondary | ICD-10-CM

## 2019-06-28 DIAGNOSIS — Q251 Coarctation of aorta: Secondary | ICD-10-CM

## 2019-06-29 ENCOUNTER — Telehealth: Payer: Self-pay | Admitting: *Deleted

## 2019-06-29 LAB — BASIC METABOLIC PANEL
BUN/Creatinine Ratio: 24 (ref 12–28)
BUN: 19 mg/dL (ref 8–27)
CO2: 28 mmol/L (ref 20–29)
Calcium: 9.7 mg/dL (ref 8.7–10.3)
Chloride: 102 mmol/L (ref 96–106)
Creatinine, Ser: 0.8 mg/dL (ref 0.57–1.00)
GFR calc Af Amer: 92 mL/min/{1.73_m2} (ref >59)
GFR calc non Af Amer: 80 mL/min/{1.73_m2} (ref >59)
Glucose: 86 mg/dL (ref 65–99)
Potassium: 3.9 mmol/L (ref 3.5–5.2)
Sodium: 145 mmol/L — ABNORMAL HIGH (ref 134–144)

## 2019-06-29 NOTE — Telephone Encounter (Signed)
Pt has been made aware of her lab results through the Sign Language Interpreter Service. Pt has given understanding to results today. Confirmed appt 07/07/19 10:15 with Ronie Spies, PAC. Pt is concerned about doing a video visit as she uses a program called [employer] which allows for video with her Sign Engineer, technical sales. I assured the pt that we do everything we can to make things easier for her and the provider. The pt thanked me for my time and help. The patient has been notified of the result and verbalized understanding.  All questions (if any) were answered. Danielle Rankin, [facility] 06/29/2019 9:56 AM

## 2019-06-29 NOTE — Telephone Encounter (Signed)
-----   Message from Loa Socks, LPN sent at 0/93/1121  9:33 AM EDT -----  ----- Message ----- From: Lars Masson, MD Sent: 06/29/2019   9:12 AM EDT To: Loa Socks, LPN, [facility] Triage  Normal electrolytes, liver and kidney function.

## 2019-06-30 ENCOUNTER — Inpatient Hospital Stay: Admission: RE | Admit: 2019-06-30 | Payer: Medicare HMO | Source: Ambulatory Visit

## 2019-07-01 ENCOUNTER — Ambulatory Visit
Admission: RE | Admit: 2019-07-01 | Discharge: 2019-07-01 | Disposition: A | Discharge status: Home / Self Care | Payer: Medicare HMO | Source: Ambulatory Visit | Attending: Physician Assistant | Admitting: Physician Assistant

## 2019-07-01 ENCOUNTER — Encounter: Payer: Self-pay | Admitting: Physician Assistant

## 2019-07-01 DIAGNOSIS — Q251 Coarctation of aorta: Secondary | ICD-10-CM

## 2019-07-01 DIAGNOSIS — Q231 Congenital insufficiency of aortic valve: Secondary | ICD-10-CM

## 2019-07-01 DIAGNOSIS — Z952 Presence of prosthetic heart valve: Secondary | ICD-10-CM

## 2019-07-01 IMAGING — CT CT ANGIO CHEST
2 of 6 series · 13 of 36 positions shown · IV contrast (iopamidol)
Comparison: [DATE]

CLINICAL DATA: Bicuspid aortic valve. Coarctation of aorta. Post
aortic valve replacement.

EXAM:
CT ANGIOGRAPHY CHEST WITH CONTRAST
TECHNIQUE: Multidetector CT imaging of the chest was performed using the
standard protocol during bolus administration of intravenous
contrast. Multiplanar CT image reconstructions and MIPs were
obtained to evaluate the vascular anatomy.
CONTRAST:  75mL [QQ] IOPAMIDOL ([QQ]) INJECTION 76%

[Series 5: cta thorax 2.00 bv36 s3 axial arterial · axial · arterial · 0.62mm/px · z∈[+1533,+1753]mm · 12 of 132 slices shown]
[im 11/132  lung]
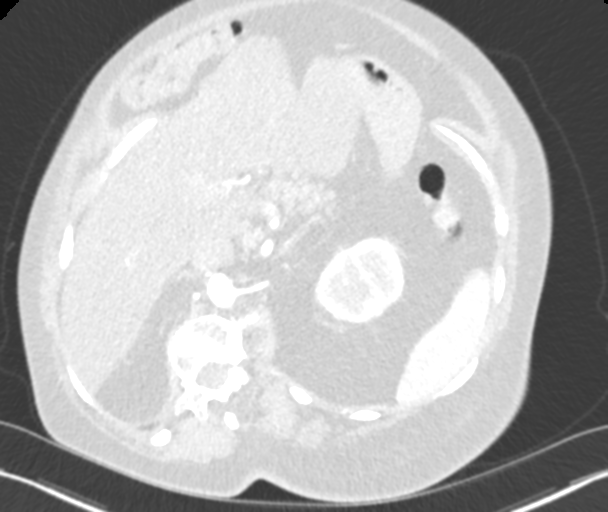
[im 21/132  mediastinal]
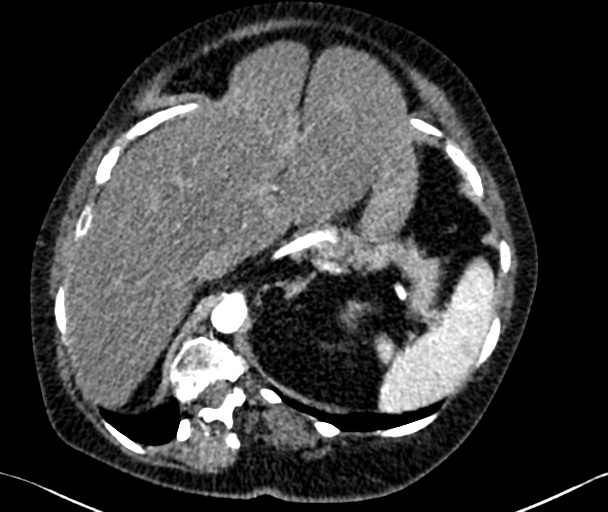
[im 31/132  lung]
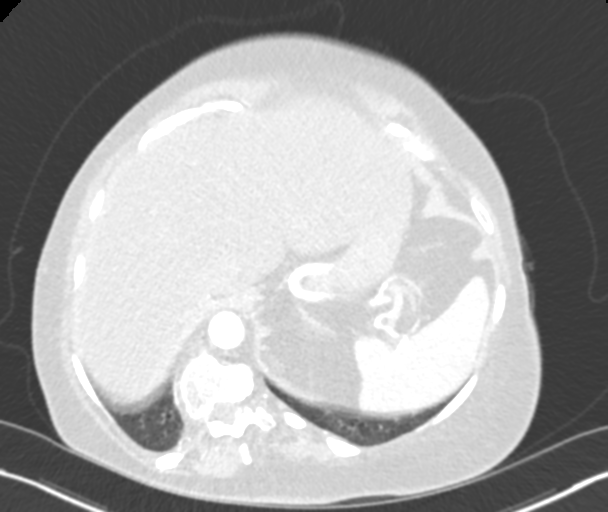
[im 41/132  mediastinal]
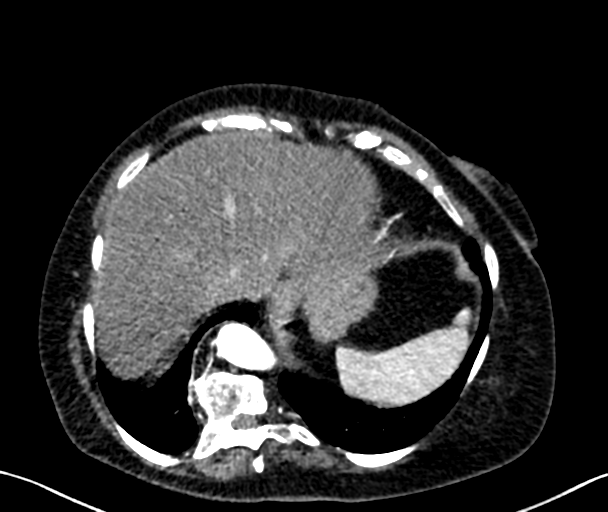
[im 51/132  lung]
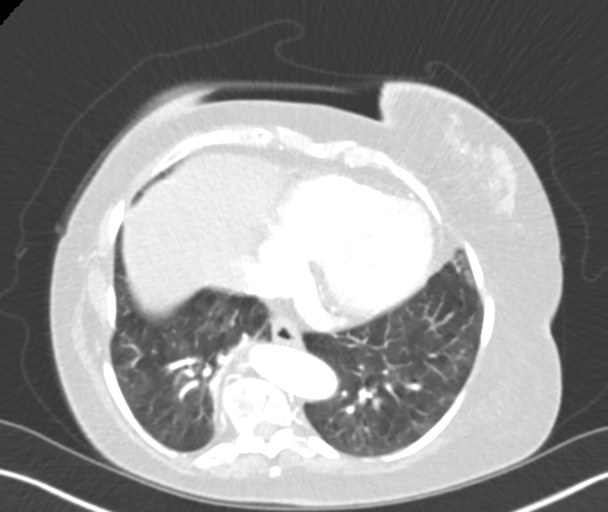
[im 61/132  mediastinal]
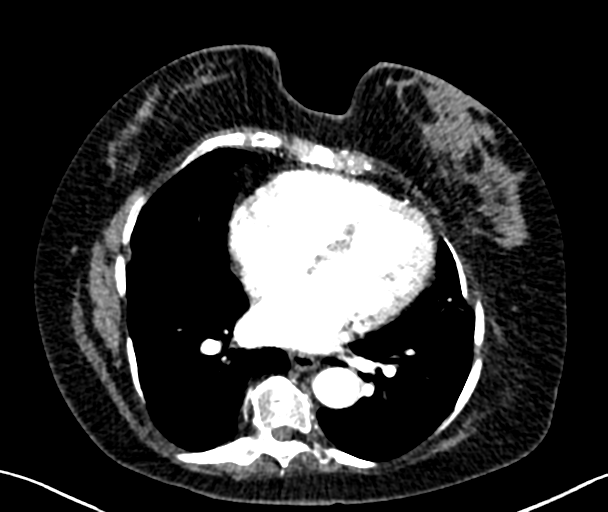
[im 71/132  lung]
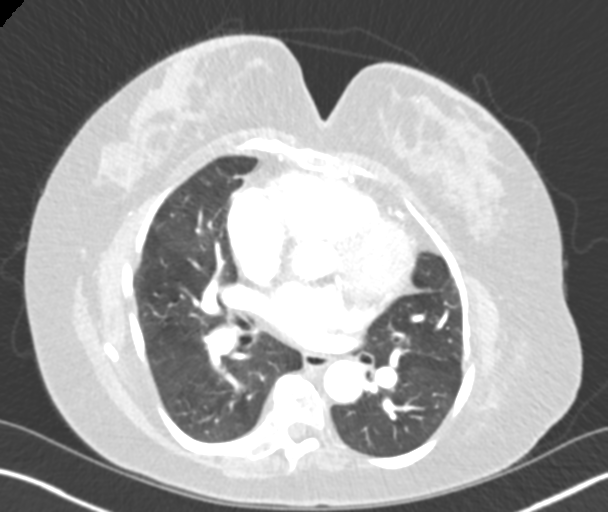
[im 81/132  mediastinal]
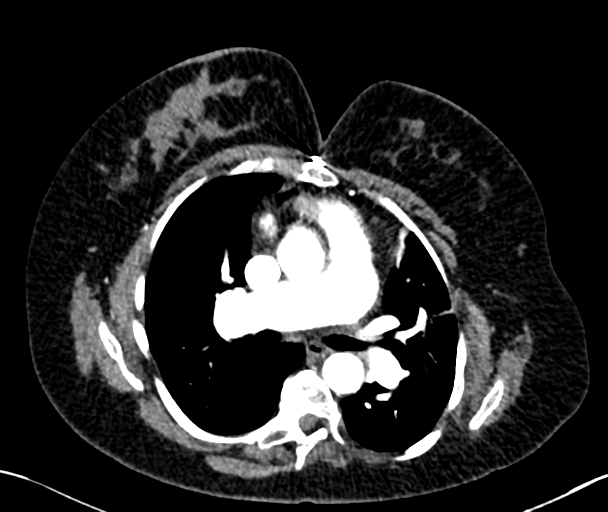
[im 91/132  lung]
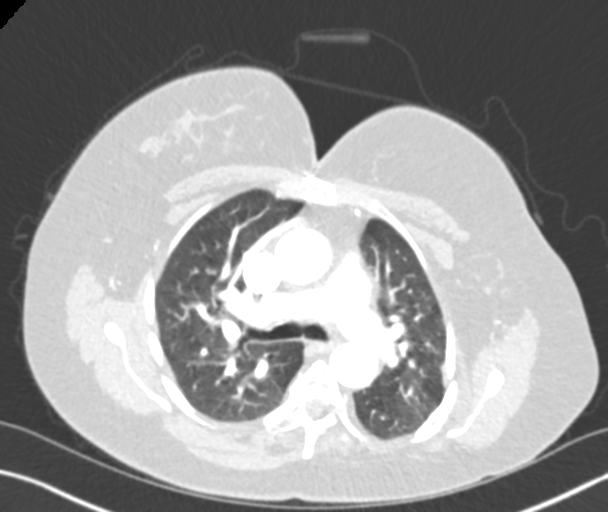
[im 101/132  mediastinal]
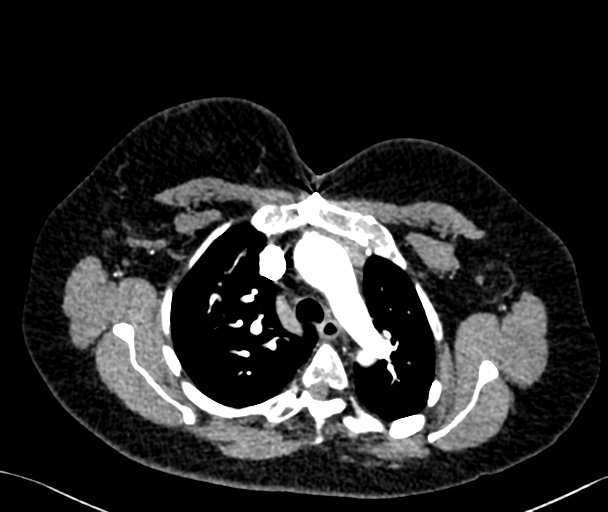
[im 111/132  lung]
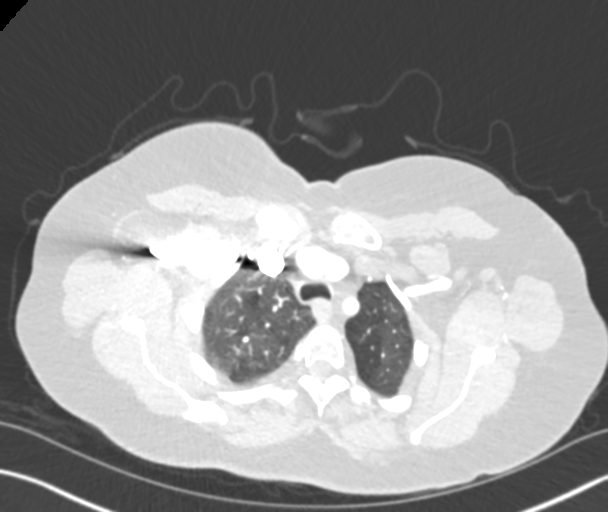
[im 121/132  mediastinal]
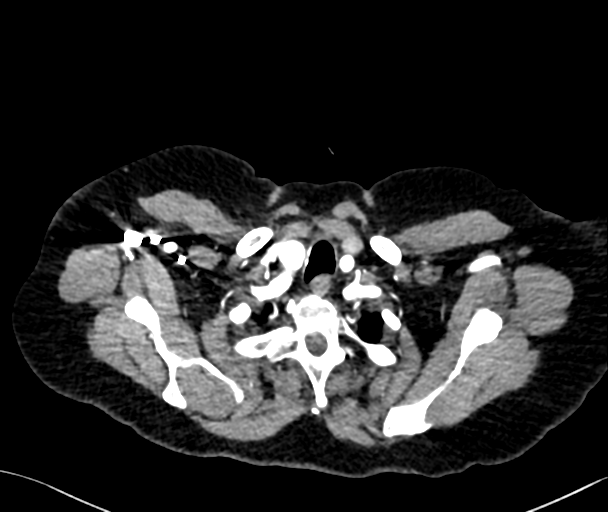

[Series 10: cta thorax 2.00 bv36 s3 cor st · coronal · 0.52mm/px · 1 of 162 slices shown]
[im 81/162  mediastinal]
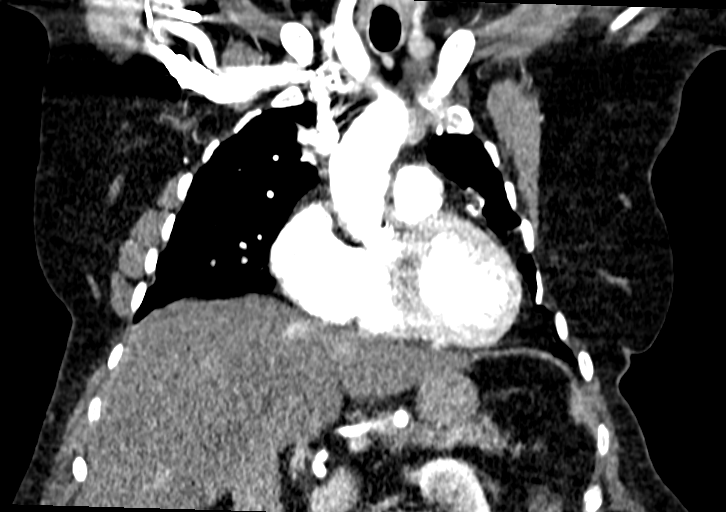

[13 of 36 positions shown; findings below may reference images not displayed]

FINDINGS: Cardiovascular: Mild cardiomegaly unchanged. Evidence of patient's
previous aortic valve replacement. Aortic root and sinotubular
junction are normal at the level of the valve. Ascending thoracic
aorta is unchanged measuring 3.2 cm in AP diameter. Bovine branching
anatomy of the great vessels from the aortic arch. The distal arch
after the takeoff of the subclavian artery measures approximately
11-12 mm in diameter without significant change. Small superior
outpouching of the aortic arch at the region of the coarct repair
measuring approximately 13 x 10 x 7 mm likely representing small
pseudoaneurysm and unchanged. Descending thoracic aorta measures
approximately 2.5 cm in AP diameter just after the coarct repair
unchanged. Distal thoracic aorta somewhat tortuous due in part to
patient's severe scoliosis. Mild stable prominence of the pulmonary
arteries which are otherwise unremarkable. Remaining vascular
structures are unremarkable.

Mediastinum/Nodes: No mediastinal or hilar adenopathy. Remaining
mediastinal structures are unremarkable.

Lungs/Pleura: Lungs are adequately inflated with minimal linear
scarring over the mid to upper lungs. No lobar consolidation or
effusion. Airways are normal.

Upper Abdomen: Stable 7 mm calcification over the upper pole left
kidney. Mild diffuse low-attenuation of the liver compatible with
steatosis. No acute findings.

Musculoskeletal: Severe curvature of the thoracolumbar spine convex
right unchanged. Degenerative changes of the spine.

Review of the MIP images confirms the above findings.
IMPRESSION: 1. Stable postsurgical changes of the distal aortic arch compatible
patient's previous aortic coarctation repair. Stable narrowing at
the repair site of approximately 11-12 mm. Stable small
pseudoaneurysm extending superiorly from the region of the repair
site. Recommend continued follow-up CTA chest as clinically
indicated.

2. Mild cardiomegaly and evidence of previous aortic valve
replacement.

3. 7 mm upper pole left renal calcification unchanged. Hepatic
steatosis.

4.  Stable severe curvature of the thoracic spine convex right.

## 2019-07-01 MED ORDER — IOPAMIDOL (ISOVUE-370) INJECTION 76%
75.0000 mL | Freq: Once | INTRAVENOUS | Status: AC | PRN
  Administered 2019-07-01: 75 mL via INTRAVENOUS

## 2019-07-01 NOTE — Progress Notes (Signed)
Virtual Visit via Telephone Note   This visit type was conducted due to national recommendations for restrictions regarding the COVID-19 Pandemic (e.g. social distancing) in an effort to limit this patient's exposure and mitigate transmission in our community.  Due to her co-morbid illnesses, this patient is at least at moderate risk for complications without adequate follow up.  This format is felt to be most appropriate for this patient at this time.  The patient did not have access to video technology/had technical difficulties with video requiring transitioning to audio format only (telephone).  All issues noted in this document were discussed and addressed.  No physical exam could be performed with this format.  Please refer to the patient's chart for her  consent to telehealth for [facility]   The patient was identified using 2 identifiers.  Date:  07/07/2019   ID:  Ashley Torres, DOB 04-Mar-1956, MRN [id number]  Patient Location: Home Provider Location: Office  PCP:  Melene Plan, MD  Cardiologist:  Tobias Alexander, MD Electrophysiologist:  None   Evaluation Performed:  Follow-Up Visit  Chief Complaint:  Routine f/u valvular disease, coarctation repair  History of Present Illness:    Ashley Torres is a 64 y.o. female with congenital deafness, bicuspid aortic valve status post bioprosthetic aortic valve replacement in 03/2015, coarctation of the aorta status post repair as a child, hypertension, hyperlipidemia, hypothyroidism, chronic lower extremity edema, chronic sinusitis, and GERD who presents for overdue follow-up of AVR and coarctation of the aorta.   She is from [facility], [place]. Per Dr. Lindaann Slough notes, she had coarctation repair around 64 years of age. In 2016 she was found to have severe aortic stenosis. Pre-op cath 02/2015 at [facility]) showed normal coronaries. She subsequently underwent aortic valve replacement with annulus enlargement  (19 mm Magna model 300 Carpentier-Edwards bovine pericardial heart valve) on 04/17/15 at [facility]. Last echo 08/2017 showed moderate focal BSH of the septum, EF 60-65%, grade 1 DD, high VFP, /p AVR with mildly elevated mean gradient of 21 mmHg; no AI; trace TR with mild pulmonary hypertension; elevated velocity in descending aorta (2.7 m/s); suggested CTA or MRA to further assess coarctation. CT scan 09/2017 showed "some expected narrowing of the distal arch and proximal descending thoracic aorta at the level of prior coarctation repair. 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 This can be followed in the future by CTA or MRA. Elevated aortic velocity may be secondary to the underlying smoothly tapered narrowing in combination with significant tortuosity present. The lack of significant collateral arteries may argue against a hemodynamically significant recurrent stenosis at the level of prior repair." Dr. Delton See felt there was some expected narrowing but not significant and recommended continued surveillance. She was seen in the ED 02/2018 with acute on chronic dizziness with unrevealing workup. She declined MRI at that time. Meclizine resolved her symptoms. She was previously offered furosemide for her chronic edema but opted against this. Last labs reviewed include BMET 06/28/19 with sodium 145 otherwise normal, 10/2018 TSH wnl, 02/2018 normal CBC. She had repeat CTA 07/01/19 with findings below, reported to have stable changes.  She is seen virtually via phone. She uses a telephone service called [employer] that connects her directly with an ASL interpreter. We used the interpreter ID [id number] today. She reports she is doing very well without any new symptoms. No CP, SOB, palpitations, dizziness. She  denies any changes with her edema, comes and goes, no worsening or concerns about this. She has not had any  bleeding. She has occasional constipation for which she takes Miralax. She states she discussed routine labwork with her PCP earlier this year who told her she wasn't due for this yet. She has an appointment with them in a few weeks in May. She had a viral infection 3.5 weeks ago with headache, mucus, coughing and tested negative for Covid.   Past Medical History:  Diagnosis Date  . Allergic rhinitis   . Anxiety   . Aortic stenosis   . Aphakia of left eye   . Bicuspid aortic valve   . Chronic back pain 2015  . Chronic edema   . Chronic sinusitis   . Deafness congenital   . Drusen of right optic disc   . Environmental and seasonal allergies   . Gastric stress ulcer   . GERD (gastroesophageal reflux disease) 1990  . H/O aortic coarctation repair   . Hyperlipidemia 1997  . Hypertension 1997  . Hypothyroidism   . Nuclear sclerosis of right eye   . Obesity   . Rubella syndrome, congenital   . S/P AVR (aortic valve replacement)   . Scoliosis   . Uterine prolapse   . Vision impairment   . Vision loss, left eye    Past Surgical History:  Procedure Laterality Date  . AORTIC VALVE REPLACEMENT  2017  . breast biopsies     2001, 2002, & 2005   . BREAST EXCISIONAL BIOPSY Left   . CARDIAC CATHETERIZATION    . COARCTATION OF AORTA REPAIR     repaired at age 53  . 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 THYROID SURGERY  1997   benign mass     Current Meds  Medication Sig  . acetaminophen (TYLENOL) 500 MG tablet Take 500 mg by mouth daily as needed for moderate pain.   . Albuterol Sulfate (PROAIR RESPICLICK) 161 (90 Base) MCG/ACT AEPB Inhale 1 puff into the lungs every 4 (four) hours as needed.  Marland Kitchen aspirin EC 81 MG tablet Take 81 mg by mouth daily.  . Calcium Carb-Cholecalciferol [id number] MG-UNIT CAPS Take 2 tablets by mouth daily.  . calcium carbonate (TUMS - DOSED IN MG ELEMENTAL CALCIUM) 500 MG chewable tablet Chew 1 tablet by mouth 4 (four) times daily.  .  chlorpheniramine (CHLOR-TRIMETON) 4 MG tablet Take 4 mg by mouth as needed for allergies.  . fexofenadine (ALLEGRA) 180 MG tablet Take 180 mg by mouth daily.  . fluticasone (FLONASE) 50 MCG/ACT nasal spray Place 2 sprays into both nostrils daily.  Marland Kitchen FOLIC ACID PO Take 1 tablet by mouth daily.  Marland Kitchen GLYCERIN-HYPROMELLOSE-PEG 400 OP Apply 1 drop to eye as needed (Dry eyes).  . hydrochlorothiazide (HYDRODIURIL) 25 MG tablet Take 1 tablet (25 mg total) by mouth daily.  Marland Kitchen levothyroxine (SYNTHROID) 100 MCG tablet TAKE 1 TABLET BY MOUTH DAILY BEFORE BREAKFAST  . lisinopril (ZESTRIL) 20 MG tablet Take 1 tablet (20 mg total) by mouth daily.  . meclizine (ANTIVERT) 25 MG tablet Take 1 tablet (25 mg total) by mouth 3 (three) times daily as needed for dizziness.  . Multiple Vitamins-Minerals (MULTIVITAMIN ADULTS 50+ PO) Take by mouth daily.  . Multiple Vitamins-Minerals (OCUVITE PRESERVISION PO) Take 1 tablet by mouth daily.  . Pseudoephedrine-APAP-DM (DAYQUIL PO) Take by mouth as needed (allergies).  . Red Yeast Rice 600 MG CAPS Take 1 capsule (600 mg  total) by mouth daily. (Patient taking differently: Take 1 capsule by mouth 2 (two) times daily. )  . vitamin C (ASCORBIC ACID) 250 MG tablet Take 250 mg by mouth daily.  . Vitamin E 100 units TABS Take 1 tablet by mouth daily.  . [DISCONTINUED] amoxicillin-clavulanate (AUGMENTIN) 875-125 MG tablet Take 1 tablet by mouth 2 (two) times daily.  . [DISCONTINUED] budesonide (RHINOCORT AQUA) 32 MCG/ACT nasal spray Place 1 spray into both nostrils daily as needed.  . [DISCONTINUED] famotidine (PEPCID) 20 MG tablet TAKE 1 TABLET(20 MG) BY MOUTH TWICE DAILY     Allergies:   Statins, Atorvastatin, Erythromycin, Furosemide, Hydrocodone-acetaminophen, Meloxicam, Metoprolol, Montelukast, Omeprazole, and Potassium chloride   Social History   Tobacco Use  . Smoking status: Never Smoker  . Smokeless tobacco: Never Used  Substance Use Topics  . Alcohol use: Never  .  Drug use: Never     Family Hx: The patient's family history includes Arthritis in her brother and father; Asthma in her mother; Breast cancer in her paternal aunt; Diabetes in her paternal grandmother; GER disease in her father; Heart murmur in her father; Hyperlipidemia in her father; Other in her mother; Rheumatic fever in her paternal grandmother; Sinusitis in her brother. There is no history of Heart disease.  ROS:   Please see the history of present illness.   Occasional constipation (has seen primary care for this). Had viral infection 3.5 weeks ago with headache, mucus, coughing, tested negative for Covid. Has had vaccine. All other systems reviewed and are negative.   Prior CV studies:   The following studies were reviewed today:  2D Echo 08/2017 - Left ventricle: The cavity size was normal. There was moderate  focal basal hypertrophy of the septum. Systolic function was  normal. The estimated ejection fraction was in the range of 60%  to 65%. Wall motion was normal; there were no regional wall  motion abnormalities. Doppler parameters are consistent with  abnormal left ventricular relaxation (grade 1 diastolic  dysfunction). Doppler parameters are consistent with high  ventricular filling pressure.  - Aortic valve: A bioprosthesis was present.  - Mitral valve: Calcified annulus.  - Pulmonary arteries: Systolic pressure was mildly increased. PA  peak pressure: 44 mm Hg (S).   Impressions:   - Normal LV function; mild diastolic dysfunction; s/p AVR with  mildly elevated mean gradient of 21 mmHg; no AI; trace TR with  mild pulmonary hypertension; elevated velocity in descending  aorta (2.7 m/s); suggest CTA or MRA to further assess  coarctation.   CTA 06/2019 IMPRESSION: 1. Stable postsurgical changes of the distal aortic arch compatible patient's previous aortic coarctation repair. Stable narrowing at the repair site of approximately 11-12 mm.  Stable small pseudoaneurysm extending superiorly from the region of the repair site. Recommend continued follow-up CTA chest as clinically indicated.  2. Mild cardiomegaly and evidence of previous aortic valve replacement.  3. 7 mm upper pole left renal calcification unchanged. Hepatic steatosis.  4.  Stable severe curvature of the thoracic spine convex right.    Labs/Other Tests and Data Reviewed:    EKG:  An ECG dated 04/06/18 was personally reviewed today and demonstrated:  NSR possible LAE, LVH with QRS widening, No significant change.  Recent Labs: 11/03/2018: TSH 2.510 06/28/2019: BUN 19; Creatinine, Ser 0.80; Potassium 3.9; Sodium 145   Recent Lipid Panel Lab Results  Component Value Date/Time   CHOL 234 (H) 09/30/2017 02:41 PM   TRIG 202 (H) 09/30/2017 02:41 PM   HDL 54  09/30/2017 02:41 PM   CHOLHDL 4.3 09/30/2017 02:41 PM   LDLCALC 140 (H) 09/30/2017 02:41 PM   LDLDIRECT 135 (H) 12/13/2016 02:40 PM    Wt Readings from Last 3 Encounters:  07/07/19 178 lb (80.7 kg)  05/10/19 178 lb (80.7 kg)  01/04/19 176 lb (79.8 kg)     Objective:    Vital Signs:  Ht 4' 9.5" (1.461 m)   Wt 178 lb (80.7 kg)   LMP  (LMP Unknown)   BMI 37.85 kg/m    VS reviewed. General - unable to assess as we were using interpreter Pulm - unable to assess as we were using interpreter but no abnormality mentioned in cadence of speech Neuro - using interpreter, speaking in full sentences via interpreter, A+Ox3, calm Psych - Pleasant affect  ASSESSMENT & PLAN:    1. AS s/p AVR 2017 - no recent symptoms to suggest any abnormality. SBE ppx reviewed. Will send in rx for amoxicillin per SBE guidelines. She will continue baby aspirin. She will discuss surveillance CBC with PCP at visit in a few weeks. I also asked her to please have them obtain an EKG at that time for our review. 2. Coarctation repair - stable CT as above. Will have her f/u with Dr. Delton See in 6 months to determine timing of  next follow-up CT scan. 3. Essential HTN - unable to assess BP. I encouraged her to obtain a BP cuff with HR monitor. She will check into getting one and call us when she gets the readings. I told her if she finds she cannot afford it, to let us know and we will get our social worker to assist. She verbalized understanding. 4. Hyperlipidemia - this is followed by primary care. She plans to discuss with them at her next visit in May. She is on red yeast rice at their direction. 5. Chronic edema - she reports stability in symptoms. Unable to assess via phone today, but she will notify for any clinical changes. She previously declined higher potency diuretics. 6. Hepatic steatosis - discussed ancillary findings on CT with patient and advised primary care follow-up. I did my best to answer the questions she asked about this. Dietary modification will be important.   Time:   Today, I have spent 22 minutes with the patient with telehealth technology discussing the above problems.     Medication Adjustments/Labs and Tests Ordered: Current medicines are reviewed at length with the patient today.  Testing and concerns regarding medicines are outlined above. She also requests copy mailed to her with family history which I will ask MA to help with.   Follow Up:  In Person 6 months with Dr. Delton See  Signed, Laurann Montana, PA-C  07/07/2019 10:58 AM    [facility]

## 2019-07-05 ENCOUNTER — Other Ambulatory Visit: Payer: Medicare Other

## 2019-07-06 ENCOUNTER — Telehealth: Payer: Self-pay | Admitting: *Deleted

## 2019-07-06 NOTE — Telephone Encounter (Signed)
Pt gave verbal consent when she was called with her CT results with Sign Language Interpreter.    Patient Consent for Virtual Visit         Ashley Torres has provided verbal consent on 07/06/2019 for a virtual visit (video or telephone).   CONSENT FOR VIRTUAL VISIT FOR:  Ashley Torres  By participating in this virtual visit I agree to the following:  I hereby voluntarily request, consent and authorize [facility] and its employed or contracted physicians, Producer, television/film/video, nurse practitioners or other licensed health care professionals (the Practitioner), to provide me with telemedicine health care services (the "Services") as deemed necessary by the treating Practitioner. I acknowledge and consent to receive the Services by the Practitioner via telemedicine. I understand that the telemedicine visit will involve communicating with the Practitioner through live audiovisual communication technology and the disclosure of certain medical information by electronic transmission. I acknowledge that I have been given the opportunity to request an in-person assessment or other available alternative prior to the telemedicine visit and am voluntarily participating in the telemedicine visit.  I understand that I have the right to withhold or withdraw my consent to the use of telemedicine in the course of my care at any time, without affecting my right to future care or treatment, and that the Practitioner or I may terminate the telemedicine visit at any time. I understand that I have the right to inspect all information obtained and/or recorded in the course of the telemedicine visit and may receive copies of available information for a reasonable fee.  I understand that some of the potential risks of receiving the Services via telemedicine include:  Marland Kitchen Delay or interruption in medical evaluation due to technological equipment failure or disruption; . Information transmitted may not be sufficient (e.g.  poor resolution of images) to allow for appropriate medical decision making by the Practitioner; and/or  . In rare instances, security protocols could fail, causing a breach of personal health information.  Furthermore, I acknowledge that it is my responsibility to provide information about my medical history, conditions and care that is complete and accurate to the best of my ability. I acknowledge that Practitioner's advice, recommendations, and/or decision may be based on factors not within their control, such as incomplete or inaccurate data provided by me or distortions of diagnostic images or specimens that may result from electronic transmissions. I understand that the practice of medicine is not an exact science and that Practitioner makes no warranties or guarantees regarding treatment outcomes. I acknowledge that a copy of this consent can be made available to me via my patient portal [facility] MyChart), or I can request a printed copy by calling the office of [facility].    I understand that my insurance will be billed for this visit.   I have read or had this consent read to me. . I understand the contents of this consent, which adequately explains the benefits and risks of the Services being provided via telemedicine.  . I have been provided ample opportunity to ask questions regarding this consent and the Services and have had my questions answered to my satisfaction. . I give my informed consent for the services to be provided through the use of telemedicine in my medical care

## 2019-07-07 ENCOUNTER — Other Ambulatory Visit: Payer: Self-pay

## 2019-07-07 ENCOUNTER — Encounter: Payer: Self-pay | Admitting: Physician Assistant

## 2019-07-07 ENCOUNTER — Telehealth (INDEPENDENT_AMBULATORY_CARE_PROVIDER_SITE_OTHER): Payer: Medicare HMO | Admitting: Physician Assistant

## 2019-07-07 VITALS — Ht <= 58 in | Wt 178.0 lb

## 2019-07-07 DIAGNOSIS — E785 Hyperlipidemia, unspecified: Secondary | ICD-10-CM

## 2019-07-07 DIAGNOSIS — Z8774 Personal history of (corrected) congenital malformations of heart and circulatory system: Secondary | ICD-10-CM

## 2019-07-07 DIAGNOSIS — Z952 Presence of prosthetic heart valve: Secondary | ICD-10-CM | POA: Diagnosis not present

## 2019-07-07 DIAGNOSIS — K760 Fatty (change of) liver, not elsewhere classified: Secondary | ICD-10-CM

## 2019-07-07 DIAGNOSIS — R609 Edema, unspecified: Secondary | ICD-10-CM

## 2019-07-07 DIAGNOSIS — I10 Essential (primary) hypertension: Secondary | ICD-10-CM | POA: Diagnosis not present

## 2019-07-07 MED ORDER — AMOXICILLIN 500 MG PO TABS: ORAL_TABLET | ORAL | 2 refills | Status: DC

## 2019-07-07 NOTE — Patient Instructions (Addendum)
Medication Instructions:  Your physician recommends that you continue on your current medications as directed. Please refer to the Current Medication list given to you today.  *If you need a refill on your cardiac medications before your next appointment, please call your pharmacy*   Lab Work: None ordered  If you have labs (blood work) drawn today and your tests are completely normal, you will receive your results only by: Marland Kitchen MyChart Message (if you have MyChart) OR . A paper copy in the mail If you have any lab test that is abnormal or we need to change your treatment, we will call you to review the results.   Testing/Procedures: None ordered   Follow-Up: At [facility], you and your health needs are our priority.  As part of our continuing mission to provide you with exceptional heart care, we have created designated Provider Care Teams.  These Care Teams include your primary Cardiologist (physician) and Advanced Practice Providers (APPs -  Physician Assistants and Nurse Practitioners) who all work together to provide you with the care you need, when you need it.  We recommend signing up for the patient portal called "MyChart".  Sign up information is provided on this After Visit Summary.  MyChart is used to connect with patients for Virtual Visits (Telemedicine).  Patients are able to view lab/test results, encounter notes, upcoming appointments, etc.  Non-urgent messages can be sent to your provider as well.   To learn more about what you can do with MyChart, go to [email or web address].    Your next appointment:   6 month(s)  The format for your next appointment:   In Person  Provider:   You may see Tobias Alexander, MD or one of the following Advanced Practice Providers on your designated Care Team:    Ronie Spies, PA-C  Jacolyn Reedy, PA-C    Other Instructions  Endocarditis Information  You may be at risk for developing endocarditis since you have an artificial  heart valve or a repaired heart valve. Endocarditis is an infection of the lining of the heart or heart valves. Certain surgical and dental procedures may put you at risk, such as teeth cleaning or other dental procedures or any surgery involving the respiratory, urinary, gastrointestinal tract, gallbladder or prostate. Notify your doctor or dentist before having any invasive procedures. You will need to take antibiotics before certain procedures. To prevent endocarditis, maintain good oral health. Seek prompt medical attention for any mouth/gum, skin or urinary tract infections.    Please follow up with your primary care provider for your additional CT scan findings which include the fatty liver that was seen. You may also be due for your other routine labwork which includes your blood count and liver function.    Please get a blood pressure cuff that goes on your arm. The wrist ones can be inaccurate. If possible, try to select one that also reports your heart rate. To check your blood pressure, choose a time at least 3 hours after taking your blood pressure medicines. If you want to check it at different times of the day, that's okay too - it might give you more information about how your blood pressure fluctuates. Remain seated in a chair for 5 minutes quietly beforehand, then check it. When you get a cuff, please record a reding s and call us/send in MyChart message with them for our review. If you have difficulty affording this, please let us know and we will notify the social worker.

## 2019-07-20 ENCOUNTER — Encounter: Payer: Self-pay | Admitting: Family Medicine

## 2019-07-20 ENCOUNTER — Ambulatory Visit (INDEPENDENT_AMBULATORY_CARE_PROVIDER_SITE_OTHER): Payer: Medicare HMO | Admitting: Family Medicine

## 2019-07-20 ENCOUNTER — Other Ambulatory Visit: Payer: Self-pay

## 2019-07-20 VITALS — BP 110/68 | HR 80 | Ht <= 58 in | Wt 176.0 lb

## 2019-07-20 DIAGNOSIS — I10 Essential (primary) hypertension: Secondary | ICD-10-CM

## 2019-07-20 DIAGNOSIS — R7401 Elevation of levels of liver transaminase levels: Secondary | ICD-10-CM | POA: Diagnosis not present

## 2019-07-20 DIAGNOSIS — K439 Ventral hernia without obstruction or gangrene: Secondary | ICD-10-CM

## 2019-07-20 DIAGNOSIS — E669 Obesity, unspecified: Secondary | ICD-10-CM | POA: Diagnosis not present

## 2019-07-20 MED ORDER — LISINOPRIL 10 MG PO TABS: 10.0000 mg | ORAL_TABLET | Freq: Every day | ORAL | 0 refills | Status: DC

## 2019-07-20 NOTE — Patient Instructions (Signed)
Dear Ashley Torres,   It was good to see you! Thank you for taking your time to come in to be seen. Today, we discussed the following:   - cholesterol labs, liver labs - decreasing lisinopril to 10 mg to see if that is helpful for the dizziness - referral to surgery - referral to our dietitian  For all labs obtained today, I will send results via MyChart.  If anything is abnormal, we will call you for details on further management.  Please bring all of your medications to your next appointment.    Be well,   Genia Hotter, M.D   [facility] [phone]  *Sign up for MyChart for instant access to your health profile, labs, orders, upcoming appointments or to contact your provider with questions*  ===================================================================================

## 2019-07-20 NOTE — Progress Notes (Signed)
SUBJECTIVE:  CHIEF COMPLAINT / HPI:   Patient presents today with multiple complaints.  Patient is accompanied with an ASL interpreter.  Weight loss Patient reports that she has been trying to change her diet since November.  She has been more serious about it in the last couple of weeks and she has been feeling a little awkward with changing her diet.  She has done a lot of research online and switched her diet over to fruits, vegetables, healthy proteins and has been keeping red meat out.   Hernia Patient also complains of a ventral hernia that has been very annoying, especially during exercise.  She denies any abdominal pain, bloody diarrhea.  She does not have a specific general surgeon with whom she'd like to schedule.  Medication issue-lisinopril Patient reports that her lisinopril has been making her feel poorly.  She reports dizziness after taking this medication.  She does note that she was recently sick and during that time, she did not take her lisinopril.  When she started it again, she started to have the side effects and has noticed that is associated with this medication.  Blood type Patient would like to know what blood type she has.  Patient also has some other issues that we did not discuss today.  I encourage patient to make another follow-up appointment so that we can talk about these other issues and any new issues that she has.  She is agreeable.  PERTINENT  PMH / PSH: Hypertension, history of coarctation of aorta, status post AVR  Medication List was reviewed and updated.   OBJECTIVE:  BP 110/68   Pulse 80   Ht 4\' 10"  (1.473 m)   Wt 176 lb (79.8 kg)   LMP  (LMP Unknown)   BMI 36.78 kg/m   General: Well-appearing female, no acute distress.  Just recently changed her hair color to black. Abdomen: Positive bowel sounds.  Mild tenderness to deep palpation.  No guarding, no peritoneal signs.  On Valsalva, ventral hernia appreciated at midline approximately 12  cm long.  Hernia is reducible. Legs: No lower extremity edema.  ASSESSMENT/PLAN:  HTN (hypertension) Patient's blood pressure today is 110/68.  She has had with well-controlled blood pressures in the past.  Will have lisinopril at this time due to symptoms of dizziness.  If no resolution, can consider discontinuing lisinopril and starting losartan.  Ventral hernia without obstruction or gangrene Patient reports that her hernia has been very bothersome to her recently, especially with exercise.  She is requesting a (though does note that she did not like a previous surgeon that she wants saw).  We will send a referral for general surgery for hernia repair consult.  Obesity, Class II, BMI 35-39.9 Patient has had stable weight.  As noted previously by Dr. Development worker, international aid, can consider bariatric surgery (though did not discuss this with patient today).  At this time, patient is doing a lot of research on nutrition and foods that are good for her.  Patient is agreeable to see Dr. Artist Pais for further dietitian consult.  She would like to review portion sizes, balanced meals.  Ambulatory referral provided.  Handouts provided for nutrition.  Patient also has medical concerns as she has read about fatty liver and liver issues in the past.  She does believe that she has been told that she may have a liver issue.  No abnormalities noted previously.  Will obtain lipid panel as well as well as hepatic panel, given patient's  BMI, can monitor for NAFLD.  Patient also has some other issues that we did not discuss today.  I encourage patient to make another follow-up appointment so that we can talk about these other issues and any new issues that she has.  She is agreeable. (Discussed right abdomen issue and worsening throat pain at next appointment)  Will discuss labs with patient at her follow up visit given no emergent findings.   Wilber Oliphant, MD [place]

## 2019-07-20 NOTE — Assessment & Plan Note (Addendum)
Patient's blood pressure today is 110/68.  She has had with well-controlled blood pressures in the past.  Will have lisinopril at this time due to symptoms of dizziness.  If no resolution, can consider discontinuing lisinopril and starting losartan.

## 2019-07-21 ENCOUNTER — Encounter: Payer: Self-pay | Admitting: Family Medicine

## 2019-07-21 DIAGNOSIS — K439 Ventral hernia without obstruction or gangrene: Secondary | ICD-10-CM | POA: Insufficient documentation

## 2019-07-21 DIAGNOSIS — R7401 Elevation of levels of liver transaminase levels: Secondary | ICD-10-CM | POA: Insufficient documentation

## 2019-07-21 HISTORY — DX: Ventral hernia without obstruction or gangrene: K43.9

## 2019-07-21 LAB — LIPID PANEL
Chol/HDL Ratio: 3.8 ratio (ref 0.0–4.4)
Cholesterol, Total: 195 mg/dL (ref 100–199)
HDL: 51 mg/dL (ref >39)
LDL Chol Calc (NIH): 123 mg/dL — ABNORMAL HIGH (ref 0–99)
Triglycerides: 115 mg/dL (ref 0–149)
VLDL Cholesterol Cal: 21 mg/dL (ref 5–40)

## 2019-07-21 LAB — HEPATIC FUNCTION PANEL
ALT: 41 IU/L — ABNORMAL HIGH (ref 0–32)
AST: 33 IU/L (ref 0–40)
Albumin: 4.4 g/dL (ref 3.8–4.8)
Alkaline Phosphatase: 79 IU/L (ref 39–117)
Bilirubin Total: 0.4 mg/dL (ref 0.0–1.2)
Bilirubin, Direct: 0.12 mg/dL (ref 0.00–0.40)
Total Protein: 6.9 g/dL (ref 6.0–8.5)

## 2019-07-21 NOTE — Assessment & Plan Note (Signed)
Patient reports that her hernia has been very bothersome to her recently, especially with exercise.  She is requesting a Development worker, international aid (though does note that she did not like a previous surgeon that she wants saw).  We will send a referral for general surgery for hernia repair consult.

## 2019-07-21 NOTE — Assessment & Plan Note (Signed)
Patient's lipid profile is within normal limits with exception of LDL which is elevated

## 2019-07-21 NOTE — Assessment & Plan Note (Addendum)
Patient has had stable weight.  As noted previously by Dr. Artist Pais, can consider bariatric surgery (though did not discuss this with patient today).  At this time, patient is doing a lot of research on nutrition and foods that are good for her.  Patient is agreeable to see Dr. Gerilyn Pilgrim for further dietitian consult.  She would like to review portion sizes, balanced meals.  Ambulatory referral provided.  Handouts provided for nutrition.  Patient also has medical concerns as she has read about fatty liver and liver issues in the past.  She does believe that she has been told that she may have a liver issue.  No abnormalities noted previously.  Will obtain lipid panel as well as well as hepatic panel, given patient's BMI, can monitor for NAFLD. Elevated ALT

## 2019-07-21 NOTE — Assessment & Plan Note (Signed)
Obtain liver function panel appears that ALT is mildly elevated.  Had discussed possible NAFLD the with patient given her BMI.  Given the minor increase, will continue to monitor at this time.  Plans to recheck in 1 month.

## 2019-08-03 ENCOUNTER — Other Ambulatory Visit: Payer: Self-pay | Admitting: *Deleted

## 2019-08-03 DIAGNOSIS — J3089 Other allergic rhinitis: Secondary | ICD-10-CM

## 2019-08-03 MED ORDER — PROAIR RESPICLICK 108 (90 BASE) MCG/ACT IN AEPB: 1.0000 | INHALATION_SPRAY | RESPIRATORY_TRACT | 2 refills | Status: DC | PRN

## 2019-08-18 ENCOUNTER — Other Ambulatory Visit: Payer: Self-pay | Admitting: Surgery

## 2019-08-18 DIAGNOSIS — M6208 Separation of muscle (nontraumatic), other site: Secondary | ICD-10-CM | POA: Diagnosis not present

## 2019-09-22 ENCOUNTER — Telehealth: Payer: Self-pay

## 2019-09-22 NOTE — Telephone Encounter (Signed)
Patient calls nurse line regarding lisinopril dosage. Patient states that lisinopril dosage from pharmacy is still being dispensed as 20 mg. Called and spoke with pharmacist. Pharmacist states that they did not receive the order on 07/20/19. Gave verbal order for updated Lisinopril dosage, 10 mg by mouth, once daily.   Patient also wants PCP to be aware that she is waiting to hear back from 3 different general surgeons that are in her insurance network. Patient will call back with general surgeon for updated referral if needed.   FYI to PCP  Veronda Prude, RN

## 2019-09-28 ENCOUNTER — Telehealth: Payer: Self-pay | Admitting: *Deleted

## 2019-09-28 NOTE — Telephone Encounter (Signed)
Previous referral changed to reflect changes that patient is wanting.  Ashley Torres,CMA

## 2019-09-28 NOTE — Telephone Encounter (Signed)
Patient needs a referral to the general surgeon who is in her network.  She states that she has an appt with Dr. Marzetta Board @ [facility] and he is in network, just needs a referral sent.  To Jazmin (referral coordinator) to see if the already existing referral can be sent to [facility].  Jone Baseman, CMA

## 2019-10-12 NOTE — Telephone Encounter (Signed)
Called patient back and she is aware that referral has been sent to [facility].  She stated that there is nothing left for me to do.  Zienna Ahlin,CMA

## 2019-10-12 NOTE — Telephone Encounter (Signed)
Patient LVM on nurse line requesting a referral to be sent to [facility]. Per chart review, looks like this has been taking care of. I called patient back to inform. Patient reports she wants to see a "Dr.Pace." When asked for more information patient replied with, "I am not in the best mood today and I don't know where everything is." Patient then hung up. Forwarding to Jazmin to see if she is familiar with "Dr. Arita Miss."

## 2019-10-14 DIAGNOSIS — M6208 Separation of muscle (nontraumatic), other site: Secondary | ICD-10-CM | POA: Diagnosis not present

## 2019-10-15 ENCOUNTER — Other Ambulatory Visit: Payer: Self-pay | Admitting: Family Medicine

## 2019-10-15 DIAGNOSIS — I10 Essential (primary) hypertension: Secondary | ICD-10-CM

## 2019-10-18 ENCOUNTER — Telehealth: Payer: Self-pay | Admitting: Family Medicine

## 2019-10-18 ENCOUNTER — Other Ambulatory Visit: Payer: Self-pay | Admitting: Family Medicine

## 2019-10-18 DIAGNOSIS — I10 Essential (primary) hypertension: Secondary | ICD-10-CM

## 2019-10-18 NOTE — Telephone Encounter (Signed)
Patient is calling and would like to have her lisinopril and her ventolin inhaler refilled. Patient states that it has to be the Ventolin inhaler due to insurance.

## 2019-10-18 NOTE — Telephone Encounter (Signed)
Pt also requesting the refill of ventolin inhaler refilled. Pt said for insurance it has to be ventolin. Sunday Spillers, CMA

## 2019-10-20 ENCOUNTER — Other Ambulatory Visit: Payer: Self-pay | Admitting: Family Medicine

## 2019-10-20 MED ORDER — ALBUTEROL SULFATE HFA 108 (90 BASE) MCG/ACT IN AERS: 2.0000 | INHALATION_SPRAY | RESPIRATORY_TRACT | 3 refills | Status: DC | PRN

## 2019-10-28 ENCOUNTER — Encounter: Payer: Self-pay | Admitting: Plastic Surgery

## 2019-10-28 ENCOUNTER — Ambulatory Visit (INDEPENDENT_AMBULATORY_CARE_PROVIDER_SITE_OTHER): Payer: Medicare HMO | Admitting: Plastic Surgery

## 2019-10-28 ENCOUNTER — Other Ambulatory Visit: Payer: Self-pay

## 2019-10-28 VITALS — BP 127/76 | HR 89 | Temp 98.5°F | Ht <= 58 in | Wt 178.2 lb

## 2019-10-28 DIAGNOSIS — M6208 Separation of muscle (nontraumatic), other site: Secondary | ICD-10-CM | POA: Diagnosis not present

## 2019-10-28 NOTE — Progress Notes (Signed)
Referring Provider Melene Plan, MD [address] [place],  [place] [address]   CC:  Chief Complaint  Patient presents with  . Consult      Ashley Torres is an 64 y.o. female.  HPI: Patient presents as a referral for diastases recti.  She feels like she has had this for a long time.  She says that intermittently she will have some abdominal pain after eating certain foods and with certain movements but she does not feel like her abdomen really bothers her all that much.  Through reviewing the notes she is seeing a Development worker, international aid and another local plastic surgeon neither of which have recommended surgery for her.  She is interested in getting another opinion.  She has not had any previous abdominal operations.  She is congenitally deaf and so all the history was taken through an interpreter.  She does believe she has gained a bit of weight recently over the past year  Allergies  Allergen Reactions  . Statins     Upset stomach  . Atorvastatin Other (See Comments)    Stomach upset   . Erythromycin     Pt can not remember ? Maybe swelling and itching  . Furosemide   . Hydrocodone-Acetaminophen Itching and Nausea And Vomiting  . Meloxicam     Nausea, vomiting  . Metoprolol Nausea And Vomiting  . Montelukast     Increased head aches  . Omeprazole     Never took medication but read about "dangerous side effects"  . Potassium Chloride     Outpatient Encounter Medications as of 10/28/2019  Medication Sig  . acetaminophen (TYLENOL) 500 MG tablet Take 500 mg by mouth daily as needed for moderate pain.   Marland Kitchen albuterol (VENTOLIN HFA) 108 (90 Base) MCG/ACT inhaler Inhale 2 puffs into the lungs every 4 (four) hours as needed for wheezing or shortness of breath.  Marland Kitchen amoxicillin (AMOXIL) 500 MG tablet TAKE 4 TABLETS BY MOUTH 30-60 MINS PRIOR TO DENTAL PROCEDURES  . aspirin EC 81 MG tablet Take 81 mg by mouth daily.  . Calcium Carb-Cholecalciferol [phone] MG-UNIT CAPS Take 2 tablets by  mouth daily.  . calcium carbonate (TUMS - DOSED IN MG ELEMENTAL CALCIUM) 500 MG chewable tablet Chew 1 tablet by mouth 4 (four) times daily.  . chlorpheniramine (CHLOR-TRIMETON) 4 MG tablet Take 4 mg by mouth as needed for allergies.  . fexofenadine (ALLEGRA) 180 MG tablet Take 180 mg by mouth daily.  . fluticasone (FLONASE) 50 MCG/ACT nasal spray Place 2 sprays into both nostrils daily.  Marland Kitchen FOLIC ACID PO Take 1 tablet by mouth daily.  Marland Kitchen GLYCERIN-HYPROMELLOSE-PEG 400 OP Apply 1 drop to eye as needed (Dry eyes).  . hydrochlorothiazide (HYDRODIURIL) 25 MG tablet TAKE 1 TABLET(25 MG) BY MOUTH DAILY  . levothyroxine (SYNTHROID) 100 MCG tablet TAKE 1 TABLET BY MOUTH DAILY BEFORE BREAKFAST  . lisinopril (ZESTRIL) 10 MG tablet TAKE 1 TABLET BY MOUTH EVERY DAY  . meclizine (ANTIVERT) 25 MG tablet Take 1 tablet (25 mg total) by mouth 3 (three) times daily as needed for dizziness.  . Multiple Vitamins-Minerals (MULTIVITAMIN ADULTS 50+ PO) Take by mouth daily.  . Multiple Vitamins-Minerals (OCUVITE PRESERVISION PO) Take 1 tablet by mouth daily.  . Pseudoephedrine-APAP-DM (DAYQUIL PO) Take by mouth as needed (allergies).  . Red Yeast Rice 600 MG CAPS Take 1 capsule (600 mg total) by mouth daily. (Patient taking differently: Take 1 capsule by mouth 2 (two) times daily. )  . vitamin C (  ASCORBIC ACID) 250 MG tablet Take 250 mg by mouth daily.  . Vitamin E 100 units TABS Take 1 tablet by mouth daily.   No facility-administered encounter medications on file as of 10/28/2019.     Past Medical History:  Diagnosis Date  . Allergic rhinitis   . Anxiety   . Aortic stenosis   . Aphakia of left eye   . Aphakia of left eye 06/29/2014  . Bicuspid aortic valve   . Cervical prolapse 09/14/2014   Stage 3 cervical prolapse with urge incontinence per Gynecology. Referred to pelvic floor PT and bladder training  . Chronic back pain 2015  . Chronic edema   . Chronic sinusitis   . Deafness congenital   . Drusen of  right optic disc   . Environmental and seasonal allergies   . Gastric stress ulcer   . GERD (gastroesophageal reflux disease) 1990  . H/O aortic coarctation repair   . Hyperlipidemia 1997  . Hypertension 1997  . Hypothyroidism   . Nuclear sclerosis of right eye   . Obesity   . Rubella syndrome, congenital   . S/P AVR (aortic valve replacement)   . Scoliosis   . Uterine prolapse   . Vision impairment   . Vision loss, left eye     Past Surgical History:  Procedure Laterality Date  . AORTIC VALVE REPLACEMENT  2017  . breast biopsies     2001, 2002, & 2005   . BREAST EXCISIONAL BIOPSY Left   . CARDIAC CATHETERIZATION    . COARCTATION OF AORTA REPAIR     repaired at age 84  . 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 THYROID SURGERY  1997   benign mass    Family History  Problem Relation Age of Onset  . Other Mother        killed in gas explosion  . Asthma Mother   . Heart murmur Father   . Arthritis Father   . Hyperlipidemia Father   . GER disease Father   . Arthritis Brother   . Sinusitis Brother   . Diabetes Paternal Grandmother   . Rheumatic fever Paternal Grandmother   . Breast cancer Paternal Aunt   . Heart disease Neg Hx     Social History   Social History Narrative   Lives alone   Right handed   Caffeine: chocolate only, not often     Review of Systems General: Denies fevers, chills, weight loss CV: Denies chest pain, shortness of breath, palpitations  Physical Exam Vitals with BMI 10/28/2019 07/20/2019 07/07/2019  Height 4\' 9"  4\' 10"  4' 9.5"  Weight 178 lbs 3 oz 176 lbs 178 lbs  BMI 38.55 36.79 37.83  Systolic 127 110 -  Diastolic 76 68 -  Pulse 89 80 -    General:  No acute distress,  Alert and oriented, Non-Toxic, Normal speech and affect Abdomen: Abdomen is soft and nontender.  There is no obvious scars.  When she bears down there is a ventral bur bulge in the upper midline.  The skin around the umbilicus is thinning a  bit.  Assessment/Plan Patient presents clinically with rectus diastases.  In teasing out the symptoms that she is having it does not sound like this is very symptomatic for her.  I explained that surgery could be done to plicate the abdominal wall and try to bring the rectus muscles closer together however I actually doubt that this would make her feel any better.  I explained that with any surgical procedure there are risks and given the medical comorbidities that she does have she might be had a slightly increased risk for those complications.  At the moment it does not appear that she is ready to have this operated on but I had be happy to see her again to discuss it further with her she should change her mind.  All of her questions were answered I will plan to see her again on an as-needed basis.  Allena Napoleon 10/28/2019, 4:09 PM

## 2019-11-05 ENCOUNTER — Ambulatory Visit: Payer: Medicare HMO | Admitting: Family Medicine

## 2019-12-03 ENCOUNTER — Ambulatory Visit (INDEPENDENT_AMBULATORY_CARE_PROVIDER_SITE_OTHER): Payer: Medicare HMO | Admitting: Family Medicine

## 2019-12-03 ENCOUNTER — Encounter: Payer: Self-pay | Admitting: Family Medicine

## 2019-12-03 ENCOUNTER — Other Ambulatory Visit: Payer: Self-pay

## 2019-12-03 VITALS — BP 122/70 | HR 79 | Ht <= 58 in | Wt 175.0 lb

## 2019-12-03 DIAGNOSIS — I10 Essential (primary) hypertension: Secondary | ICD-10-CM

## 2019-12-03 DIAGNOSIS — L918 Other hypertrophic disorders of the skin: Secondary | ICD-10-CM

## 2019-12-03 DIAGNOSIS — E039 Hypothyroidism, unspecified: Secondary | ICD-10-CM

## 2019-12-03 MED ORDER — FAMOTIDINE 10 MG PO TABS: 10.0000 mg | ORAL_TABLET | Freq: Every day | ORAL | 3 refills | Status: DC

## 2019-12-03 MED ORDER — LISINOPRIL 10 MG PO TABS: 10.0000 mg | ORAL_TABLET | Freq: Every day | ORAL | 0 refills | Status: DC

## 2019-12-03 MED ORDER — MECLIZINE HCL 25 MG PO TABS: 25.0000 mg | ORAL_TABLET | Freq: Three times a day (TID) | ORAL | 3 refills | Status: DC | PRN

## 2019-12-03 MED ORDER — HYDROCHLOROTHIAZIDE 25 MG PO TABS: ORAL_TABLET | ORAL | 3 refills | Status: DC

## 2019-12-03 MED ORDER — LEVOTHYROXINE SODIUM 100 MCG PO TABS: ORAL_TABLET | ORAL | 3 refills | Status: DC

## 2019-12-08 ENCOUNTER — Encounter: Payer: Self-pay | Admitting: Family Medicine

## 2019-12-08 NOTE — Assessment & Plan Note (Signed)
Well controlled. On 10 mg daily.

## 2019-12-08 NOTE — Progress Notes (Signed)
    SUBJECTIVE:  CHIEF COMPLAINT / HPI:   Skin Tags  Patient would like multiple skin tags on chest and neck removed today.   PERTINENT  PMH / PSH: HTN, HLD, congenital deafness  OBJECTIVE:  BP 122/70   Pulse 79   Ht 4\' 9"  (1.448 m)   Wt 175 lb (79.4 kg)   LMP  (LMP Unknown)   SpO2 97%   BMI 37.87 kg/m   Well appearing female, NAD.  Multiple skin tags on bilateral neck and bilateral axilla.   ASSESSMENT/PLAN:  Skin Tag Removal Procedure Note PRE-OP DIAGNOSIS: Skin tag POST-OP DIAGNOSIS: Same  PROCEDURE: skin biopsy Performing Physician: Supervising Physician (if applicable): n/a    PROCEDURE:    The area surrounding the skin lesion was prepared and draped in the usual sterile manner. The lesion was removed in the usual manner by the 11 blade scalpel and pick ups. Hemostasis was with pressure and silver nitrate. The patient tolerated the procedure well. This was repeated for other skin tags. 3 on right lateral neck, 2 on left lateral neck, 3 on right axilla, 4 on left axilla. Sizes of skin tags 30mm-3mm.     Followup: The patient tolerated the procedure well without complications.  Standard post-procedure care is explained and return precautions are given.  HTN (hypertension) Well controlled. On 10 mg daily.   Hypothyroidism (acquired) Patient requesting refill for thyroid medication. Repeat TSH today.     0m, MD [facility]

## 2019-12-08 NOTE — Assessment & Plan Note (Signed)
Patient requesting refill for thyroid medication. Repeat TSH today.

## 2019-12-15 ENCOUNTER — Telehealth: Payer: Self-pay | Admitting: *Deleted

## 2019-12-15 NOTE — Telephone Encounter (Signed)
-----   Message from Melene Plan, MD sent at 12/08/2019  1:07 PM EDT ----- Please call patient and ask her to return for TSH and make lab appointment. Future TSH ordered.

## 2019-12-15 NOTE — Telephone Encounter (Signed)
Contacted pt and scheduled a lab visit to have this done.Ashley Torres, CMA

## 2019-12-21 NOTE — Progress Notes (Signed)
Cardiology Office Note    Date:  12/22/2019   ID:  Ashley Torres, DOB 01-02-1956, MRN [id number]  PCP:  Wilber Oliphant, MD  Cardiologist: Ena [place], MD EPS: None  No chief complaint on file.   History of Present Illness:  Ashley Torres is a 64 y.o. female with congenital deafness, bicuspid aortic valve status post bioprosthetic aortic valve replacement in 03/2015, coarctation of the aorta status post repair as a child, hypertension, hyperlipidemia, hypothyroidism, chronic lower extremity edema, chronic sinusitis, and GERD   Per Dr. Francesca Oman notes, she had coarctation repair around 64 years of age. In 2016 she was found to have severe aortic stenosis. Pre-op cath 02/2015 at [facility]) showed normal coronaries. She subsequently underwent aortic valve replacement with annulus enlargement (19 mm Magna model 300 Carpentier-Edwards bovine pericardial heart valve) on 04/17/15 at [facility]. Last echo 08/2017 showed moderate focal BSH of the septum, EF 60-65%, grade 1 DD, high VFP, /p AVR with mildly elevated mean gradient of 21 mmHg; no AI; trace TR with mild pulmonary hypertension; elevated velocity in descending aorta (2.7 m/s); suggested CTA or MRA to further assess coarctation. CT scan 09/2017 showed "some expected narrowing of the distal arch and proximal descending thoracic aorta at the level of prior coarctation repair. 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 This can be followed in the future by CTA or MRA. Elevated aortic velocity may be secondary to the underlying smoothly tapered narrowing in combination with significant tortuosity present. The lack of significant collateral arteries may argue against a hemodynamically significant recurrent stenosis at the level of prior repair." Dr. Meda Coffee felt there was some expected narrowing but not significant and  recommended continued surveillance. She was seen in the ED 02/2018 with acute on chronic dizziness with unrevealing workup. She declined MRI at that time. Meclizine resolved her symptoms. She was previously offered furosemide for her chronic edema but opted against this   Patient last had a telemedicine visit with Sharrell Ku, PA-C 07/07/2019 at which time she was stable from a cardiac standpoint.  Patient comes in accompanied by interpreter. Having a lot of allergy problems and using inhaler, flonase, and allegra. Walks some, looking to move apartments. Limited by hip problems. No chest pain, dyspnea, dizziness or presyncope. Has some swelling. Uses compression stockings in the winter. Keeps legs elevated at night. Limits salt.  Past Medical History:  Diagnosis Date  . Allergic rhinitis   . Anxiety   . Aortic stenosis   . Aphakia of left eye   . Aphakia of left eye 06/29/2014  . Bicuspid aortic valve   . Cervical prolapse 09/14/2014   Stage 3 cervical prolapse with urge incontinence per Gynecology. Referred to pelvic floor PT and bladder training  . Chronic back pain 2015  . Chronic edema   . Chronic sinusitis   . Deafness congenital   . Drusen of right optic disc   . Environmental and seasonal allergies   . Gastric stress ulcer   . GERD (gastroesophageal reflux disease) 1990  . H/O aortic coarctation repair   . Hyperlipidemia 1997  . Hypertension 1997  . Hypothyroidism   . Nuclear sclerosis of right eye   . Obesity   . Rubella syndrome, congenital   . S/P AVR (aortic valve replacement)   . Scoliosis   . Uterine prolapse   . Vision impairment   . Vision  loss, left eye     Past Surgical History:  Procedure Laterality Date  . AORTIC VALVE REPLACEMENT  2017  . breast biopsies     2001, 2002, & 2005   . BREAST EXCISIONAL BIOPSY Left   . CARDIAC CATHETERIZATION    . COARCTATION OF AORTA REPAIR     repaired at age 12  . 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  THYROID SURGERY  1997   benign mass    Current Medications: Current Meds  Medication Sig  . acetaminophen (TYLENOL) 500 MG tablet Take 500 mg by mouth daily as needed for moderate pain.   Marland Kitchen albuterol (VENTOLIN HFA) 108 (90 Base) MCG/ACT inhaler Inhale 2 puffs into the lungs every 4 (four) hours as needed for wheezing or shortness of breath.  Marland Kitchen amoxicillin (AMOXIL) 500 MG tablet TAKE 4 TABLETS BY MOUTH 30-60 MINS PRIOR TO DENTAL PROCEDURES  . aspirin EC 81 MG tablet Take 81 mg by mouth daily.  . Calcium Carb-Cholecalciferol [phone] MG-UNIT CAPS Take 2 tablets by mouth daily.  . calcium carbonate (TUMS - DOSED IN MG ELEMENTAL CALCIUM) 500 MG chewable tablet Chew 1 tablet by mouth 4 (four) times daily.  . chlorpheniramine (CHLOR-TRIMETON) 4 MG tablet Take 4 mg by mouth as needed for allergies.  . famotidine (PEPCID) 10 MG tablet Take 1 tablet (10 mg total) by mouth daily.  . fexofenadine (ALLEGRA) 180 MG tablet Take 180 mg by mouth daily.  . fluticasone (FLONASE) 50 MCG/ACT nasal spray Place 2 sprays into both nostrils daily.  Marland Kitchen FOLIC ACID PO Take 1 tablet by mouth daily.  Marland Kitchen GLYCERIN-HYPROMELLOSE-PEG 400 OP Apply 1 drop to eye as needed (Dry eyes).  Marland Kitchen levothyroxine (SYNTHROID) 100 MCG tablet TAKE 1 TABLET BY MOUTH DAILY BEFORE BREAKFAST  . lisinopril (ZESTRIL) 10 MG tablet Take 1 tablet (10 mg total) by mouth daily.  . meclizine (ANTIVERT) 25 MG tablet Take 1 tablet (25 mg total) by mouth 3 (three) times daily as needed for dizziness.  . Multiple Vitamins-Minerals (MULTIVITAMIN ADULTS 50+ PO) Take by mouth daily.  . Multiple Vitamins-Minerals (OCUVITE PRESERVISION PO) Take 1 tablet by mouth daily.  . Pseudoephedrine-APAP-DM (DAYQUIL PO) Take by mouth as needed (allergies).  . Red Yeast Rice 600 MG CAPS Take 1 capsule (600 mg total) by mouth daily. (Patient taking differently: Take 1 capsule by mouth 2 (two) times daily. )  . vitamin C (ASCORBIC ACID) 250 MG tablet Take 250 mg by mouth daily.   . Vitamin E 100 units TABS Take 1 tablet by mouth daily.  . [DISCONTINUED] hydrochlorothiazide (HYDRODIURIL) 25 MG tablet TAKE 1 TABLET(25 MG) BY MOUTH DAILY     Allergies:   Statins, Atorvastatin, Erythromycin, Furosemide, Hydrocodone-acetaminophen, Meloxicam, Metoprolol, Montelukast, Omeprazole, and Potassium chloride   Social History   Socioeconomic History  . Marital status: Single    Spouse name: Not on file  . Number of children: Not on file  . Years of education: some college  . Highest education level: Not on file  Occupational History    Comment: unemployed  Tobacco Use  . Smoking status: Never Smoker  . Smokeless tobacco: Never Used  Vaping Use  . Vaping Use: Never used  Substance and Sexual Activity  . Alcohol use: Never  . Drug use: Never  . Sexual activity: Never  Other Topics Concern  . Not on file  Social History Narrative   Lives alone   Right handed   Caffeine: chocolate only,  not often   Social Determinants of Radio broadcast assistant Strain:   . Difficulty of Paying Living Expenses: Not on file  Food Insecurity:   . Worried About Charity fundraiser in the Last Year: Not on file  . Ran Out of Food in the Last Year: Not on file  Transportation Needs:   . Lack of Transportation (Medical): Not on file  . Lack of Transportation (Non-Medical): Not on file  Physical Activity:   . Days of Exercise per Week: Not on file  . Minutes of Exercise per Session: Not on file  Stress:   . Feeling of Stress : Not on file  Social Connections:   . Frequency of Communication with Friends and Family: Not on file  . Frequency of Social Gatherings with Friends and Family: Not on file  . Attends Religious Services: Not on file  . Active Member of Clubs or Organizations: Not on file  . Attends Archivist Meetings: Not on file  . Marital Status: Not on file     Family History:  The patient's family history includes Arthritis in her brother and father;  Asthma in her mother; Breast cancer in her paternal aunt; Diabetes in her paternal grandmother; GER disease in her father; Heart murmur in her father; Hyperlipidemia in her father; Other in her mother; Rheumatic fever in her paternal grandmother; Sinusitis in her brother.   ROS:   Please see the history of present illness.    ROS All other systems reviewed and are negative.   PHYSICAL EXAM:   VS:  BP (!) 146/84   Pulse 95   Ht 4' 9"  (1.448 m)   Wt 174 lb 3.2 oz (79 kg)   LMP  (LMP Unknown)   SpO2 93%   BMI 37.70 kg/m   Physical Exam  GEN: Obese, in no acute distress  Neck: no JVD, carotid bruits, or masses RWERXVQ:MGQ;6-7/6 systolic murmur LSB Respiratory:  clear to auscultation bilaterally, normal work of breathing GI: soft, nontender, nondistended, + BS Ext: plus 3 edema Neuro:  Alert and Oriented x 3 Psych: euthymic mood, full affect  Wt Readings from Last 3 Encounters:  12/22/19 174 lb 3.2 oz (79 kg)  12/03/19 175 lb (79.4 kg)  10/28/19 178 lb 3.2 oz (80.8 kg)      Studies/Labs Reviewed:   EKG:  EKG is not ordered today-patient declined Recent Labs: 06/28/2019: BUN 19; Creatinine, Ser 0.80; Potassium 3.9; Sodium 145 07/20/2019: ALT 41   Lipid Panel    Component Value Date/Time   CHOL 195 07/20/2019 1522   TRIG 115 07/20/2019 1522   HDL 51 07/20/2019 1522   CHOLHDL 3.8 07/20/2019 1522   LDLCALC 123 (H) 07/20/2019 1522   LDLDIRECT 135 (H) 12/13/2016 1440    Additional studies/ records that were reviewed today include:  Prior CV studies:   The following studies were reviewed today:   2D Echo 08/2017 - Left ventricle: The cavity size was normal. There was moderate    focal basal hypertrophy of the septum. Systolic function was    normal. The estimated ejection fraction was in the range of 60%    to 65%. Wall motion was normal; there were no regional wall    motion abnormalities. Doppler parameters are consistent with    abnormal left ventricular relaxation  (grade 1 diastolic    dysfunction). Doppler parameters are consistent with high    ventricular filling pressure.  - Aortic valve: A bioprosthesis was present.  -  Mitral valve: Calcified annulus.  - Pulmonary arteries: Systolic pressure was mildly increased. PA    peak pressure: 44 mm Hg (S).   Impressions:   - Normal LV function; mild diastolic dysfunction; s/p AVR with    mildly elevated mean gradient of 21 mmHg; no AI; trace TR with    mild pulmonary hypertension; elevated velocity in descending    aorta (2.7 m/s); suggest CTA or MRA to further assess    coarctation.    CTA 06/2019 IMPRESSION: 1. Stable postsurgical changes of the distal aortic arch compatible patient's previous aortic coarctation repair. Stable narrowing at the repair site of approximately 11-12 mm. Stable small pseudoaneurysm extending superiorly from the region of the repair site. Recommend continued follow-up CTA chest as clinically indicated.   2. Mild cardiomegaly and evidence of previous aortic valve replacement.   3. 7 mm upper pole left renal calcification unchanged. Hepatic steatosis.   4.  Stable severe curvature of the thoracic spine convex right.            ASSESSMENT:    1. S/P AVR (aortic valve replacement)   2. History of aortic coarctation repair   3. Essential hypertension   4. Hyperlipidemia, unspecified hyperlipidemia type   5. Chronic edema      PLAN:  In order of problems listed above:   Status post AVR for severe AS 2017 last echo was in 2019 and she did have a gradient.  Has a murmur but is asymptomatic.  We will update 2D echo with follow-up with Dr. Meda Coffee on the same day.  Patient has transportation issues and usually has to take [place] to get here.  I offered social services to help with this but she declined as her brother gave her some money to help her out.  SBE prophylaxis  History coarctation repair stable CT 06/2019 see above  Essential hypertension BP  controlled  Hyperlipidemia LDL 123 07/2019 does not want to take statin.  Chronic lower extremity edema watches sodium closely.  I offered to start her on Lasix and stop HCTZ but patient did not want to come back for a be met because of transportation issues and wants to stay on her current meds.  Medication Adjustments/Labs and Tests Ordered: Current medicines are reviewed at length with the patient today.  Concerns regarding medicines are outlined above.  Medication changes, Labs and Tests ordered today are listed in the Patient Instructions below. There are no Patient Instructions on file for this visit.   Signed, Ermalinda Barrios, PA-C  12/22/2019 1:39 PM    [facility] [place], [place], [place]  [address] Phone: [phone]; Fax: [phone]

## 2019-12-22 ENCOUNTER — Encounter: Payer: Self-pay | Admitting: Physician Assistant

## 2019-12-22 ENCOUNTER — Other Ambulatory Visit: Payer: Medicare HMO

## 2019-12-22 ENCOUNTER — Other Ambulatory Visit: Payer: Self-pay

## 2019-12-22 ENCOUNTER — Ambulatory Visit (INDEPENDENT_AMBULATORY_CARE_PROVIDER_SITE_OTHER): Payer: Medicare HMO | Admitting: Physician Assistant

## 2019-12-22 VITALS — BP 146/84 | HR 95 | Ht <= 58 in | Wt 174.2 lb

## 2019-12-22 DIAGNOSIS — E785 Hyperlipidemia, unspecified: Secondary | ICD-10-CM | POA: Diagnosis not present

## 2019-12-22 DIAGNOSIS — I10 Essential (primary) hypertension: Secondary | ICD-10-CM

## 2019-12-22 DIAGNOSIS — Z8774 Personal history of (corrected) congenital malformations of heart and circulatory system: Secondary | ICD-10-CM

## 2019-12-22 DIAGNOSIS — R609 Edema, unspecified: Secondary | ICD-10-CM

## 2019-12-22 DIAGNOSIS — Z952 Presence of prosthetic heart valve: Secondary | ICD-10-CM | POA: Diagnosis not present

## 2019-12-22 DIAGNOSIS — E039 Hypothyroidism, unspecified: Secondary | ICD-10-CM

## 2019-12-22 MED ORDER — AMOXICILLIN 500 MG PO TABS: ORAL_TABLET | ORAL | 2 refills | Status: DC

## 2019-12-22 MED ORDER — HYDROCHLOROTHIAZIDE 25 MG PO TABS: ORAL_TABLET | ORAL | 3 refills | Status: DC

## 2019-12-22 NOTE — Patient Instructions (Signed)
Medication Instructions:  Your physician recommends that you continue on your current medications as directed. Please refer to the Current Medication list given to you today.   *If you need a refill on your cardiac medications before your next appointment, please call your pharmacy*   Lab Work: None today  If you have labs (blood work) drawn today and your tests are completely normal, you will receive your results only by:  MyChart Message (if you have MyChart) OR  A paper copy in the mail If you have any lab test that is abnormal or we need to change your treatment, we will call you to review the results.   Testing/Procedures: Your physician has requested that you have an echocardiogram. Echocardiography is a painless test that uses sound waves to create images of your heart. It provides your doctor with information about the size and shape of your heart and how well your hearts chambers and valves are working. This procedure takes approximately one hour. There are no restrictions for this procedure.     Follow-Up: At [facility], you and your health needs are our priority.  As part of our continuing mission to provide you with exceptional heart care, we have created designated Provider Care Teams.  These Care Teams include your primary Cardiologist (physician) and Advanced Practice Providers (APPs -  Physician Assistants and Nurse Practitioners) who all work together to provide you with the care you need, when you need it.  We recommend signing up for the patient portal called "MyChart".  Sign up information is provided on this After Visit Summary.  MyChart is used to connect with patients for Virtual Visits (Telemedicine).  Patients are able to view lab/test results, encounter notes, upcoming appointments, etc.  Non-urgent messages can be sent to your provider as well.   To learn more about what you can do with MyChart, go to [email or web address].    Your next appointment:    January or February 2022 - Same day as Echocardiogram  The format for your next appointment:   In Person  Provider:   Tobias Alexander, MD

## 2019-12-23 LAB — TSH: TSH: 1.32 u[IU]/mL (ref 0.450–4.500)

## 2020-01-11 ENCOUNTER — Ambulatory Visit (INDEPENDENT_AMBULATORY_CARE_PROVIDER_SITE_OTHER): Payer: Medicare HMO

## 2020-01-11 ENCOUNTER — Other Ambulatory Visit: Payer: Self-pay

## 2020-01-11 DIAGNOSIS — Z23 Encounter for immunization: Secondary | ICD-10-CM | POA: Diagnosis not present

## 2020-01-11 NOTE — Progress Notes (Signed)
Patient presents in [facility] for Flu Vaccine.   Vaccine administered RD without complication.   See admin for details.   Patient reports she cut her pinky toe too short and feels it is infected. Patient reports pain, however no swelling, fever, chills, or drainage. I looked at her toe and did not see any abnormalities. Patient given antibiotic ointment to apply as needed and to call for an apt if no improvement.

## 2020-01-15 ENCOUNTER — Other Ambulatory Visit: Payer: Self-pay | Admitting: Family Medicine

## 2020-01-15 DIAGNOSIS — E039 Hypothyroidism, unspecified: Secondary | ICD-10-CM

## 2020-02-21 ENCOUNTER — Other Ambulatory Visit: Payer: Self-pay | Admitting: Pediatrics

## 2020-02-21 DIAGNOSIS — Z1231 Encounter for screening mammogram for malignant neoplasm of breast: Secondary | ICD-10-CM

## 2020-03-13 ENCOUNTER — Ambulatory Visit: Payer: Medicare HMO | Admitting: Family Medicine

## 2020-03-13 NOTE — Progress Notes (Deleted)
    SUBJECTIVE:  ? ?CHIEF COMPLAINT / HPI:  ? ?*** ? ?PERTINENT  PMH / PSH: *** ? ?OBJECTIVE:  ? ?LMP  (LMP Unknown)   ?*** ? ?ASSESSMENT/PLAN:  ? ?No problem-specific Assessment & Plan notes found for this encounter. ?  ? ? ?Mackay Hanauer Autry-Lott, DO ?[facility]  ?

## 2020-03-22 ENCOUNTER — Other Ambulatory Visit: Payer: Self-pay | Admitting: Family Medicine

## 2020-03-22 DIAGNOSIS — I10 Essential (primary) hypertension: Secondary | ICD-10-CM

## 2020-04-04 ENCOUNTER — Ambulatory Visit: Payer: Medicare HMO

## 2020-04-05 ENCOUNTER — Other Ambulatory Visit: Payer: Self-pay

## 2020-04-05 ENCOUNTER — Ambulatory Visit (INDEPENDENT_AMBULATORY_CARE_PROVIDER_SITE_OTHER): Payer: Medicare HMO | Admitting: Family Medicine

## 2020-04-05 VITALS — BP 140/66 | HR 90 | Temp 98.6°F | Ht <= 58 in

## 2020-04-05 DIAGNOSIS — J019 Acute sinusitis, unspecified: Secondary | ICD-10-CM

## 2020-04-05 DIAGNOSIS — B9689 Other specified bacterial agents as the cause of diseases classified elsewhere: Secondary | ICD-10-CM | POA: Diagnosis not present

## 2020-04-05 MED ORDER — AMOXICILLIN 875 MG PO TABS: 875.0000 mg | ORAL_TABLET | Freq: Two times a day (BID) | ORAL | 0 refills | Status: AC

## 2020-04-05 NOTE — Patient Instructions (Signed)
Thank you for coming to see me today. It was a pleasure. Today we talked about:   We will prescribe amoxicillin for a sinus infection.  If you don't improve over the next week, please come back.   If you have any questions or concerns, please do not hesitate to call the office at [phone].  Best,   Luis Abed, DO

## 2020-04-05 NOTE — Assessment & Plan Note (Signed)
Discussed with patient that her symptoms could be consistent with COVID-19, however given that they have been present for 5 to 7 days, and test supplies limited, would not recommend testing her as she would be out of isolation window and would not require further treatment at that time.  Her symptoms are mild, but given she continues to have congestion that is not improving and tenderness of her sinuses we will go ahead and treat her for bacterial sinusitis.  Prescribed amoxicillin 875 mg twice daily for 5 days.  Advised her to return to care if she has no improvement.  She has worsening in her symptoms or shortness of breath she should come back sooner.

## 2020-04-05 NOTE — Progress Notes (Signed)
    SUBJECTIVE:   CHIEF COMPLAINT / HPI:   Congestion, sinus pressure Started 5-7 days ago Coughing up yellow mucus Feels congested, has pain and pressure in her sinuses No fevers No shortness of breath Has an inhaler at home, not needing it more for this Has been eating and drinking normally She feels like she isn't improving She has not been tested for COVID She has not had any sick contact She does not smoke   ASL interpretor 100091  PERTINENT  PMH / PSH: HTN, Hypothyroidism, congenital deafness, Hx aortic valve replacement, obesity, HLD  OBJECTIVE:   BP 140/66   Pulse 90   Temp 98.6 F (37 C) (Oral)   Ht 4\' 9"  (1.448 m)   LMP  (LMP Unknown)   SpO2 96%   BMI 37.70 kg/m    Physical Exam:  General: 65 y.o. female in NAD HEENT: b/l nares with clear rhinorrhea, throat clear, TTP frontal and maxillary sinuses Neck: supple, no cervical LAD Cardio: RRR, mechanical valve Lungs: CTAB, no wheezing, no rhonchi, no crackles, no IWOB on RA Skin: warm and dry Extremities: No edema, ambulating with cane   ASSESSMENT/PLAN:   Acute bacterial rhinosinusitis Discussed with patient that her symptoms could be consistent with COVID-19, however given that they have been present for 5 to 7 days, and test supplies limited, would not recommend testing her as she would be out of isolation window and would not require further treatment at that time.  Her symptoms are mild, but given she continues to have congestion that is not improving and tenderness of her sinuses we will go ahead and treat her for bacterial sinusitis.  Prescribed amoxicillin 875 mg twice daily for 5 days.  Advised her to return to care if she has no improvement.  She has worsening in her symptoms or shortness of breath she should come back sooner.     70, DO [facility]

## 2020-04-25 NOTE — Progress Notes (Unsigned)
Cardiology Office Note    Date:  04/26/2020   ID:  Ashley Torres, DOB 1955/11/11, MRN [id number]  PCP:  Melene Plan, MD  Cardiologist: Tobias Alexander, MD EPS: None  Chief Complaint  Patient presents with  . Follow-up    History of Present Illness:  Ashley Torres is a 65 y.o. female  with congenital deafness, bicuspid aortic valve status post bioprosthetic aortic valve replacement in 03/2015, coarctation of the aorta status post repair as a child, hypertension, hyperlipidemia, hypothyroidism, chronic lower extremity edema, chronic sinusitis, and GERD    Per Dr. Lindaann Slough notes, she had coarctation repair around 65 years of age. In 2016 she was found to have severe aortic stenosis. Pre-op cath 02/2015 at [facility]) showed normal coronaries. She subsequently underwent aortic valve replacement with annulus enlargement (19 mm Magna model 300 Carpentier-Edwards bovine pericardial heart valve) on 04/17/15 at [facility]. Last echo 08/2017 showed moderate focal BSH of the septum, EF 60-65%, grade 1 DD, high VFP, /p AVR with mildly elevated mean gradient of 21 mmHg; no AI; trace TR with mild pulmonary hypertension; elevated velocity in descending aorta (2.7 m/s); suggested CTA or MRA to further assess coarctation. CT scan 09/2017 showed "some expected narrowing of the distal arch and proximal descending thoracic aorta at the level of prior coarctation repair. 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 This can be followed in the future by CTA or MRA. Elevated aortic velocity may be secondary to the underlying smoothly tapered narrowing in combination with significant tortuosity present. The lack of significant collateral arteries may argue against a hemodynamically significant recurrent stenosis at the level of prior repair." Dr. Delton See felt there was some expected narrowing  but not significant and recommended continued surveillance. She was seen in the ED 02/2018 with acute on chronic dizziness with unrevealing workup. She declined MRI at that time. Meclizine resolved her symptoms. She was previously offered furosemide for her chronic edema but opted against this    I saw the patient 12/22/19 and was stable.  Patient comes in for f/u with same day echo b/c of transportation issues. There is a sign language interpretor with her. Overall the patient is doing well. Denies chest pain, shortness of breath, dizziness. Hasn't exercised for 2 weeks in Jan because she was sick and doesn't live in a safe area to walk. Waiting to go back to the gym when safe with covid19. She may be moving to [employer] soon if it works out.    Past Medical History:  Diagnosis Date  . Allergic rhinitis   . Anxiety   . Aortic stenosis   . Aphakia of left eye   . Aphakia of left eye 06/29/2014  . Bicuspid aortic valve   . Cervical prolapse 09/14/2014   Stage 3 cervical prolapse with urge incontinence per Gynecology. Referred to pelvic floor PT and bladder training  . Chronic back pain 2015  . Chronic edema   . Chronic sinusitis   . Deafness congenital   . Drusen of right optic disc   . Environmental and seasonal allergies   . Gastric stress ulcer   . GERD (gastroesophageal reflux disease) 1990  . H/O aortic coarctation repair   . Hyperlipidemia 1997  . Hypertension 1997  . Hypothyroidism   . Nuclear sclerosis of right eye   . Obesity   . Rubella syndrome, congenital   .  S/P AVR (aortic valve replacement)   . Scoliosis   . Uterine prolapse   . Vision impairment   . Vision loss, left eye     Past Surgical History:  Procedure Laterality Date  . AORTIC VALVE REPLACEMENT  2017  . breast biopsies     2001, 2002, & 2005   . BREAST EXCISIONAL BIOPSY Left   . CARDIAC CATHETERIZATION    . COARCTATION OF AORTA REPAIR     repaired at age 11  . 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 THYROID SURGERY  1997   benign mass    Current Medications: Current Meds  Medication Sig  . acetaminophen (TYLENOL) 500 MG tablet Take 500 mg by mouth daily as needed for moderate pain.   Marland Kitchen albuterol (VENTOLIN HFA) 108 (90 Base) MCG/ACT inhaler Inhale 2 puffs into the lungs every 4 (four) hours as needed for wheezing or shortness of breath.  Marland Kitchen amoxicillin (AMOXIL) 500 MG tablet TAKE 4 TABLETS BY MOUTH 30-60 MINS PRIOR TO DENTAL PROCEDURES  . aspirin EC 81 MG tablet Take 81 mg by mouth daily.  . Calcium Carb-Cholecalciferol [phone] MG-UNIT CAPS Take 2 tablets by mouth daily.  . calcium carbonate (TUMS - DOSED IN MG ELEMENTAL CALCIUM) 500 MG chewable tablet Chew 1 tablet by mouth 4 (four) times daily.  . chlorpheniramine (CHLOR-TRIMETON) 4 MG tablet Take 4 mg by mouth as needed for allergies.  . famotidine (PEPCID) 10 MG tablet Take 1 tablet (10 mg total) by mouth daily.  . fexofenadine (ALLEGRA) 180 MG tablet Take 180 mg by mouth daily.  . fluticasone (FLONASE) 50 MCG/ACT nasal spray Place 2 sprays into both nostrils daily.  Marland Kitchen FOLIC ACID PO Take 1 tablet by mouth daily.  Marland Kitchen GLYCERIN-HYPROMELLOSE-PEG 400 OP Apply 1 drop to eye as needed (Dry eyes).  . hydrochlorothiazide (HYDRODIURIL) 25 MG tablet TAKE 1 TABLET(25 MG) BY MOUTH DAILY  . levothyroxine (SYNTHROID) 100 MCG tablet TAKE 1 TABLET BY MOUTH DAILY BEFORE BREAKFAST  . lisinopril (ZESTRIL) 10 MG tablet TAKE 1 TABLET BY MOUTH EVERY DAY  . meclizine (ANTIVERT) 25 MG tablet Take 1 tablet (25 mg total) by mouth 3 (three) times daily as needed for dizziness.  . Multiple Vitamins-Minerals (MULTIVITAMIN ADULTS 50+ PO) Take by mouth daily.  . Multiple Vitamins-Minerals (OCUVITE PRESERVISION PO) Take 1 tablet by mouth daily.  . Pseudoephedrine-APAP-DM (DAYQUIL PO) Take by mouth as needed (allergies).  . Red Yeast Rice 600 MG CAPS Take 1 capsule (600 mg total) by mouth daily. (Patient taking differently: Take 1 capsule by mouth  2 (two) times daily.)  . vitamin C (ASCORBIC ACID) 250 MG tablet Take 250 mg by mouth daily.  . Vitamin E 100 units TABS Take 1 tablet by mouth daily.     Allergies:   Statins, Atorvastatin, Erythromycin, Furosemide, Hydrocodone-acetaminophen, Meloxicam, Metoprolol, Montelukast, Omeprazole, and Potassium chloride   Social History   Socioeconomic History  . Marital status: Single    Spouse name: Not on file  . Number of children: Not on file  . Years of education: some college  . Highest education level: Not on file  Occupational History    Comment: unemployed  Tobacco Use  . Smoking status: Never Smoker  . Smokeless tobacco: Never Used  Vaping Use  . Vaping Use: Never used  Substance and Sexual Activity  . Alcohol use: Never  . Drug use: Never  . Sexual activity: Never  Other Topics Concern  . Not  on file  Social History Narrative   Lives alone   Right handed   Caffeine: chocolate only, not often   Social Determinants of Corporate investment banker Strain: Not on file  Food Insecurity: Not on file  Transportation Needs: Not on file  Physical Activity: Not on file  Stress: Not on file  Social Connections: Not on file     Family History:  The patient's   family history includes Arthritis in her brother and father; Asthma in her mother; Breast cancer in her paternal aunt; Diabetes in her paternal grandmother; GER disease in her father; Heart murmur in her father; Hyperlipidemia in her father; Other in her mother; Rheumatic fever in her paternal grandmother; Sinusitis in her brother.   ROS:   Please see the history of present illness.    ROS All other systems reviewed and are negative.   PHYSICAL EXAM:   VS:  BP (!) 142/86   Pulse 81   Ht 4\' 9"  (1.448 m)   LMP  (LMP Unknown)   SpO2 97%   BMI 37.70 kg/m   Physical Exam  GEN: Well nourished, well developed, in no acute distress  Neck: no JVD, carotid bruits, or masses Cardiac:RRR;3/6 systolic murmur  LSB Respiratory:  clear to auscultation bilaterally, normal work of breathing GI: soft, nontender, nondistended, + BS Ext: Plus 2 edema otherwise without cyanosis, clubbing Good distal pulses bilaterally Neuro:  Alert and Oriented x 3,   Psych: euthymic mood, full affect  Wt Readings from Last 3 Encounters:  12/22/19 174 lb 3.2 oz (79 kg)  12/03/19 175 lb (79.4 kg)  10/28/19 178 lb 3.2 oz (80.8 kg)      Studies/Labs Reviewed:   EKG:  EKG is ordered today.  The ekg ordered today demonstrates NSR with LVH unchanged  Recent Labs: 06/28/2019: BUN 19; Creatinine, Ser 0.80; Potassium 3.9; Sodium 145 07/20/2019: ALT 41 12/22/2019: TSH 1.320   Lipid Panel    Component Value Date/Time   CHOL 195 07/20/2019 1522   TRIG 115 07/20/2019 1522   HDL 51 07/20/2019 1522   CHOLHDL 3.8 07/20/2019 1522   LDLCALC 123 (H) 07/20/2019 1522   LDLDIRECT 135 (H) 12/13/2016 1440    Additional studies/ records that were reviewed today include:     2D Echo 08/2017 - Left ventricle: The cavity size was normal. There was moderate    focal basal hypertrophy of the septum. Systolic function was    normal. The estimated ejection fraction was in the range of 60%    to 65%. Wall motion was normal; there were no regional wall    motion abnormalities. Doppler parameters are consistent with    abnormal left ventricular relaxation (grade 1 diastolic    dysfunction). Doppler parameters are consistent with high    ventricular filling pressure.  - Aortic valve: A bioprosthesis was present.  - Mitral valve: Calcified annulus.  - Pulmonary arteries: Systolic pressure was mildly increased. PA    peak pressure: 44 mm Hg (S).   Impressions:   - Normal LV function; mild diastolic dysfunction; s/p AVR with    mildly elevated mean gradient of 21 mmHg; no AI; trace TR with    mild pulmonary hypertension; elevated velocity in descending    aorta (2.7 m/s); suggest CTA or MRA to further assess    coarctation.    CTA  06/2019 IMPRESSION: 1. Stable postsurgical changes of the distal aortic arch compatible patient's previous aortic coarctation repair. Stable narrowing at the repair site  of approximately 11-12 mm. Stable small pseudoaneurysm extending superiorly from the region of the repair site. Recommend continued follow-up CTA chest as clinically indicated.   2. Mild cardiomegaly and evidence of previous aortic valve replacement.   3. 7 mm upper pole left renal calcification unchanged. Hepatic steatosis.   4.  Stable severe curvature of the thoracic spine convex right.                    Risk Assessment/Calculations:         ASSESSMENT:    1. Bicuspid aortic valve   2. H/O coarctation of aorta   3. Acute on chronic combined systolic and diastolic CHF (congestive heart failure) (HCC)   4. Essential hypertension   5. Other hyperlipidemia   6. Chronic edema      PLAN:  In order of problems listed above:  Status post AVR for severe AS 2017 last echo was in 2019 and she did have a gradient.  Has a murmur but is asymptomatic. Preliminary echo today shows similar mean/peak gradients and normal LV function-will await final reading.  SBE prophylaxis   History coarctation repair stable CT 06/2019 see above   Essential hypertension BP fairly well controlled   Hyperlipidemia LDL 123 07/2019 does not want to take statin.   Chronic lower extremity edema watches sodium closely. Continue HCTZ for now.Will check labs today.     Shared Decision Making/Informed Consent        Medication Adjustments/Labs and Tests Ordered: Current medicines are reviewed at length with the patient today.  Concerns regarding medicines are outlined above.  Medication changes, Labs and Tests ordered today are listed in the Patient Instructions below. Patient Instructions  Medication Instructions:  Your physician recommends that you continue on your current medications as directed. Please refer to the Current  Medication list given to you today.  *If you need a refill on your cardiac medications before your next appointment, please call your pharmacy*   Lab Work: FLP, CBC, CMET Today  If you have labs (blood work) drawn today and your tests are completely normal, you will receive your results only by: Marland Kitchen MyChart Message (if you have MyChart) OR . A paper copy in the mail If you have any lab test that is abnormal or we need to change your treatment, we will call you to review the results.   Follow-Up: At [facility], you and your health needs are our priority.  As part of our continuing mission to provide you with exceptional heart care, we have created designated Provider Care Teams.  These Care Teams include your primary Cardiologist (physician) and Advanced Practice Providers (APPs -  Physician Assistants and Nurse Practitioners) who all work together to provide you with the care you need, when you need it.  We recommend signing up for the patient portal called "MyChart".  Sign up information is provided on this After Visit Summary.  MyChart is used to connect with patients for Virtual Visits (Telemedicine).  Patients are able to view lab/test results, encounter notes, upcoming appointments, etc.  Non-urgent messages can be sent to your provider as well.   To learn more about what you can do with MyChart, go to [email or web address].    Your next appointment:   1 year(s)  The format for your next appointment:   In Person  Provider:   Laurance Flatten, MD      Signed, Jacolyn Reedy, PA-C  04/26/2020 1:23 PM    [facility]  [address], [place], [place]  [address] Phone: [phone]; Fax: [phone]

## 2020-04-26 ENCOUNTER — Ambulatory Visit (HOSPITAL_COMMUNITY): Payer: Medicare HMO | Attending: Cardiovascular Disease

## 2020-04-26 ENCOUNTER — Other Ambulatory Visit: Payer: Self-pay

## 2020-04-26 ENCOUNTER — Ambulatory Visit (INDEPENDENT_AMBULATORY_CARE_PROVIDER_SITE_OTHER): Payer: Medicare HMO | Admitting: Physician Assistant

## 2020-04-26 ENCOUNTER — Encounter: Payer: Self-pay | Admitting: Physician Assistant

## 2020-04-26 VITALS — BP 142/86 | HR 81 | Ht <= 58 in

## 2020-04-26 DIAGNOSIS — Q231 Congenital insufficiency of aortic valve: Secondary | ICD-10-CM | POA: Diagnosis not present

## 2020-04-26 DIAGNOSIS — E7849 Other hyperlipidemia: Secondary | ICD-10-CM

## 2020-04-26 DIAGNOSIS — R609 Edema, unspecified: Secondary | ICD-10-CM | POA: Diagnosis not present

## 2020-04-26 DIAGNOSIS — E785 Hyperlipidemia, unspecified: Secondary | ICD-10-CM | POA: Insufficient documentation

## 2020-04-26 DIAGNOSIS — Z954 Presence of other heart-valve replacement: Secondary | ICD-10-CM

## 2020-04-26 DIAGNOSIS — I5043 Acute on chronic combined systolic (congestive) and diastolic (congestive) heart failure: Secondary | ICD-10-CM | POA: Diagnosis not present

## 2020-04-26 DIAGNOSIS — Z952 Presence of prosthetic heart valve: Secondary | ICD-10-CM | POA: Diagnosis not present

## 2020-04-26 DIAGNOSIS — I359 Nonrheumatic aortic valve disorder, unspecified: Secondary | ICD-10-CM | POA: Diagnosis not present

## 2020-04-26 DIAGNOSIS — I10 Essential (primary) hypertension: Secondary | ICD-10-CM | POA: Diagnosis not present

## 2020-04-26 DIAGNOSIS — I517 Cardiomegaly: Secondary | ICD-10-CM | POA: Insufficient documentation

## 2020-04-26 DIAGNOSIS — Z8774 Personal history of (corrected) congenital malformations of heart and circulatory system: Secondary | ICD-10-CM | POA: Diagnosis not present

## 2020-04-26 DIAGNOSIS — E039 Hypothyroidism, unspecified: Secondary | ICD-10-CM | POA: Insufficient documentation

## 2020-04-26 LAB — ECHOCARDIOGRAM COMPLETE
AV Mean grad: 22.8 mmHg
AV Peak grad: 35.8 mmHg
Ao pk vel: 2.99 m/s
Area-P 1/2: 3.48 cm2
S' Lateral: 2.3 cm

## 2020-04-26 NOTE — Patient Instructions (Signed)
Medication Instructions:  Your physician recommends that you continue on your current medications as directed. Please refer to the Current Medication list given to you today.  *If you need a refill on your cardiac medications before your next appointment, please call your pharmacy*   Lab Work: FLP, CBC, CMET Today  If you have labs (blood work) drawn today and your tests are completely normal, you will receive your results only by: Marland Kitchen MyChart Message (if you have MyChart) OR . A paper copy in the mail If you have any lab test that is abnormal or we need to change your treatment, we will call you to review the results.   Follow-Up: At [facility], you and your health needs are our priority.  As part of our continuing mission to provide you with exceptional heart care, we have created designated Provider Care Teams.  These Care Teams include your primary Cardiologist (physician) and Advanced Practice Providers (APPs -  Physician Assistants and Nurse Practitioners) who all work together to provide you with the care you need, when you need it.  We recommend signing up for the patient portal called "MyChart".  Sign up information is provided on this After Visit Summary.  MyChart is used to connect with patients for Virtual Visits (Telemedicine).  Patients are able to view lab/test results, encounter notes, upcoming appointments, etc.  Non-urgent messages can be sent to your provider as well.   To learn more about what you can do with MyChart, go to [email or web address].    Your next appointment:   1 year(s)  The format for your next appointment:   In Person  Provider:   Laurance Flatten, MD

## 2020-04-27 LAB — COMPREHENSIVE METABOLIC PANEL
ALT: 39 IU/L — ABNORMAL HIGH (ref 0–32)
AST: 28 IU/L (ref 0–40)
Albumin/Globulin Ratio: 1.7 (ref 1.2–2.2)
Albumin: 4.9 g/dL — ABNORMAL HIGH (ref 3.8–4.8)
Alkaline Phosphatase: 83 IU/L (ref 44–121)
BUN/Creatinine Ratio: 26 (ref 12–28)
BUN: 20 mg/dL (ref 8–27)
Bilirubin Total: 0.4 mg/dL (ref 0.0–1.2)
CO2: 25 mmol/L (ref 20–29)
Calcium: 10.2 mg/dL (ref 8.7–10.3)
Chloride: 98 mmol/L (ref 96–106)
Creatinine, Ser: 0.77 mg/dL (ref 0.57–1.00)
GFR calc Af Amer: 96 mL/min/{1.73_m2} (ref >59)
GFR calc non Af Amer: 83 mL/min/{1.73_m2} (ref >59)
Globulin, Total: 2.9 g/dL (ref 1.5–4.5)
Glucose: 86 mg/dL (ref 65–99)
Potassium: 4.2 mmol/L (ref 3.5–5.2)
Sodium: 144 mmol/L (ref 134–144)
Total Protein: 7.8 g/dL (ref 6.0–8.5)

## 2020-04-27 LAB — CBC
Hematocrit: 43.1 % (ref 34.0–46.6)
Hemoglobin: 14.6 g/dL (ref 11.1–15.9)
MCH: 29.4 pg (ref 26.6–33.0)
MCHC: 33.9 g/dL (ref 31.5–35.7)
MCV: 87 fL (ref 79–97)
Platelets: 226 10*3/uL (ref 150–450)
RBC: 4.97 x10E6/uL (ref 3.77–5.28)
RDW: 13.4 % (ref 11.7–15.4)
WBC: 6 10*3/uL (ref 3.4–10.8)

## 2020-04-27 LAB — LIPID PANEL
Chol/HDL Ratio: 4.2 ratio (ref 0.0–4.4)
Cholesterol, Total: 225 mg/dL — ABNORMAL HIGH (ref 100–199)
HDL: 54 mg/dL (ref >39)
LDL Chol Calc (NIH): 142 mg/dL — ABNORMAL HIGH (ref 0–99)
Triglycerides: 164 mg/dL — ABNORMAL HIGH (ref 0–149)
VLDL Cholesterol Cal: 29 mg/dL (ref 5–40)

## 2020-04-28 NOTE — Addendum Note (Signed)
Addended by: Adele Schilder on: 04/28/2020 07:28 AM   Modules accepted: Orders

## 2020-05-01 ENCOUNTER — Other Ambulatory Visit: Payer: Self-pay

## 2020-05-01 MED ORDER — ATORVASTATIN CALCIUM 10 MG PO TABS: 10.0000 mg | ORAL_TABLET | Freq: Every day | ORAL | 3 refills | Status: DC

## 2020-05-08 ENCOUNTER — Telehealth: Payer: Self-pay | Admitting: Cardiology

## 2020-05-08 DIAGNOSIS — E7849 Other hyperlipidemia: Secondary | ICD-10-CM

## 2020-05-08 NOTE — Telephone Encounter (Signed)
Pt c/o medication issue:  1. Name of Medication: atorvastatin (LIPITOR) 10 MG tablet  2. How are you currently taking this medication (dosage and times per day)? n/a  3. Are you having a reaction (difficulty breathing--STAT)? allergic  4. What is your medication issue? Patient states she will not take this medication, she states she is allergic to statins. She would like to speak with April.

## 2020-05-08 NOTE — Telephone Encounter (Signed)
Called patient about medications. Patient stated she received the letter from April. Patient stated she cannot tolerate atorvastatin. Patient stated she wants to try diet and exercise, but she needs some guidance. Informed patient about our office lipid clinic.  Patient stated she could give them a try. Made patient an appointment with lipid clinic to see if they can help. Will print off information about mediterranean diet to mail to patient. Will send message to April, so she is aware.

## 2020-05-09 NOTE — Telephone Encounter (Signed)
I have discontinued Atorvastatin from her med list. Will fwd to Jacolyn Reedy, PA so she is aware of this change.

## 2020-05-12 ENCOUNTER — Ambulatory Visit (INDEPENDENT_AMBULATORY_CARE_PROVIDER_SITE_OTHER): Payer: Medicare HMO | Admitting: Pharmacist

## 2020-05-12 ENCOUNTER — Other Ambulatory Visit: Payer: Self-pay

## 2020-05-12 DIAGNOSIS — E785 Hyperlipidemia, unspecified: Secondary | ICD-10-CM

## 2020-05-12 MED ORDER — ROSUVASTATIN CALCIUM 10 MG PO TABS: 10.0000 mg | ORAL_TABLET | Freq: Every day | ORAL | 3 refills | Status: DC

## 2020-05-12 NOTE — Progress Notes (Signed)
Patient ID: Ashley Torres                 DOB: 01-Jul-1955                    MRN: [id number]     HPI: Ashley Torres is a 65 y.o. female patient of Dr Shari Prows referred to lipid clinic by Jacolyn Reedy, PA. PMH is significant for congenital deafness, bicuspid aortic valve s/p bioprosthetic AVR in 03/2015, coarctation of the aorta s/p repair as a child, HTN, HLD, hypothyroidism, chronic LEE, chronic sinusitis, and GERD.  Pt presents today in good spirits with an interpreter, Consuella Lose. Pt reports only trying atorvastatin in the past which made her GERD worse. She has not taken other cholesterol medication in the past. Does take over the counter red yeast rice and inquires about starting over the counter fish oil as well.   Current Medications: red yeast rice 600mg  BID Intolerances: atorvastatin 10mg  and 40mg  - upset stomach Risk Factors: 10 year ASCVD risk is 7.3% LDL goal: 100mg /dL  Diet: Mornings - 2 slices of toast with low fat peanut butter and a little bit of jelly, fruit cup. Also likes eggs. Skips lunch - busy. Dinner - ground , occasionally has roast beef, loves broccoli, sometimes corn, rice and beans, grilled cheese sandwiches. Avoids fried food, bakes or grills meat. Likes water, crystal light, apple juice, and Gatorade zero sugar to drink.  Exercise: Weights at home, gyms closed with COVID. Has a bad back.   Family History: Arthritis in her brother and father; Asthma in her mother; Breast cancer in her paternal aunt; Diabetes in her paternal grandmother; GER disease in her father; Heart murmur in her father; Hyperlipidemia in her father; Other in her mother; Rheumatic fever in her paternal grandmother; Sinusitis in her brother.   Social History: Denies tobacco, alcohol, and illicit drug use.  Labs: 04/26/20: TC 225, TG 164, HDL 54, LDL 142 (red yeast rice 600mg  BID)  Past Medical History:  Diagnosis Date  . Allergic rhinitis   . Anxiety   . Aortic stenosis   . Aphakia of  left eye   . Aphakia of left eye 06/29/2014  . Bicuspid aortic valve   . Cervical prolapse 09/14/2014   Stage 3 cervical prolapse with urge incontinence per Gynecology. Referred to pelvic floor PT and bladder training  . Chronic back pain 2015  . Chronic edema   . Chronic sinusitis   . Deafness congenital   . Drusen of right optic disc   . Environmental and seasonal allergies   . Gastric stress ulcer   . GERD (gastroesophageal reflux disease) 1990  . H/O aortic coarctation repair   . Hyperlipidemia 1997  . Hypertension 1997  . Hypothyroidism   . Nuclear sclerosis of right eye   . Obesity   . Rubella syndrome, congenital   . S/P AVR (aortic valve replacement)   . Scoliosis   . Uterine prolapse   . Vision impairment   . Vision loss, left eye     Current Outpatient Medications on File Prior to Visit  Medication Sig Dispense Refill  . acetaminophen (TYLENOL) 500 MG tablet Take 500 mg by mouth daily as needed for moderate pain.     06/24/20 albuterol (VENTOLIN HFA) 108 (90 Base) MCG/ACT inhaler Inhale 2 puffs into the lungs every 4 (four) hours as needed for wheezing or shortness of breath. 6.7 g 3  . amoxicillin (AMOXIL) 500 MG tablet TAKE 4 TABLETS BY MOUTH 30-60  MINS PRIOR TO DENTAL PROCEDURES 4 tablet 2  . aspirin EC 81 MG tablet Take 81 mg by mouth daily.    . Calcium Carb-Cholecalciferol [phone] MG-UNIT CAPS Take 2 tablets by mouth daily.    . calcium carbonate (TUMS - DOSED IN MG ELEMENTAL CALCIUM) 500 MG chewable tablet Chew 1 tablet by mouth 4 (four) times daily.    . chlorpheniramine (CHLOR-TRIMETON) 4 MG tablet Take 4 mg by mouth as needed for allergies.    . famotidine (PEPCID) 10 MG tablet Take 1 tablet (10 mg total) by mouth daily. 90 tablet 3  . fexofenadine (ALLEGRA) 180 MG tablet Take 180 mg by mouth daily.    . fluticasone (FLONASE) 50 MCG/ACT nasal spray Place 2 sprays into both nostrils daily.    Marland Kitchen FOLIC ACID PO Take 1 tablet by mouth daily.    Marland Kitchen  GLYCERIN-HYPROMELLOSE-PEG 400 OP Apply 1 drop to eye as needed (Dry eyes).    . hydrochlorothiazide (HYDRODIURIL) 25 MG tablet TAKE 1 TABLET(25 MG) BY MOUTH DAILY 90 tablet 3  . levothyroxine (SYNTHROID) 100 MCG tablet TAKE 1 TABLET BY MOUTH DAILY BEFORE BREAKFAST 90 tablet 3  . lisinopril (ZESTRIL) 10 MG tablet TAKE 1 TABLET BY MOUTH EVERY DAY 90 tablet 0  . meclizine (ANTIVERT) 25 MG tablet Take 1 tablet (25 mg total) by mouth 3 (three) times daily as needed for dizziness. 30 tablet 3  . Multiple Vitamins-Minerals (MULTIVITAMIN ADULTS 50+ PO) Take by mouth daily.    . Multiple Vitamins-Minerals (OCUVITE PRESERVISION PO) Take 1 tablet by mouth daily.    . Pseudoephedrine-APAP-DM (DAYQUIL PO) Take by mouth as needed (allergies).    . Red Yeast Rice 600 MG CAPS Take 1 capsule (600 mg total) by mouth daily. (Patient taking differently: Take 1 capsule by mouth 2 (two) times daily.)    . vitamin C (ASCORBIC ACID) 250 MG tablet Take 250 mg by mouth daily.    . Vitamin E 100 units TABS Take 1 tablet by mouth daily.     No current facility-administered medications on file prior to visit.    Allergies  Allergen Reactions  . Statins     Upset stomach  . Atorvastatin Other (See Comments)    Stomach upset   . Erythromycin     Pt can not remember ? Maybe swelling and itching  . Furosemide   . Hydrocodone-Acetaminophen Itching and Nausea And Vomiting  . Meloxicam     Nausea, vomiting  . Metoprolol Nausea And Vomiting  . Montelukast     Increased head aches  . Omeprazole     Never took medication but read about "dangerous side effects"  . Potassium Chloride     Assessment/Plan:  1. Hyperlipidemia - Baseline LDL is 142 above goal < 100 for primary prevention. Pt has tried atorvastatin in the past which caused stomach upset. She is hesitant to try another statin but is agreeable to try rosuvastatin 10mg  daily. Advised pt she can stop taking red yeast rice. Did counsel pt on Repatha demo pen  today in case she does not tolerate rosuvastatin, discussed that insurance will not cover this unless she has tried at least 2 statins. Would avoid Zetia given her prior GI side effects with atorvastatin. Will recheck fasting labs in 3 months.   Devontay Celaya E. Orvell Careaga, PharmD, BCACP, CPP [facility] [address], [place], [place] Phone: [phone]; Fax: [phone] 05/12/2020 2:12 PM

## 2020-05-12 NOTE — Patient Instructions (Addendum)
It was nice to meet you today!  Your LDL cholesterol is 142 and your goal is less than 100  Start taking rosuvastatin (this is generic for Crestor) 10mg  tablets - 1 tablet once a day. You can take it with food. This will lower your LDL cholesterol by up to 50%.  We will recheck fasting lab work in 3 months on Friday May 20th around 8:30am to see how well your cholesterol drops.   If you do not tolerate the Crestor, let May 22 know. We can try you on Repatha injections that are given once every 2 weeks in the fatty tissue of your stomach or upper outer thigh and lower your LDL cholesterol by up to 60%

## 2020-05-16 ENCOUNTER — Other Ambulatory Visit: Payer: Self-pay

## 2020-05-16 ENCOUNTER — Ambulatory Visit
Admission: RE | Admit: 2020-05-16 | Discharge: 2020-05-16 | Disposition: A | Discharge status: Home / Self Care | Payer: Medicare HMO | Source: Ambulatory Visit | Attending: Pediatrics | Admitting: Pediatrics

## 2020-05-16 DIAGNOSIS — Z1231 Encounter for screening mammogram for malignant neoplasm of breast: Secondary | ICD-10-CM

## 2020-05-16 IMAGING — MG MM DIGITAL SCREENING BILAT W/ TOMO AND CAD
8 series · 8 of 24 positions shown · non-contrast
Comparison: Previous exam(s).

CLINICAL DATA: Screening.

EXAM:
DIGITAL SCREENING BILATERAL MAMMOGRAM WITH TOMOSYNTHESIS AND CAD
TECHNIQUE: Bilateral screening digital craniocaudal and mediolateral oblique
mammograms were obtained. Bilateral screening digital breast
tomosynthesis was performed. The images were evaluated with
computer-aided detection.

[R MLO synth-2D]
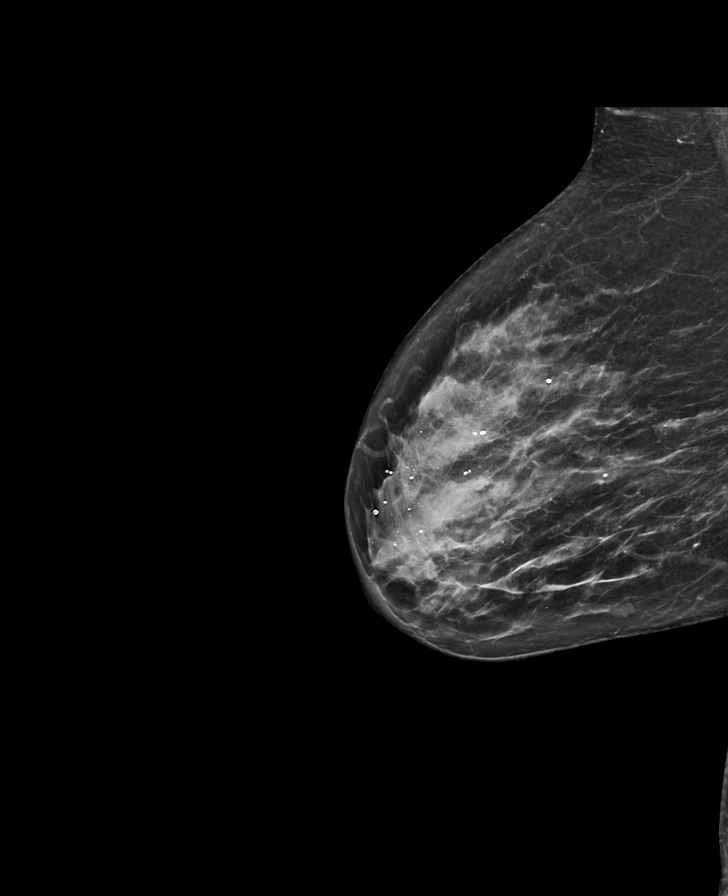

[R CC synth-2D]
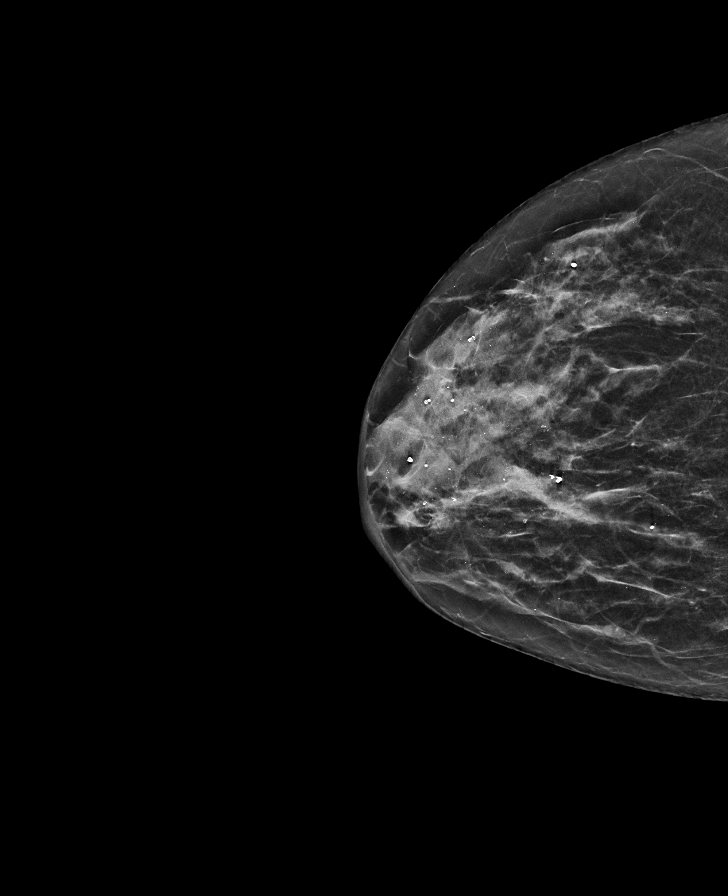

[L MLO synth-2D]
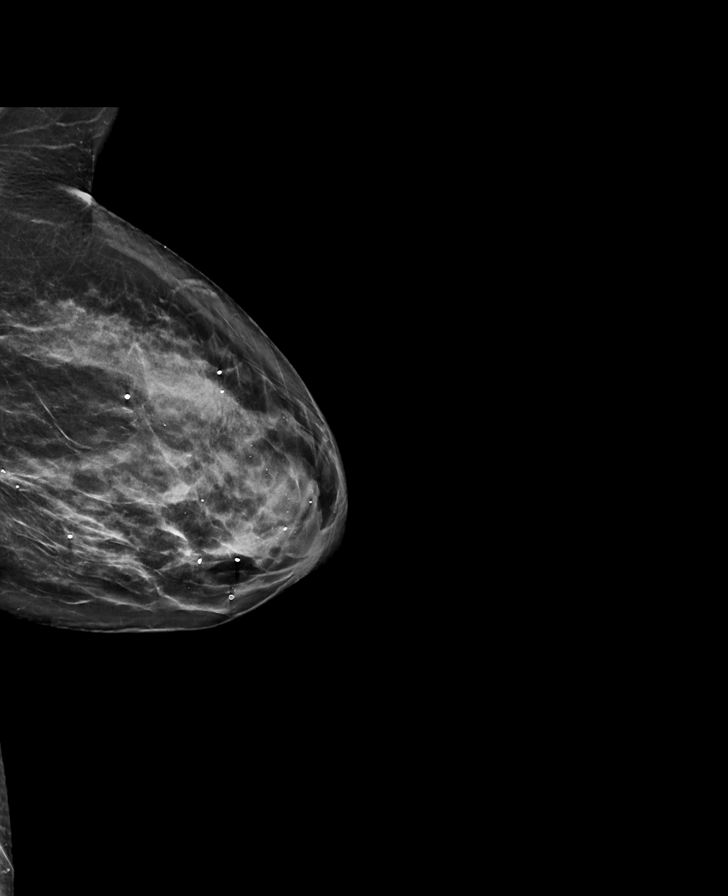

[L CC synth-2D]
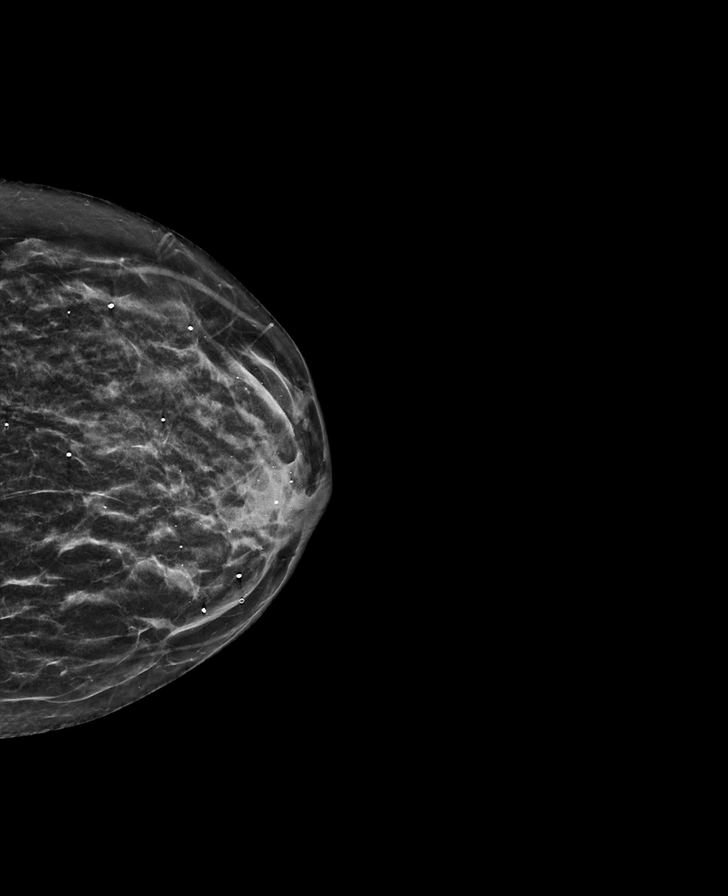

[R MLO tomo · tomo slice 33/65.0]
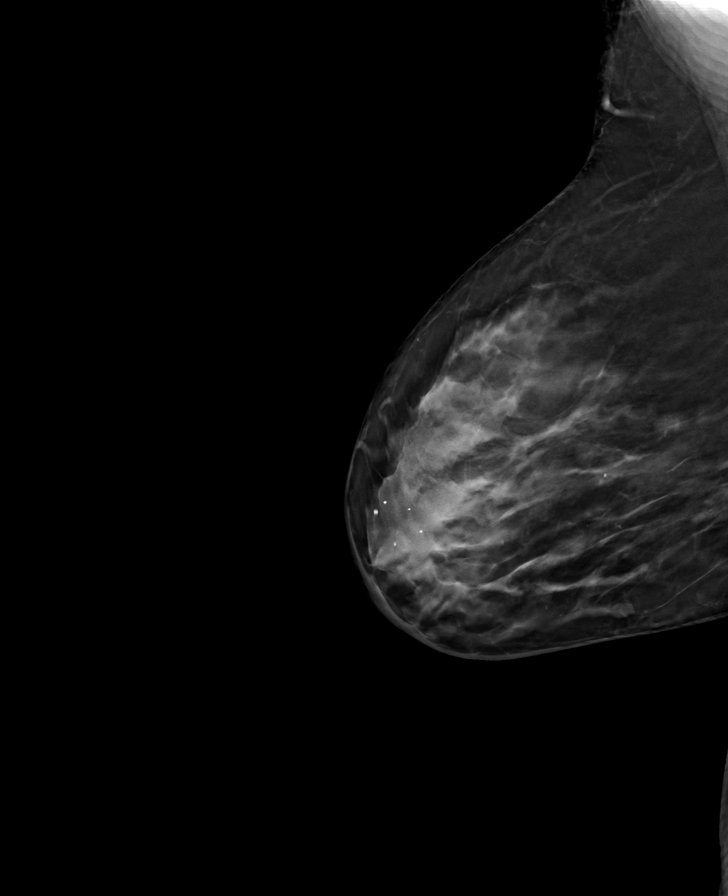

[L CC tomo · tomo slice 21/42.0]
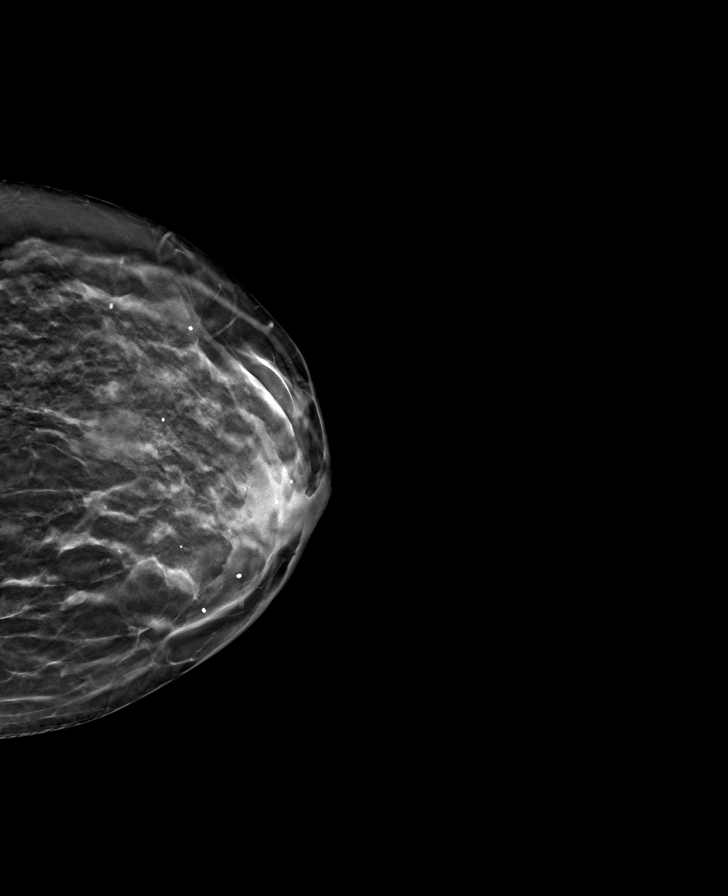

[L MLO tomo · tomo slice 34/67.0]
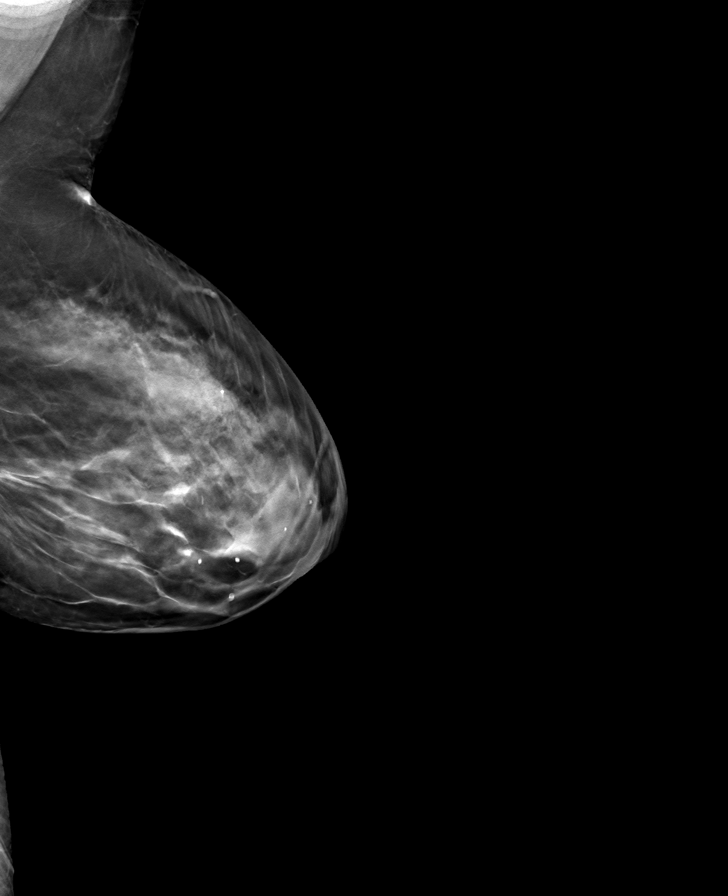

[R CC tomo · tomo slice 25/49.0]
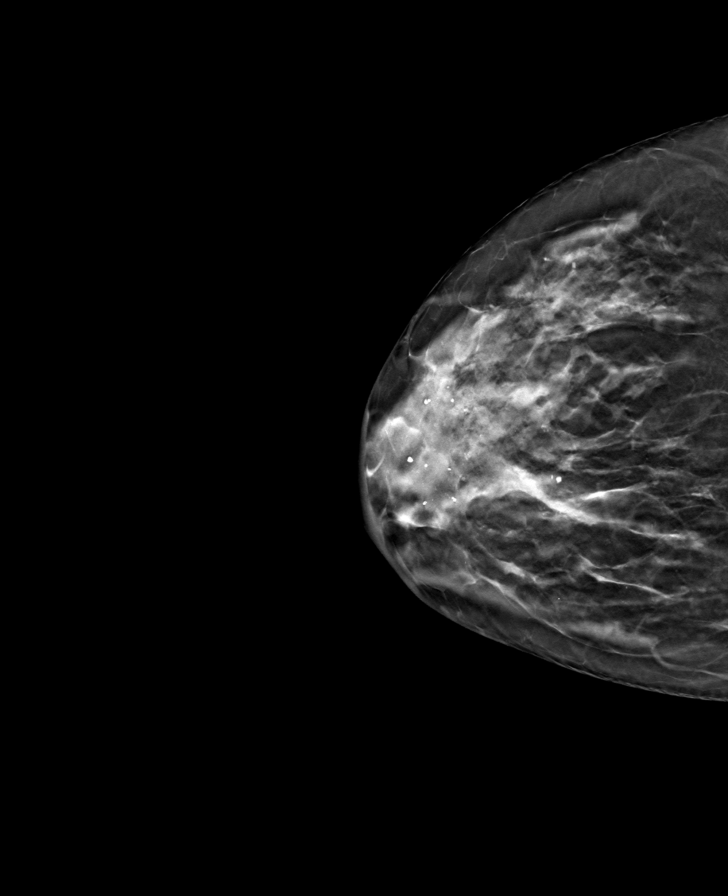

[8 of 24 positions shown; findings below may reference images not displayed]

ACR Breast Density Category c: The breast tissue is heterogeneously
dense, which may obscure small masses.
FINDINGS: There are no findings suspicious for malignancy. The images were
evaluated with computer-aided detection.
IMPRESSION: No mammographic evidence of malignancy. A result letter of this
screening mammogram will be mailed directly to the patient.

RECOMMENDATION:
Screening mammogram in one year. (Code:[6J])

BI-RADS CATEGORY  1: Negative.

## 2020-05-23 ENCOUNTER — Telehealth: Payer: Self-pay | Admitting: Cardiology

## 2020-05-23 MED ORDER — REPATHA SURECLICK 140 MG/ML ~~LOC~~ SOAJ: 1.0000 "pen " | SUBCUTANEOUS | 3 refills | Status: DC

## 2020-05-23 NOTE — Telephone Encounter (Signed)
Pt c/o medication issue:  1. Name of Medication: Crestor 10mg  tablets  2. How are you currently taking this medication (dosage and times per day)? Stopped taking yesterday   3. Are you having a reaction (difficulty breathing--STAT)? Yes   4. What is your medication issue? Ashley Torres is calling stating this medication caused pain on her left side when she took it, so she stopped taking it as of yesterday. She states since stopping the pain has not returned. She is requesting to speak with to discuss being prescribed an alternative medication. Please advise.

## 2020-05-23 NOTE — Telephone Encounter (Signed)
Routed this message to Pharmacist pool and [facility] Pharm[facility], as pt requested, to follow-up on med question.

## 2020-05-23 NOTE — Telephone Encounter (Addendum)
Pt previously intolerant to atorvastatin 10mg  and 40mg  daily (upset stomach), now intolerant to rosuvastatin 10mg  daily (myalgias). Avoiding ezetimibe due to GI issues at baseline. Baseline LDL is 142 above goal < 100 for primary prevention with 10 year ASCVD risk of 7.3%.  Called pt, she states on day 2-3 she noticed muscle aches/cramps in her abdomen that progressed during the first week. She skipped her dose yesterday and the pain went away. Pt wishes to try Repatha 140mg  Q2W. Prior authorization submitted and approved through 11/19/20 since pt has now tried 2 statins. Pt has Medicare and Medicaid so 3 month copay will likely be < $9. Pt is in agreement and will call with concerns. F/u labs scheduled in May to assess efficacy.

## 2020-05-29 ENCOUNTER — Telehealth: Payer: Self-pay | Admitting: Pharmacist

## 2020-05-29 NOTE — Telephone Encounter (Signed)
Pt called clinic inquiring about getting the shingles vaccine this month, pneumonia vaccine next month, and taking her Repatha. Advised there's no interaction between her vaccines and Repatha injections. She verbalized understanding.

## 2020-06-07 DIAGNOSIS — H5201 Hypermetropia, right eye: Secondary | ICD-10-CM | POA: Diagnosis not present

## 2020-07-03 ENCOUNTER — Other Ambulatory Visit: Payer: Self-pay | Admitting: Family Medicine

## 2020-07-03 ENCOUNTER — Telehealth: Payer: Self-pay | Admitting: Pharmacist

## 2020-07-03 DIAGNOSIS — I10 Essential (primary) hypertension: Secondary | ICD-10-CM

## 2020-07-03 NOTE — Telephone Encounter (Signed)
Pt called clinic asking about Repatha refills. I sent in a 3 month supply at her last visit with a year's worth of refills. Called pt - she confirmed she did get 3 boxes of Repatha (2 pens in each) which is a 3 month supply. She asks about any necessary follow up. Advised her just the lab work that has already been scheduled for 5/20. She verbalized understanding and had no further questions.

## 2020-07-11 ENCOUNTER — Telehealth: Payer: Self-pay | Admitting: Cardiology

## 2020-07-11 NOTE — Telephone Encounter (Signed)
Pt called in via sign language interpreter to inform our office and (former [facility] See pt)  Dr. Shari Prows that she received the 2nd covid booster, and did well with this.   She is just wanting to clarify that she doesn't need extra cardiac testing to be done, to follow-up on receiving 2nd booster.   She said she was advised by her PCP to have the boosters done when available, which she did, and she is doing well, with no side effects. Informed the pt that she does not need any other cardiac test to be done at this time, due to receiving 2nd booster vaccine.  We recently did an echo on her in Feb 2022, and this looked good, per Herma Carson PA-C.   Advised the pt to follow-up as planned with Dr. Shari Prows in 6 months. Advised her that if she were to develop symptoms in the interim, and thinks she is having cardiac related issues due to the booster received, then she should call our office and let us know, so we can further advise.   Pt verbalized understanding and agrees with this plan, via sign language interpreter.  Pt was appreciative for all the assistance provided, via sign language interpreter.  Will send this message to Dr. Shari Prows as a general FYI only.

## 2020-07-11 NOTE — Telephone Encounter (Signed)
Ashley Torres is calling with a sign language interpreter stating she received her 2nd booster on 07/05/20. She is calling concerned due to someone advising her the 2nd booster causes heart inflammation. She is wanting to know if she should come in to be seen and run some test to insure this is not occurring. She reports no symptoms. Please advise.

## 2020-08-01 ENCOUNTER — Other Ambulatory Visit: Payer: Medicare HMO

## 2020-08-01 ENCOUNTER — Telehealth: Payer: Self-pay | Admitting: Physician Assistant

## 2020-08-04 ENCOUNTER — Other Ambulatory Visit: Payer: Self-pay

## 2020-08-04 ENCOUNTER — Other Ambulatory Visit: Payer: Medicare HMO | Admitting: *Deleted

## 2020-08-04 DIAGNOSIS — E785 Hyperlipidemia, unspecified: Secondary | ICD-10-CM | POA: Diagnosis not present

## 2020-08-05 LAB — HEPATIC FUNCTION PANEL
ALT: 26 IU/L (ref 0–32)
AST: 24 IU/L (ref 0–40)
Albumin: 4.5 g/dL (ref 3.8–4.8)
Alkaline Phosphatase: 73 IU/L (ref 44–121)
Bilirubin Total: 0.3 mg/dL (ref 0.0–1.2)
Bilirubin, Direct: <0.1 mg/dL (ref 0.00–0.40)
Total Protein: 7.4 g/dL (ref 6.0–8.5)

## 2020-08-05 LAB — LIPID PANEL
Chol/HDL Ratio: 2.9 ratio (ref 0.0–4.4)
Cholesterol, Total: 149 mg/dL (ref 100–199)
HDL: 51 mg/dL (ref >39)
LDL Chol Calc (NIH): 74 mg/dL (ref 0–99)
Triglycerides: 138 mg/dL (ref 0–149)
VLDL Cholesterol Cal: 24 mg/dL (ref 5–40)

## 2020-08-07 ENCOUNTER — Telehealth: Payer: Self-pay | Admitting: Pharmacist

## 2020-08-07 NOTE — Telephone Encounter (Signed)
Spoke with pt about lab results, answered all questions. She is aware to continue on Repatha.

## 2020-08-07 NOTE — Telephone Encounter (Signed)
LDL excellent on follow up labs, now at goal < 100 since starting Repatha. Left message for pt to make her aware. She also dropped off a list of questions when she came in for labs on Friday. Asking again about Repatha refills and pneumonia vaccine. Have addressed these both in prior phone notes from 3/14 and 4/18. Will again advise pt she just needs to let her pharmacy know when she needs Repatha refilled or she can set up auto-refill since I have already sent in rx for year, and that she is fine to get her pneumonia vaccine when due - this is not offered at our office though.

## 2020-08-30 ENCOUNTER — Ambulatory Visit (INDEPENDENT_AMBULATORY_CARE_PROVIDER_SITE_OTHER): Payer: Medicare HMO | Admitting: Family Medicine

## 2020-08-30 ENCOUNTER — Other Ambulatory Visit: Payer: Self-pay

## 2020-08-30 ENCOUNTER — Encounter: Payer: Self-pay | Admitting: Family Medicine

## 2020-08-30 VITALS — BP 122/64 | HR 76 | Ht <= 58 in | Wt 164.8 lb

## 2020-08-30 DIAGNOSIS — Z833 Family history of diabetes mellitus: Secondary | ICD-10-CM

## 2020-08-30 DIAGNOSIS — I10 Essential (primary) hypertension: Secondary | ICD-10-CM

## 2020-08-30 DIAGNOSIS — E039 Hypothyroidism, unspecified: Secondary | ICD-10-CM | POA: Diagnosis not present

## 2020-08-30 DIAGNOSIS — E669 Obesity, unspecified: Secondary | ICD-10-CM | POA: Diagnosis not present

## 2020-08-30 DIAGNOSIS — Z23 Encounter for immunization: Secondary | ICD-10-CM

## 2020-08-30 LAB — POCT GLYCOSYLATED HEMOGLOBIN (HGB A1C): Hemoglobin A1C: 5.4 % (ref 4.0–5.6)

## 2020-08-30 NOTE — Assessment & Plan Note (Signed)
Obtain TSH today. 

## 2020-08-30 NOTE — Patient Instructions (Addendum)
Dear Alvis Lemmings Schorsch,   Today we discussed the following:   General health maintenance   Your blood pressure is well controlled We provided the pneumococcal vaccination called Prevnar 20.  You are now done with your pneumococcal vaccine series.   For any labs obtained today, I will send results via MyChart.  If anything is abnormal, we will reach out to you for details on further management.   Be well,   Dr. Selena Batten  =================================================================================

## 2020-08-30 NOTE — Assessment & Plan Note (Signed)
At goal. Continue lisinopril and HCTZ. BMP today.

## 2020-08-30 NOTE — Addendum Note (Signed)
Addended by: Georges Lynch T on: 08/30/2020 03:13 PM   Modules accepted: Orders, SmartSet

## 2020-08-30 NOTE — Progress Notes (Signed)
    SUBJECTIVE:   Chief compliant/HPI: annual examination  Ashley Torres is a 65 y.o. who presents today for an annual exam.   Vaccine discussion  - getting PCV 20 today   Medications: reviewed   An in person ASL interpreter present for entirety of exam.  History tabs reviewed and updated.   Review of systems form reviewed and notable for nothing.   OBJECTIVE:   BP 122/64   Pulse 76   Ht 4\' 9"  (1.448 m)   Wt 164 lb 12.8 oz (74.8 kg)   LMP  (LMP Unknown)   SpO2 95%   BMI 35.66 kg/m   General: NAD, non-toxic, well-appearing, sitting comfortably in chair   HEENT: [place]/AT. PERRLA. EOMI.  Cardiovascular: RRR, normal S1, S2. B/L 2+ RP. trace BLEE Respiratory: CTAB. No IWOB.  Abdomen: + BS. NT, ND, soft to palpation.  Extremities: Warm and well perfused. Moving spontaneously.  Integumentary: No obvious rashes, lesions, trauma on general exam. Neuro: A & O x4. CN grossly intact. No FND  ASSESSMENT/PLAN:   HTN (hypertension) At goal. Continue lisinopril and HCTZ. BMP today.   Hypothyroidism (acquired) Obtain TSH today   Annual Examination  See AVS for age appropriate recommendations  PHQ score 0, reviewed and discussed.  BP reviewed and at goal.   Considered the following items based upon USPSTF recommendations: Diabetes screening: ordered Screening for elevated cholesterol:  treated HIV testing:  done Hepatitis C:  done Syphilis if at high risk: n/a Osteoporosis screening considered based upon risk of fracture from [facility] calculator. Major osteoporotic fracture risk is 6.3%. DEXA not ordered.  Reviewed risk factors for latent tuberculosis and not indicated  Cervical cancer screening:  12/28/17 Breast cancer screening:  05/16/20 done Colorectal cancer screening: up to date on screening for CRC. Lung cancer screening: n/a.  Vaccinations PCV today.   Follow up in 1 year or sooner if indicated.    07/16/20, MD [facility]

## 2020-09-01 LAB — COMPREHENSIVE METABOLIC PANEL

## 2020-09-01 LAB — TSH

## 2020-09-04 ENCOUNTER — Other Ambulatory Visit: Payer: Self-pay | Admitting: Family Medicine

## 2020-09-04 DIAGNOSIS — E669 Obesity, unspecified: Secondary | ICD-10-CM

## 2020-09-04 DIAGNOSIS — E039 Hypothyroidism, unspecified: Secondary | ICD-10-CM

## 2020-09-04 DIAGNOSIS — I10 Essential (primary) hypertension: Secondary | ICD-10-CM

## 2020-09-04 NOTE — Progress Notes (Signed)
Patient's labs unable to be processed due to insufficient sample. Spoke to patient over the phone and she will come in for repeat at her earliest convenience. Future orders placed.   Melene Plan, M.D.  9:59 AM 09/04/2020

## 2020-09-21 ENCOUNTER — Other Ambulatory Visit: Payer: Self-pay

## 2020-09-21 ENCOUNTER — Other Ambulatory Visit: Payer: Medicare HMO

## 2020-09-21 DIAGNOSIS — E669 Obesity, unspecified: Secondary | ICD-10-CM | POA: Diagnosis not present

## 2020-09-21 DIAGNOSIS — E039 Hypothyroidism, unspecified: Secondary | ICD-10-CM

## 2020-09-21 DIAGNOSIS — I10 Essential (primary) hypertension: Secondary | ICD-10-CM

## 2020-09-22 LAB — COMPREHENSIVE METABOLIC PANEL
ALT: 33 IU/L — ABNORMAL HIGH (ref 0–32)
AST: 25 IU/L (ref 0–40)
Albumin/Globulin Ratio: 1.7 (ref 1.2–2.2)
Albumin: 4.5 g/dL (ref 3.8–4.8)
Alkaline Phosphatase: 76 IU/L (ref 44–121)
BUN/Creatinine Ratio: 28 (ref 12–28)
BUN: 22 mg/dL (ref 8–27)
Bilirubin Total: 0.3 mg/dL (ref 0.0–1.2)
CO2: 26 mmol/L (ref 20–29)
Calcium: 9.7 mg/dL (ref 8.7–10.3)
Chloride: 98 mmol/L (ref 96–106)
Creatinine, Ser: 0.8 mg/dL (ref 0.57–1.00)
Globulin, Total: 2.6 g/dL (ref 1.5–4.5)
Glucose: 81 mg/dL (ref 65–99)
Potassium: 4.1 mmol/L (ref 3.5–5.2)
Sodium: 139 mmol/L (ref 134–144)
Total Protein: 7.1 g/dL (ref 6.0–8.5)
eGFR: 83 mL/min/{1.73_m2} (ref >59)

## 2020-09-22 LAB — TSH: TSH: 0.506 u[IU]/mL (ref 0.450–4.500)

## 2020-09-23 ENCOUNTER — Encounter: Payer: Self-pay | Admitting: Family Medicine

## 2020-10-03 ENCOUNTER — Other Ambulatory Visit: Payer: Self-pay | Admitting: Family Medicine

## 2020-10-03 DIAGNOSIS — I10 Essential (primary) hypertension: Secondary | ICD-10-CM

## 2020-10-06 ENCOUNTER — Telehealth: Payer: Self-pay | Admitting: Cardiology

## 2020-10-06 NOTE — Telephone Encounter (Signed)
Pt is supposed to be taking Repatha injectable but when she go to [facility] to get it filled, the pharmacy is giving her Rovustatin which is incorrect. Please advise pt of which medicine she is supposed to be taking. Pt's conversation was interpreted by Sign Language Interpreter

## 2020-10-06 NOTE — Telephone Encounter (Signed)
Will route this message to the appropriate party, our Pharmacist, for they manage this medication for the pt.

## 2020-10-09 NOTE — Telephone Encounter (Signed)
This is patient's 3rd call lin asking about Repatha refills. I have previously advised her on multiple occasions to let her pharmacy know when she needs a refill as I already sent in a 3 month supply of med with a year's worth of refills. Her rosuvastatin was stopped 4 months ago due to side effects, she should not be picking up rosuvastatin from the pharmacy.  I called her pharmacy to have them d/c her rosuvastatin to help avoid pt confusion. They also stated pt is not due for a Repatha refill until August 23 (she is getting this every 3 months). Called pt to advise her of this, she was appreciative of the call.

## 2020-11-06 ENCOUNTER — Telehealth: Payer: Self-pay | Admitting: Pharmacist

## 2020-11-06 NOTE — Telephone Encounter (Signed)
Pt called clinic asking if she needs to have labs checked for her cholesterol. Advised pt she already had labs checked after starting Repatha, LDL decreased from 142 to 74. Advised pt she does not need labs checked in the near future, will just be annual labs for insurance renewal. She verbalized understanding.

## 2020-12-07 ENCOUNTER — Telehealth: Payer: Self-pay | Admitting: Physician Assistant

## 2020-12-07 NOTE — Telephone Encounter (Signed)
Pt has been advised that Repatha injections should be given once every 14 days.

## 2020-12-07 NOTE — Telephone Encounter (Signed)
Pt c/o medication issue:  1. Name of Medication: Evolocumab (REPATHA SURECLICK) 140 MG/ML SOAJ  2. How are you currently taking this medication (dosage and times per day)? Once every 2 weeks   3. Are you having a reaction (difficulty breathing--STAT)?   4. What is your medication issue? The patient wanted to know if it was more important to give herself the injection every 2 weeks or twice a month.  She is concerned because September has 5 weeks. Please advise   The patient called using a sign language interpreter.

## 2020-12-19 ENCOUNTER — Other Ambulatory Visit: Payer: Self-pay | Admitting: Family Medicine

## 2020-12-19 DIAGNOSIS — E039 Hypothyroidism, unspecified: Secondary | ICD-10-CM

## 2020-12-21 ENCOUNTER — Other Ambulatory Visit: Payer: Self-pay | Admitting: Family Medicine

## 2020-12-21 DIAGNOSIS — I10 Essential (primary) hypertension: Secondary | ICD-10-CM

## 2021-01-04 NOTE — Progress Notes (Signed)
Cardiology Office Note   Date:  01/05/2021   ID:  Ashley Torres, DOB 07-03-1955, MRN [id number]  PCP:  Darral Dash, DO  Cardiologist:  Dr. Shari Prows     Chief Complaint  Patient presents with   AVR   HLD      History of Present Illness: Ashley Torres is a 65 y.o. female who presents for AVR.  She has a hx of congenital deafness, bicuspid aortic valve status post bioprosthetic aortic valve replacement in 03/2015, coarctation of the aorta status post repair as a child, hypertension, hyperlipidemia, hypothyroidism, chronic lower extremity edema, chronic sinusitis, and GERD    Per Dr. Lindaann Slough notes, she had coarctation repair around 65 years of age. In 2016 she was found to have severe aortic stenosis. Pre-op cath 02/2015 at [facility]) showed normal coronaries. She subsequently underwent aortic valve replacement with annulus enlargement (19 mm Magna model 300 Carpentier-Edwards bovine pericardial heart valve) on 04/17/15 at [facility] Last echo 08/2017 showed moderate focal BSH of the septum, EF 60-65%, grade 1 DD, high VFP, /p AVR with mildly elevated mean gradient of 21 mmHg; no AI; trace TR with mild pulmonary hypertension; elevated velocity in descending aorta (2.7 m/s); suggested CTA or MRA to further assess coarctation. CT scan 09/2017 showed "some expected narrowing of the distal arch and proximal descending thoracic aorta at the level of prior coarctation repair. 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 This can be followed in the future by CTA or MRA. Elevated aortic velocity may be secondary to the underlying smoothly tapered narrowing in combination with significant tortuosity present. The lack of significant collateral arteries may argue against a hemodynamically significant recurrent stenosis at the level of prior repair." Dr. Delton See felt there  was some expected narrowing but not significant and recommended continued surveillance. She was seen in the ED 02/2018 with acute on chronic dizziness with unrevealing workup. She declined MRI at that time. Meclizine resolved her symptoms. She was previously offered furosemide for her chronic edema but opted against this    Last seen 04/26/20 by Ms. Geni Bers - f/u with same day echo b/c of transportation issues. There is a sign language interpretor with her. Overall the patient is doing well. Denies chest pain, shortness of breath, dizziness. Hasn't exercised for 2 weeks in Jan because she was sick and doesn't live in a safe area to walk. Waiting to go back to the gym when safe with covid19. She may be moving to [place] soon if it works out.     At lipid clinic and placed on crestor. She was found to be intolerant to crestor, and has been intolerant to lipitor.  Avoiding zetia due to GI issues.  In March placed on repatha. Last lipids in Ma with HDL 51 and LDL 74  Echo 04/26/20 with normal EF and otherwise no change from prior echoes.   Today she feels great, still living in [place].  She has interpreter with her.  No chest pain and no SOB.  Some swelling but she does not pay any attention to it.  No blood in stools or urine. No syncope. No problems with repatha.    Past Medical History:  Diagnosis Date   Allergic rhinitis    Anxiety    Aortic stenosis    Aphakia of left eye    Aphakia of left eye 06/29/2014   Bicuspid aortic  valve    Cervical prolapse 09/14/2014   Stage 3 cervical prolapse with urge incontinence per Gynecology. Referred to pelvic floor PT and bladder training   Chronic back pain 2015   Chronic edema    Chronic sinusitis    Deafness congenital    Drusen of right optic disc    Environmental and seasonal allergies    Gastric stress ulcer    GERD (gastroesophageal reflux disease) 1990   H/O aortic coarctation repair    Hyperlipidemia 1997   Hypertension 1997    Hypothyroidism    Nuclear sclerosis of right eye    Obesity    Rubella syndrome, congenital    S/P AVR (aortic valve replacement)    Scoliosis    Uterine prolapse    Vision impairment    Vision loss, left eye     Past Surgical History:  Procedure Laterality Date   AORTIC VALVE REPLACEMENT  2017   breast biopsies     2001, 2002, & 2005    BREAST EXCISIONAL BIOPSY Left    CARDIAC CATHETERIZATION     COARCTATION OF AORTA REPAIR     repaired at age 63   EYE SURGERY  1964   blindness caused by german measles   THYROID SURGERY  1997   benign mass     Current Outpatient Medications  Medication Sig Dispense Refill   acetaminophen (TYLENOL) 500 MG tablet Take 500 mg by mouth daily as needed for moderate pain.      albuterol (VENTOLIN HFA) 108 (90 Base) MCG/ACT inhaler INHALE 2 PUFFS INTO THE LUNGS EVERY 4 HOURS AS NEEDED FOR WHEEZING OR SHORTNESS OF BREATH 6.7 g 3   albuterol (VENTOLIN HFA) 108 (90 Base) MCG/ACT inhaler INHALE 2 PUFFS INTO THE LUNGS EVERY 4 HOURS AS NEEDED FOR WHEEZING OR SHORTNESS OF BREATH 6.7 g 3   amoxicillin (AMOXIL) 500 MG tablet TAKE 4 TABLETS BY MOUTH 30-60 MINS PRIOR TO DENTAL PROCEDURES 4 tablet 2   aspirin EC 81 MG tablet Take 81 mg by mouth daily.     Calcium Carb-Cholecalciferol [phone] MG-UNIT CAPS Take 2 tablets by mouth daily.     calcium carbonate (TUMS - DOSED IN MG ELEMENTAL CALCIUM) 500 MG chewable tablet Chew 1 tablet by mouth 4 (four) times daily.     chlorpheniramine (CHLOR-TRIMETON) 4 MG tablet Take 4 mg by mouth as needed for allergies.     Evolocumab (REPATHA SURECLICK) 140 MG/ML SOAJ Inject 1 pen into the skin every 14 (fourteen) days. 6 mL 3   famotidine (PEPCID) 10 MG tablet Take 1 tablet (10 mg total) by mouth daily. 90 tablet 3   fexofenadine (ALLEGRA) 180 MG tablet Take 180 mg by mouth daily.     fluticasone (FLONASE) 50 MCG/ACT nasal spray Place 2 sprays into both nostrils daily.     FOLIC ACID PO Take 1 tablet by mouth daily.      GLYCERIN-HYPROMELLOSE-PEG 400 OP Apply 1 drop to eye as needed (Dry eyes).     hydrochlorothiazide (HYDRODIURIL) 25 MG tablet TAKE 1 TABLET(25 MG) BY MOUTH DAILY 90 tablet 1   levothyroxine (SYNTHROID) 100 MCG tablet TAKE 1 TABLET BY MOUTH DAILY BEFORE BREAKFAST 90 tablet 3   lisinopril (ZESTRIL) 10 MG tablet TAKE 1 TABLET BY MOUTH EVERY DAY 90 tablet 0   meclizine (ANTIVERT) 25 MG tablet Take 1 tablet (25 mg total) by mouth 3 (three) times daily as needed for dizziness. 30 tablet 3   Multiple Vitamins-Minerals (MULTIVITAMIN ADULTS 50+ PO) Take by  mouth daily.     Multiple Vitamins-Minerals (OCUVITE PRESERVISION PO) Take 1 tablet by mouth daily.     Pseudoephedrine-APAP-DM (DAYQUIL PO) Take by mouth as needed (allergies).     vitamin C (ASCORBIC ACID) 250 MG tablet Take 250 mg by mouth daily.     Vitamin E 100 units TABS Take 1 tablet by mouth daily.     No current facility-administered medications for this visit.    Allergies:   Statins, Atorvastatin, Erythromycin, Furosemide, Hydrocodone-acetaminophen, Meloxicam, Metoprolol, Montelukast, Omeprazole, Potassium chloride, and Rosuvastatin    Social History:  The patient  reports that she has never smoked. She has never used smokeless tobacco. She reports that she does not drink alcohol and does not use drugs.   Family History:  The patient's family history includes Arthritis in her brother and father; Asthma in her mother; Breast cancer in an other family member; Diabetes in her paternal grandmother; GER disease in her father; Heart murmur in her father; Hyperlipidemia in her father; Other in her mother; Rheumatic fever in her paternal grandmother; Sinusitis in her brother.    ROS:  General:no colds or fevers, no weight changes Skin:no rashes or ulcers HEENT:no blurred vision, no congestion- deafness, with interpreter. CV:see HPI PUL:see HPI GI:no diarrhea constipation or melena, no indigestion GU:no hematuria, no dysuria MS:no joint  pain, no claudication Neuro:no syncope, no lightheadedness Endo:no diabetes, + thyroid disease  Wt Readings from Last 3 Encounters:  01/05/21 163 lb (73.9 kg)  08/30/20 164 lb 12.8 oz (74.8 kg)  12/22/19 174 lb 3.2 oz (79 kg)     PHYSICAL EXAM: VS:  BP 122/80   Pulse 81   Ht 4' 9.5" (1.461 m)   Wt 163 lb (73.9 kg)   LMP  (LMP Unknown)   SpO2 95%   BMI 34.66 kg/m  , BMI Body mass index is 34.66 kg/m. General:Pleasant affect, NAD Skin:Warm and dry, brisk capillary refill HEENT:normocephalic, sclera clear, mucus membranes moist Neck:supple, no JVD, no bruits  Heart:S1S2 RRR with 3/6 systolic murmur, no gallup, rub or click Lungs:clear without rales, rhonchi, or wheezes JJO:ACZY, non tender, + BS, do not palpate liver spleen or masses Ext:2+ lower ext edema, 2+ pedal pulses, 2+ radial pulses Neuro:alert and oriented X 3, MAE, follows commands, + facial symmetry    EKG:  EKG is NOT ordered today.   Recent Labs: 04/26/2020: Hemoglobin 14.6; Platelets 226 09/21/2020: ALT 33; BUN 22; Creatinine, Ser 0.80; Potassium 4.1; Sodium 139; TSH 0.506    Lipid Panel    Component Value Date/Time   CHOL 149 08/04/2020 1231   TRIG 138 08/04/2020 1231   HDL 51 08/04/2020 1231   CHOLHDL 2.9 08/04/2020 1231   LDLCALC 74 08/04/2020 1231   LDLDIRECT 135 (H) 12/13/2016 1440       Other studies Reviewed: Additional studies/ records that were reviewed today include: .  Echo 04/26/20 IMPRESSIONS     1. Left ventricular ejection fraction, by estimation, is 60 to 65%. The  left ventricle has normal function. The left ventricle has no regional  wall motion abnormalities. There is mild concentric left ventricular  hypertrophy. Left ventricular diastolic  parameters are consistent with Grade I diastolic dysfunction (impaired  relaxation).   2. Right ventricular systolic function is normal. The right ventricular  size is normal. Tricuspid regurgitation signal is inadequate for assessing  PA  pressure.   3. Left atrial size was mildly dilated.   4. The mitral valve is normal in structure. No evidence of mitral  valve  regurgitation. No evidence of mitral stenosis.   5. Aortic prosthetic gradients are moderately increased, but are not  changed from 2019 and 2017. This probably reflects mild patient-valve  mismatch, considering the small prosthesis size.. The aortic valve has  been repaired/replaced. Aortic valve  regurgitation is not visualized. Mild to moderate aortic valve stenosis.  There is a 19 mm Carpentier bioprosthetic valve present in the aortic  position.   6. There is systolic turbulence in the proximal descending aorta, but  there is mild residual coarctation (peak gradient 24 mm Hg, mean gradient  13 mm Hg).   7. The inferior vena cava is dilated in size with <50% respiratory  variability, suggesting right atrial pressure of 15 mmHg.   Comparison(s): Prior images unable to be directly viewed, comparison made  by report only. No significant change from prior study.   FINDINGS   Left Ventricle: Left ventricular ejection fraction, by estimation, is 60  to 65%. The left ventricle has normal function. The left ventricle has no  regional wall motion abnormalities. The left ventricular internal cavity  size was normal in size. There is   mild concentric left ventricular hypertrophy. Left ventricular diastolic  parameters are consistent with Grade I diastolic dysfunction (impaired  relaxation). Indeterminate filling pressures.   Right Ventricle: The right ventricular size is normal. No increase in  right ventricular wall thickness. Right ventricular systolic function is  normal. Tricuspid regurgitation signal is inadequate for assessing PA  pressure.   Left Atrium: Left atrial size was mildly dilated.   Right Atrium: Right atrial size was normal in size.   Pericardium: There is no evidence of pericardial effusion.   Mitral Valve: The mitral valve is normal in  structure. No evidence of  mitral valve regurgitation. No evidence of mitral valve stenosis.   Tricuspid Valve: The tricuspid valve is normal in structure. Tricuspid  valve regurgitation is not demonstrated. No evidence of tricuspid  stenosis.   Aortic Valve: Aortic prosthetic gradients are moderately increased, but  are not changed from 2019 and 2017. This probably reflects mild  patient-valve mismatch, considering the small prosthesis size. The aortic  valve has been repaired/replaced. Aortic  valve regurgitation is not visualized. Mild to moderate aortic stenosis is  present. Aortic valve mean gradient measures 22.8 mmHg. Aortic valve peak  gradient measures 35.8 mmHg. There is a 19 mm Carpentier bioprosthetic  valve present in the aortic  position.   Pulmonic Valve: The pulmonic valve was normal in structure. Pulmonic valve  regurgitation is not visualized. No evidence of pulmonic stenosis.   Aorta: There is systolic turbulence in the proximal descending aorta, but  there is mild residual coarctation (peak gradient 24 mm Hg, mean gradient  13 mm Hg). The aortic root and ascending aorta are structurally normal,  with no evidence of dilitation.   Venous: The inferior vena cava is dilated in size with less than 50%  respiratory variability, suggesting right atrial pressure of 15 mmHg.   IAS/Shunts: No atrial level shunt detected by color flow Doppler.     2D Echo 08/2017 - Left ventricle: The cavity size was normal. There was moderate    focal basal hypertrophy of the septum. Systolic function was    normal. The estimated ejection fraction was in the range of 60%    to 65%. Wall motion was normal; there were no regional wall    motion abnormalities. Doppler parameters are consistent with    abnormal left ventricular relaxation (grade 1  diastolic    dysfunction). Doppler parameters are consistent with high    ventricular filling pressure.  - Aortic valve: A bioprosthesis was  present.  - Mitral valve: Calcified annulus.  - Pulmonary arteries: Systolic pressure was mildly increased. PA    peak pressure: 44 mm Hg (S).   Impressions:   - Normal LV function; mild diastolic dysfunction; s/p AVR with    mildly elevated mean gradient of 21 mmHg; no AI; trace TR with    mild pulmonary hypertension; elevated velocity in descending    aorta (2.7 m/s); suggest CTA or MRA to further assess    coarctation.    CTA 06/2019 IMPRESSION: 1. Stable postsurgical changes of the distal aortic arch compatible patient's previous aortic coarctation repair. Stable narrowing at the repair site of approximately 11-12 mm. Stable small pseudoaneurysm extending superiorly from the region of the repair site. Recommend continued follow-up CTA chest as clinically indicated.   2. Mild cardiomegaly and evidence of previous aortic valve replacement.   3. 7 mm upper pole left renal calcification unchanged. Hepatic steatosis.   4.  Stable severe curvature of the thoracic spine convex right.     ASSESSMENT AND PLAN:  1.  Bicuspid aortic valve and hx of coarctation of the aorta.  With AVR in 2017, follow up echoes have mild increase of gradient may be due to mismatch.  But valve is stable.  Will recheck prior to next appt with Dr. Shari Prows. Normal LV function.  SBE prophylaxis. And coarctation repair stable on CT 06/2019. Stable small  pseudoaneurysm extending superiorly from the region of the repair site. Recommend continued follow-up CTA chest as clinically indicated. Consider repeat at next appt.   2. Chronic combined systolic and diastolic HF with chronic lower ext edema.  She does watch her salt intake.  Is on HCTZ, no changes for now.   3.  Htn controlled continue meds  4.  HLD doing well on Repatha check lipids and hepatic at next office visit.   5.  Congential deafness.  Interpreter with pt.   Current medicines are reviewed with the patient today.  The patient Has no concerns  regarding medicines.  The following changes have been made:  See above Labs/ tests ordered today include:see above  Disposition:   FU:  see above  Signed, Nada Boozer, NP  01/05/2021 10:54 AM    [facility] [address] [place], [place], [place]  [address]/ [address] [place], [place] Phone: [phone]; Fax: [phone]  [phone]

## 2021-01-05 ENCOUNTER — Encounter: Payer: Self-pay | Admitting: Cardiology

## 2021-01-05 ENCOUNTER — Ambulatory Visit (INDEPENDENT_AMBULATORY_CARE_PROVIDER_SITE_OTHER): Payer: Medicare HMO | Admitting: Cardiology

## 2021-01-05 ENCOUNTER — Other Ambulatory Visit: Payer: Self-pay

## 2021-01-05 VITALS — BP 122/80 | HR 81 | Ht <= 58 in | Wt 163.0 lb

## 2021-01-05 DIAGNOSIS — I5042 Chronic combined systolic (congestive) and diastolic (congestive) heart failure: Secondary | ICD-10-CM

## 2021-01-05 DIAGNOSIS — I10 Essential (primary) hypertension: Secondary | ICD-10-CM

## 2021-01-05 DIAGNOSIS — Z952 Presence of prosthetic heart valve: Secondary | ICD-10-CM | POA: Diagnosis not present

## 2021-01-05 DIAGNOSIS — Z8774 Personal history of (corrected) congenital malformations of heart and circulatory system: Secondary | ICD-10-CM

## 2021-01-05 DIAGNOSIS — E785 Hyperlipidemia, unspecified: Secondary | ICD-10-CM | POA: Diagnosis not present

## 2021-01-05 NOTE — Patient Instructions (Signed)
Medication Instructions:  Your physician recommends that you continue on your current medications as directed. Please refer to the Current Medication list given to you today.  *If you need a refill on your cardiac medications before your next appointment, please call your pharmacy*   Lab Work: TO BE DONE IN 6 MONTHS: CMET, LIPIDS If you have labs (blood work) drawn today and your tests are completely normal, you will receive your results only by: MyChart Message (if you have MyChart) OR A paper copy in the mail If you have any lab test that is abnormal or we need to change your treatment, we will call you to review the results.   Testing/Procedures: Your physician has requested that you have an echocardiogram. Echocardiography is a painless test that uses sound waves to create images of your heart. It provides your doctor with information about the size and shape of your heart and how well your heart's chambers and valves are working. This procedure takes approximately one hour. There are no restrictions for this procedure. TO BE DONE IS 6 MONTHS   Follow-Up: At [facility], you and your health needs are our priority.  As part of our continuing mission to provide you with exceptional heart care, we have created designated Provider Care Teams.  These Care Teams include your primary Cardiologist (physician) and Advanced Practice Providers (APPs -  Physician Assistants and Nurse Practitioners) who all work together to provide you with the care you need, when you need it.  We recommend signing up for the patient portal called "MyChart".  Sign up information is provided on this After Visit Summary.  MyChart is used to connect with patients for Virtual Visits (Telemedicine).  Patients are able to view lab/test results, encounter notes, upcoming appointments, etc.  Non-urgent messages can be sent to your provider as well.   To learn more about what you can do with MyChart, go to  [email or web address].    Your next appointment:   6 month(s)  The format for your next appointment:   In Person  Provider:   You may see DR. PEMBERTON or one of the following Advanced Practice Providers on your designated Care Team:   Tereso Newcomer, PA-C [place], [place]   Other Instructions Echocardiogram An echocardiogram is a test that uses sound waves (ultrasound) to produce images of the heart. Images from an echocardiogram can provide important information about: Heart size and shape. The size and thickness and movement of your heart's walls. Heart muscle function and strength. Heart valve function or if you have stenosis. Stenosis is when the heart valves are too narrow. If blood is flowing backward through the heart valves (regurgitation). A tumor or infectious growth around the heart valves. Areas of heart muscle that are not working well because of poor blood flow or injury from a heart attack. Aneurysm detection. An aneurysm is a weak or damaged part of an artery wall. The wall bulges out from the normal force of blood pumping through the body. Tell a health care provider about: Any allergies you have. All medicines you are taking, including vitamins, herbs, eye drops, creams, and over-the-counter medicines. Any blood disorders you have. Any surgeries you have had. Any medical conditions you have. Whether you are pregnant or may be pregnant. What are the risks? Generally, this is a safe test. However, problems may occur, including an allergic reaction to dye (contrast) that may be used during the test. What happens before the test? No specific preparation is needed. You  may eat and drink normally. What happens during the test?  You will take off your clothes from the waist up and put on a hospital gown. Electrodes or electrocardiogram (ECG)patches may be placed on your chest. The electrodes or patches are then connected to a device that monitors your heart rate  and rhythm. You will lie down on a table for an ultrasound exam. A gel will be applied to your chest to help sound waves pass through your skin. A handheld device, called a transducer, will be pressed against your chest and moved over your heart. The transducer produces sound waves that travel to your heart and bounce back (or "echo" back) to the transducer. These sound waves will be captured in real-time and changed into images of your heart that can be viewed on a video monitor. The images will be recorded on a computer and reviewed by your health care provider. You may be asked to change positions or hold your breath for a short time. This makes it easier to get different views or better views of your heart. In some cases, you may receive contrast through an IV in one of your veins. This can improve the quality of the pictures from your heart. The procedure may vary among health care providers and hospitals. What can I expect after the test? You may return to your normal, everyday life, including diet, activities, and medicines, unless your health care provider tells you not to do that. Follow these instructions at home: It is up to you to get the results of your test. Ask your health care provider, or the department that is doing the test, when your results will be ready. Keep all follow-up visits. This is important. Summary An echocardiogram is a test that uses sound waves (ultrasound) to produce images of the heart. Images from an echocardiogram can provide important information about the size and shape of your heart, heart muscle function, heart valve function, and other possible heart problems. You do not need to do anything to prepare before this test. You may eat and drink normally. After the echocardiogram is completed, you may return to your normal, everyday life, unless your health care provider tells you not to do that. This information is not intended to replace advice given to you by  your health care provider. Make sure you discuss any questions you have with your health care provider. Document Revised: 10/26/2019 Document Reviewed: 10/26/2019 Elsevier Patient Education  2022 [employer].

## 2021-01-23 ENCOUNTER — Telehealth: Payer: Self-pay | Admitting: Cardiology

## 2021-01-23 NOTE — Telephone Encounter (Signed)
Pt c/o medication issue:  1. Name of Medication: Evolocumab (REPATHA SURECLICK) 140 MG/ML SOAJ  2. How are you currently taking this medication (dosage and times per day)? Inject every 14 days  3. Are you having a reaction (difficulty breathing--STAT)? no  4. What is your medication issue? Patient states the medication needs a prior authorization. [employer] Phone: [phone], Fax: [phone]

## 2021-01-24 MED ORDER — PRALUENT 75 MG/ML ~~LOC~~ SOAJ: 1.0000 "pen " | SUBCUTANEOUS | 3 refills | Status: DC

## 2021-01-24 NOTE — Addendum Note (Signed)
Addended by: Esparanza Krider E on: 01/24/2021 11:17 AM   Modules accepted: Orders

## 2021-01-24 NOTE — Telephone Encounter (Signed)
Called the pharmacy and retrieved the drug insurance. Only to start the pa process and find out that repatha isn't covered by plan. I will have to route this to tmegan supple rph to see if she would recommend changing to praluent and if so, which strength.  Id [id number] Bin [id number] Pcn meddaet Grp rxaetd

## 2021-01-24 NOTE — Telephone Encounter (Addendum)
Praluent PA approved through 03/17/21. Rx sent to pharmacy, pt aware to change to Praluent once she uses up her home supply of Repatha. Has both Medicare Part D and Medicaid so copay should be affordable. She's aware to call with any concerns.

## 2021-01-24 NOTE — Telephone Encounter (Addendum)
Ok to submit for Praluent 75mg  Q2W prior auth, I called pt to make her aware of reason for med change.

## 2021-01-24 NOTE — Telephone Encounter (Signed)
Pa submitted for praluent 75 Tonique Meik[employer]john (Key: [id number])

## 2021-02-02 DIAGNOSIS — H4710 Unspecified papilledema: Secondary | ICD-10-CM | POA: Diagnosis not present

## 2021-02-05 ENCOUNTER — Observation Stay (HOSPITAL_COMMUNITY): Payer: Medicare HMO

## 2021-02-05 ENCOUNTER — Inpatient Hospital Stay (HOSPITAL_COMMUNITY)
Admission: EM | Admit: 2021-02-05 | Discharge: 2021-02-11 | DRG: 123 | Payer: Medicare HMO | Attending: Family Medicine | Admitting: Family Medicine

## 2021-02-05 ENCOUNTER — Emergency Department (HOSPITAL_COMMUNITY): Payer: Medicare HMO

## 2021-02-05 DIAGNOSIS — H5462 Unqualified visual loss, left eye, normal vision right eye: Secondary | ICD-10-CM | POA: Diagnosis present

## 2021-02-05 DIAGNOSIS — I10 Essential (primary) hypertension: Secondary | ICD-10-CM | POA: Diagnosis present

## 2021-02-05 DIAGNOSIS — E669 Obesity, unspecified: Secondary | ICD-10-CM | POA: Diagnosis present

## 2021-02-05 DIAGNOSIS — I5032 Chronic diastolic (congestive) heart failure: Secondary | ICD-10-CM | POA: Diagnosis present

## 2021-02-05 DIAGNOSIS — E039 Hypothyroidism, unspecified: Secondary | ICD-10-CM | POA: Diagnosis present

## 2021-02-05 DIAGNOSIS — Z20822 Contact with and (suspected) exposure to covid-19: Secondary | ICD-10-CM | POA: Diagnosis present

## 2021-02-05 DIAGNOSIS — H5440 Blindness, one eye, unspecified eye: Secondary | ICD-10-CM | POA: Diagnosis present

## 2021-02-05 DIAGNOSIS — Z885 Allergy status to narcotic agent status: Secondary | ICD-10-CM

## 2021-02-05 DIAGNOSIS — Z825 Family history of asthma and other chronic lower respiratory diseases: Secondary | ICD-10-CM

## 2021-02-05 DIAGNOSIS — Z833 Family history of diabetes mellitus: Secondary | ICD-10-CM

## 2021-02-05 DIAGNOSIS — H4710 Unspecified papilledema: Secondary | ICD-10-CM | POA: Diagnosis not present

## 2021-02-05 DIAGNOSIS — I110 Hypertensive heart disease with heart failure: Secondary | ICD-10-CM | POA: Diagnosis present

## 2021-02-05 DIAGNOSIS — H905 Unspecified sensorineural hearing loss: Secondary | ICD-10-CM | POA: Diagnosis present

## 2021-02-05 DIAGNOSIS — H469 Unspecified optic neuritis: Secondary | ICD-10-CM | POA: Diagnosis not present

## 2021-02-05 DIAGNOSIS — H903 Sensorineural hearing loss, bilateral: Secondary | ICD-10-CM | POA: Diagnosis present

## 2021-02-05 DIAGNOSIS — H5340 Unspecified visual field defects: Secondary | ICD-10-CM | POA: Diagnosis present

## 2021-02-05 DIAGNOSIS — Z79890 Hormone replacement therapy (postmenopausal): Secondary | ICD-10-CM

## 2021-02-05 DIAGNOSIS — H538 Other visual disturbances: Secondary | ICD-10-CM | POA: Diagnosis not present

## 2021-02-05 DIAGNOSIS — H2702 Aphakia, left eye: Secondary | ICD-10-CM | POA: Diagnosis present

## 2021-02-05 DIAGNOSIS — Z7982 Long term (current) use of aspirin: Secondary | ICD-10-CM

## 2021-02-05 DIAGNOSIS — Z8711 Personal history of peptic ulcer disease: Secondary | ICD-10-CM

## 2021-02-05 DIAGNOSIS — Z881 Allergy status to other antibiotic agents status: Secondary | ICD-10-CM

## 2021-02-05 DIAGNOSIS — Z79899 Other long term (current) drug therapy: Secondary | ICD-10-CM

## 2021-02-05 DIAGNOSIS — E785 Hyperlipidemia, unspecified: Secondary | ICD-10-CM | POA: Diagnosis present

## 2021-02-05 DIAGNOSIS — Z803 Family history of malignant neoplasm of breast: Secondary | ICD-10-CM

## 2021-02-05 DIAGNOSIS — M419 Scoliosis, unspecified: Secondary | ICD-10-CM | POA: Diagnosis present

## 2021-02-05 DIAGNOSIS — F40240 Claustrophobia: Secondary | ICD-10-CM | POA: Diagnosis present

## 2021-02-05 DIAGNOSIS — Z83438 Family history of other disorder of lipoprotein metabolism and other lipidemia: Secondary | ICD-10-CM

## 2021-02-05 DIAGNOSIS — Z5309 Procedure and treatment not carried out because of other contraindication: Secondary | ICD-10-CM | POA: Diagnosis not present

## 2021-02-05 DIAGNOSIS — Z888 Allergy status to other drugs, medicaments and biological substances status: Secondary | ICD-10-CM

## 2021-02-05 DIAGNOSIS — Q231 Congenital insufficiency of aortic valve: Secondary | ICD-10-CM

## 2021-02-05 DIAGNOSIS — R29818 Other symptoms and signs involving the nervous system: Secondary | ICD-10-CM | POA: Diagnosis not present

## 2021-02-05 DIAGNOSIS — Z8261 Family history of arthritis: Secondary | ICD-10-CM

## 2021-02-05 DIAGNOSIS — K219 Gastro-esophageal reflux disease without esophagitis: Secondary | ICD-10-CM | POA: Diagnosis present

## 2021-02-05 DIAGNOSIS — Z952 Presence of prosthetic heart valve: Secondary | ICD-10-CM

## 2021-02-05 DIAGNOSIS — H547 Unspecified visual loss: Secondary | ICD-10-CM | POA: Diagnosis not present

## 2021-02-05 DIAGNOSIS — H9193 Unspecified hearing loss, bilateral: Secondary | ICD-10-CM

## 2021-02-05 DIAGNOSIS — B948 Sequelae of other specified infectious and parasitic diseases: Secondary | ICD-10-CM

## 2021-02-05 HISTORY — DX: Blindness, one eye, unspecified eye: H54.40

## 2021-02-05 LAB — BASIC METABOLIC PANEL
Anion gap: 10 (ref 5–15)
BUN: 21 mg/dL (ref 8–23)
CO2: 29 mmol/L (ref 22–32)
Calcium: 9.6 mg/dL (ref 8.9–10.3)
Chloride: 103 mmol/L (ref 98–111)
Creatinine, Ser: 0.75 mg/dL (ref 0.44–1.00)
GFR, Estimated: >60 mL/min (ref >60)
Glucose, Bld: 123 mg/dL — ABNORMAL HIGH (ref 70–99)
Potassium: 3.8 mmol/L (ref 3.5–5.1)
Sodium: 142 mmol/L (ref 135–145)

## 2021-02-05 LAB — CBC WITH DIFFERENTIAL/PLATELET
Abs Immature Granulocytes: 0.03 10*3/uL (ref 0.00–0.07)
Basophils Absolute: 0 10*3/uL (ref 0.0–0.1)
Basophils Relative: 1 %
Eosinophils Absolute: 0.2 10*3/uL (ref 0.0–0.5)
Eosinophils Relative: 2 %
HCT: 43 % (ref 36.0–46.0)
Hemoglobin: 13.9 g/dL (ref 12.0–15.0)
Immature Granulocytes: 1 %
Lymphocytes Relative: 22 %
Lymphs Abs: 1.4 10*3/uL (ref 0.7–4.0)
MCH: 29.3 pg (ref 26.0–34.0)
MCHC: 32.3 g/dL (ref 30.0–36.0)
MCV: 90.5 fL (ref 80.0–100.0)
Monocytes Absolute: 0.6 10*3/uL (ref 0.1–1.0)
Monocytes Relative: 9 %
Neutro Abs: 4.1 10*3/uL (ref 1.7–7.7)
Neutrophils Relative %: 65 %
Platelets: 247 10*3/uL (ref 150–400)
RBC: 4.75 MIL/uL (ref 3.87–5.11)
RDW: 13.1 % (ref 11.5–15.5)
WBC: 6.3 10*3/uL (ref 4.0–10.5)
nRBC: 0 % (ref 0.0–0.2)

## 2021-02-05 LAB — RESP PANEL BY RT-PCR (FLU A&B, COVID) ARPGX2
Influenza A by PCR: NEGATIVE
Influenza B by PCR: NEGATIVE
SARS Coronavirus 2 by RT PCR: NEGATIVE

## 2021-02-05 MED ORDER — LORAZEPAM 2 MG/ML IJ SOLN: 0.5000 mg | Freq: Once | INTRAMUSCULAR | Status: DC

## 2021-02-05 MED ORDER — LORAZEPAM 2 MG/ML IJ SOLN
1.0000 mg | INTRAMUSCULAR | Status: DC | PRN
  Administered 2021-02-05: 1 mg via INTRAVENOUS
  Filled 2021-02-05: qty 1

## 2021-02-05 MED ORDER — LORAZEPAM 1 MG PO TABS: 0.5000 mg | ORAL_TABLET | Freq: Once | ORAL | Status: DC

## 2021-02-05 NOTE — ED Notes (Signed)
Pt ambulated to restroom with steady gait.

## 2021-02-05 NOTE — ED Notes (Signed)
IV team at the bedside. 

## 2021-02-05 NOTE — ED Provider Notes (Signed)
Emergency Medicine Provider Triage Evaluation Note  Ashley Torres , a 65 y.o. female  was evaluated in triage.  Pt complains of swelling on right eye with blurry vision, started last Wednesday.  Became constant on Friday, saw her ophthalmologist who diagnosed her with right optic disc papilledema, advised to go to the ED.  Patient not go to the ED until this time she reports that she is able to see outside the top of her right eye, but the bottom half of her right eye is blurry.  This is constant since Friday.  She also has a headache that is coming and going.  Denies any unilateral weakness or dysarthria.  Sign a good translator was used in the room.  Review of Systems  Positive: above Negative: above  Physical Exam  BP (!) 161/76 (BP Location: Left Arm)   Pulse (!) 111   Temp 99 F (37.2 C) (Oral)   Resp 18   LMP  (LMP Unknown)   SpO2 93%  Gen:   Awake, no distress   Resp:  Normal effort  MSK:   Moves extremities without difficulty  Other:  EOMI.  Note from the ophthalmology visit detailed below       Medical Decision Making  Medically screening exam initiated at 2:11 PM.  Appropriate orders placed.  Everlean Carlini was informed that the remainder of the evaluation will be completed by another provider, this initial triage assessment does not replace that evaluation, and the importance of remaining in the ED until their evaluation is complete.  Consulted my attending Dr. Shelva Majestic who agrees with proceeding with MRI brain and orbits.   Theron Arista, PA-C 02/05/21 1429    Mancel Bale, MD 02/05/21 1721

## 2021-02-05 NOTE — ED Provider Notes (Signed)
  Physical Exam  BP (!) 183/92   Pulse 91   Temp 99 F (37.2 C) (Oral)   Resp 20   LMP  (LMP Unknown)   SpO2 99%     ED Course/Procedures     Procedures  MDM    65 year old female received in signout at 7 PM.  Please see prior ED notes for further MDM.  Sent from ophthalmology clinic due to blurry vision, vision loss in half bottom of right eye. Sx present for 5 days. Needs MRI Brain and Orbits WWO contrast, Neuro consulted. Getting Ativan for MRI.   10 PM Patient administered IV Ativan but still incredibly anxious about MRI imaging.  States will not tolerate MRI without sedation.  Neurology updated.  Patient does need MRI to evaluate for possible optic neuritis.  Given need for MRI under sedation, hospitalist medicine consulted for admission.       Ernie Avena, MD 02/05/21 2200

## 2021-02-05 NOTE — ED Triage Notes (Signed)
Pt here POV with c/o of blurred vision which has increased in severity. No other neuro defects.

## 2021-02-05 NOTE — ED Provider Notes (Signed)
[facility] EMERGENCY DEPARTMENT Provider Note   CSN: [id number] Arrival date & time: 02/05/21  1348     History Chief Complaint  Patient presents with   Blurred Vision    Ashley Torres is a 65 y.o. female.  The history is provided by the patient and medical records. No language interpreter was used.   65 year old female significant history of of aphakia of both eyes, hypertension, chronic back pain, drusen's of right optic disc sent here by an eye specialist for evaluation of vision changes.  Patient has congenital deafness, history obtained through writing.  Patient developed gradual onset of blurry vision involving her right eye.  It started approximately 3 days ago in which she report decreased blurry vision to the bottom half of her R eye.  Symptoms are becoming progressively worse. Endorse mild headache without eye pain.  No fever, chills, neck pain, focal numbness or focal weakness.  She was seen by her ophthalmologist 3 days ago.  Was told that she has papilledema and recommended to come to the ER.  Due to worsening of symptoms and she presented today for further assessment.  Past Medical History:  Diagnosis Date   Allergic rhinitis    Anxiety    Aortic stenosis    Aphakia of left eye    Aphakia of left eye 06/29/2014   Bicuspid aortic valve    Cervical prolapse 09/14/2014   Stage 3 cervical prolapse with urge incontinence per Gynecology. Referred to pelvic floor PT and bladder training   Chronic back pain 2015   Chronic edema    Chronic sinusitis    Deafness congenital    Drusen of right optic disc    Environmental and seasonal allergies    Gastric stress ulcer    GERD (gastroesophageal reflux disease) 1990   H/O aortic coarctation repair    Hyperlipidemia 1997   Hypertension 1997   Hypothyroidism    Nuclear sclerosis of right eye    Obesity    Rubella syndrome, congenital    S/P AVR (aortic valve replacement)    Scoliosis    Uterine prolapse     Vision impairment    Vision loss, left eye     Patient Active Problem List   Diagnosis Date Noted   Ventral hernia without obstruction or gangrene 07/21/2019   Elevated ALT measurement 07/21/2019   Acute bacterial rhinosinusitis 12/27/2016   H/O aortic valve replacement 01/13/2016   H/O coarctation of aorta 01/13/2016   HTN (hypertension) 01/13/2016   Chronic maxillary sinusitis 02/20/2014   Deafness congenital 10/28/2013   Rubella syndrome, congenital 07/18/2013   Obesity, Class II, BMI 35-39.9 07/17/2013   Hypothyroidism (acquired) 07/17/2013   HLD (hyperlipidemia) 07/17/2013   GERD (gastroesophageal reflux disease) 07/17/2013    Past Surgical History:  Procedure Laterality Date   AORTIC VALVE REPLACEMENT  2017   breast biopsies     2001, 2002, & 2005    BREAST EXCISIONAL BIOPSY Left    CARDIAC CATHETERIZATION     COARCTATION OF AORTA REPAIR     repaired at age 34   EYE SURGERY  1964   blindness caused by german measles   THYROID SURGERY  1997   benign mass     OB History   No obstetric history on file.     Family History  Problem Relation Age of Onset   Other Mother        killed in gas explosion   Asthma Mother    Heart murmur Father  Arthritis Father    Hyperlipidemia Father    GER disease Father    Arthritis Brother    Sinusitis Brother    Diabetes Paternal Grandmother    Rheumatic fever Paternal Grandmother    Breast cancer Other    Heart disease Neg Hx     Social History   Tobacco Use   Smoking status: Never   Smokeless tobacco: Never  Vaping Use   Vaping Use: Never used  Substance Use Topics   Alcohol use: Never   Drug use: Never    Home Medications Prior to Admission medications   Medication Sig Start Date End Date Taking? Authorizing Provider  acetaminophen (TYLENOL) 500 MG tablet Take 500 mg by mouth daily as needed for moderate pain.     [provider]  albuterol (VENTOLIN HFA) 108 (90 Base) MCG/ACT inhaler INHALE 2  PUFFS INTO THE LUNGS EVERY 4 HOURS AS NEEDED FOR WHEEZING OR SHORTNESS OF BREATH 07/04/20   Melene Plan, MD  albuterol (VENTOLIN HFA) 108 (90 Base) MCG/ACT inhaler INHALE 2 PUFFS INTO THE LUNGS EVERY 4 HOURS AS NEEDED FOR WHEEZING OR SHORTNESS OF BREATH 07/04/20   Melene Plan, MD  Alirocumab (PRALUENT) 75 MG/ML SOAJ Inject 1 pen into the skin every 14 (fourteen) days. 01/24/21   Meriam Sprague, MD  amoxicillin (AMOXIL) 500 MG tablet TAKE 4 TABLETS BY MOUTH 30-60 MINS PRIOR TO DENTAL PROCEDURES 12/22/19   Dyann Kief, PA-C  aspirin EC 81 MG tablet Take 81 mg by mouth daily.    [provider]  Calcium Carb-Cholecalciferol [phone] MG-UNIT CAPS Take 2 tablets by mouth daily.    [provider]  calcium carbonate (TUMS - DOSED IN MG ELEMENTAL CALCIUM) 500 MG chewable tablet Chew 1 tablet by mouth 4 (four) times daily.    [provider]  chlorpheniramine (CHLOR-TRIMETON) 4 MG tablet Take 4 mg by mouth as needed for allergies.    [provider]  famotidine (PEPCID) 10 MG tablet Take 1 tablet (10 mg total) by mouth daily. 12/03/19   Melene Plan, MD  fexofenadine (ALLEGRA) 180 MG tablet Take 180 mg by mouth daily.    [provider]  fluticasone (FLONASE) 50 MCG/ACT nasal spray Place 2 sprays into both nostrils daily.    [provider]  FOLIC ACID PO Take 1 tablet by mouth daily.    [provider]  GLYCERIN-HYPROMELLOSE-PEG 400 OP Apply 1 drop to eye as needed (Dry eyes).    [provider]  hydrochlorothiazide (HYDRODIURIL) 25 MG tablet TAKE 1 TABLET(25 MG) BY MOUTH DAILY 12/21/20   Dameron, Nolberto Hanlon, DO  levothyroxine (SYNTHROID) 100 MCG tablet TAKE 1 TABLET BY MOUTH DAILY BEFORE BREAKFAST 12/19/20   Dameron, Nolberto Hanlon, DO  lisinopril (ZESTRIL) 10 MG tablet TAKE 1 TABLET BY MOUTH EVERY DAY 10/06/20   Dameron, Nolberto Hanlon, DO  meclizine (ANTIVERT) 25 MG tablet Take 1 tablet (25 mg total) by mouth 3 (three) times daily as needed for  dizziness. 12/03/19   Melene Plan, MD  Multiple Vitamins-Minerals (MULTIVITAMIN ADULTS 50+ PO) Take by mouth daily.    [provider]  Multiple Vitamins-Minerals (OCUVITE PRESERVISION PO) Take 1 tablet by mouth daily.    [provider]  Pseudoephedrine-APAP-DM (DAYQUIL PO) Take by mouth as needed (allergies).    [provider]  vitamin C (ASCORBIC ACID) 250 MG tablet Take 250 mg by mouth daily.    [provider]  Vitamin E 100 units TABS Take 1 tablet by  mouth daily.    [provider]    Allergies    Statins, Atorvastatin, Erythromycin, Furosemide, Hydrocodone-acetaminophen, Meloxicam, Metoprolol, Montelukast, Omeprazole, Potassium chloride, and Rosuvastatin  Review of Systems   Review of Systems  All other systems reviewed and are negative.  Physical Exam Updated Vital Signs BP (!) 161/76 (BP Location: Left Arm)   Pulse (!) 111   Temp 99 F (37.2 C) (Oral)   Resp 18   LMP  (LMP Unknown)   SpO2 93%   Physical Exam Vitals and nursing note reviewed.  Constitutional:      General: She is not in acute distress.    Appearance: She is well-developed.  HENT:     Head: Atraumatic.  Eyes:     Extraocular Movements: Extraocular movements intact.     Conjunctiva/sclera: Conjunctivae normal.     Pupils: Pupils are equal, round, and reactive to light.  Cardiovascular:     Rate and Rhythm: Normal rate and regular rhythm.     Pulses: Normal pulses.     Heart sounds: Normal heart sounds.  Pulmonary:     Effort: Pulmonary effort is normal.  Musculoskeletal:     Cervical back: Normal range of motion and neck supple.  Skin:    Findings: No rash.  Neurological:     Mental Status: She is alert.     GCS: GCS eye subscore is 4. GCS verbal subscore is 1. GCS motor subscore is 6.     Cranial Nerves: Cranial nerves 2-12 are intact.     Sensory: Sensation is intact.     Motor: Motor function is intact.  Psychiatric:        Mood and Affect:  Mood normal.    ED Results / Procedures / Treatments   Labs (all labs ordered are listed, but only abnormal results are displayed) Labs Reviewed  BASIC METABOLIC PANEL - Abnormal; Notable for the following components:      Result Value   Glucose, Bld 123 (*)    All other components within normal limits  RESP PANEL BY RT-PCR (FLU A&B, COVID) ARPGX2  CBC WITH DIFFERENTIAL/PLATELET    EKG None  Radiology No results found.  Procedures Procedures   Medications Ordered in ED Medications  LORazepam (ATIVAN) tablet 0.5 mg (has no administration in time range)  LORazepam (ATIVAN) injection 0.5 mg (has no administration in time range)    ED Course  I have reviewed the triage vital signs and the nursing notes.  Pertinent labs & imaging results that were available during my care of the patient were reviewed by me and considered in my medical decision making (see chart for details).    MDM Rules/Calculators/A&P                           BP (!) 183/92   Pulse 91   Temp 99 F (37.2 C) (Oral)   Resp 20   LMP  (LMP Unknown)   SpO2 99%   Final Clinical Impression(s) / ED Diagnoses Final diagnoses:  Blurred vision, right eye  Papilledema of right eye    Rx / DC Orders ED Discharge Orders     None      5:39 PM Patient here with blurry vision involving her right eye.  Was seen by her ophthalmologist several days prior and was noted to have evidence of right optic nerve papilledema.  She was sent here for further assessments of her condition.  I discussed care  with on-call neurology who recommend brain MRI along with right orbit MRI with and without contrast for further assessment.  Patient has congenital deafness, communication was through writing.  She also has significant anxiety and was apprehensive about the MRI.  We will give Ativan as needed.  No other stroke symptoms  6:50 PM Pt sign out to Dr. Karene Fry who will f/u on MRI result and will consult neurology for  further management as appropriate.     Fayrene Helper, PA-C 02/05/21 1851    Ernie Avena, MD 02/05/21 [phone]

## 2021-02-05 NOTE — ED Notes (Signed)
Follow up with pt about anxiety. She stated she feels relax but still is not relaxed enough to go to MRI. Notified EDP

## 2021-02-05 NOTE — ED Notes (Signed)
RN used Counselling psychologist. Pt expressed she is very scared about having an MRI. She does not feel Ativan will be enough to calm her d/t last experience was not pleasant. She would like a medication that would go through the IV and sedate her that does not cause N/V or dizziness. Notified EDP

## 2021-02-06 ENCOUNTER — Encounter (HOSPITAL_COMMUNITY): Payer: Self-pay | Admitting: Family Medicine

## 2021-02-06 DIAGNOSIS — H4710 Unspecified papilledema: Secondary | ICD-10-CM

## 2021-02-06 DIAGNOSIS — I10 Essential (primary) hypertension: Secondary | ICD-10-CM | POA: Diagnosis not present

## 2021-02-06 DIAGNOSIS — H9193 Unspecified hearing loss, bilateral: Secondary | ICD-10-CM | POA: Diagnosis not present

## 2021-02-06 DIAGNOSIS — H538 Other visual disturbances: Secondary | ICD-10-CM

## 2021-02-06 DIAGNOSIS — I5032 Chronic diastolic (congestive) heart failure: Secondary | ICD-10-CM | POA: Diagnosis not present

## 2021-02-06 LAB — COMPREHENSIVE METABOLIC PANEL
ALT: 27 U/L (ref 0–44)
AST: 26 U/L (ref 15–41)
Albumin: 3.5 g/dL (ref 3.5–5.0)
Alkaline Phosphatase: 61 U/L (ref 38–126)
Anion gap: 8 (ref 5–15)
BUN: 20 mg/dL (ref 8–23)
CO2: 28 mmol/L (ref 22–32)
Calcium: 9.4 mg/dL (ref 8.9–10.3)
Chloride: 103 mmol/L (ref 98–111)
Creatinine, Ser: 0.72 mg/dL (ref 0.44–1.00)
GFR, Estimated: >60 mL/min (ref >60)
Glucose, Bld: 115 mg/dL — ABNORMAL HIGH (ref 70–99)
Potassium: 3.7 mmol/L (ref 3.5–5.1)
Sodium: 139 mmol/L (ref 135–145)
Total Bilirubin: 0.4 mg/dL (ref 0.3–1.2)
Total Protein: 6.2 g/dL — ABNORMAL LOW (ref 6.5–8.1)

## 2021-02-06 LAB — HIV ANTIBODY (ROUTINE TESTING W REFLEX): HIV Screen 4th Generation wRfx: NONREACTIVE

## 2021-02-06 LAB — RPR: RPR Ser Ql: NONREACTIVE

## 2021-02-06 LAB — LIPID PANEL
Cholesterol: 138 mg/dL (ref 0–200)
HDL: 49 mg/dL (ref >40)
LDL Cholesterol: 49 mg/dL (ref 0–99)
Total CHOL/HDL Ratio: 2.8 RATIO
Triglycerides: 198 mg/dL — ABNORMAL HIGH (ref <150)
VLDL: 40 mg/dL (ref 0–40)

## 2021-02-06 LAB — CBC
HCT: 41.6 % (ref 36.0–46.0)
Hemoglobin: 13.6 g/dL (ref 12.0–15.0)
MCH: 29.7 pg (ref 26.0–34.0)
MCHC: 32.7 g/dL (ref 30.0–36.0)
MCV: 90.8 fL (ref 80.0–100.0)
Platelets: 217 10*3/uL (ref 150–400)
RBC: 4.58 MIL/uL (ref 3.87–5.11)
RDW: 13.1 % (ref 11.5–15.5)
WBC: 6.2 10*3/uL (ref 4.0–10.5)
nRBC: 0 % (ref 0.0–0.2)

## 2021-02-06 LAB — VITAMIN B12: Vitamin B-12: 668 pg/mL (ref 180–914)

## 2021-02-06 LAB — HEMOGLOBIN A1C
Hgb A1c MFr Bld: 5.2 % (ref 4.8–5.6)
Mean Plasma Glucose: 102.54 mg/dL

## 2021-02-06 LAB — FOLATE: Folate: 51.3 ng/mL (ref >5.9)

## 2021-02-06 LAB — SEDIMENTATION RATE: Sed Rate: 3 mm/hr (ref 0–22)

## 2021-02-06 MED ORDER — ENOXAPARIN SODIUM 40 MG/0.4ML IJ SOSY
40.0000 mg | PREFILLED_SYRINGE | Freq: Every day | INTRAMUSCULAR | Status: DC
  Administered 2021-02-06 – 2021-02-08 (×3): 40 mg via SUBCUTANEOUS
  Filled 2021-02-06 (×3): qty 0.4

## 2021-02-06 MED ORDER — DIPHENHYDRAMINE HCL 25 MG PO CAPS
25.0000 mg | ORAL_CAPSULE | Freq: Once | ORAL | Status: DC | PRN
  Filled 2021-02-06: qty 1

## 2021-02-06 MED ORDER — LORAZEPAM 2 MG/ML IJ SOLN
2.0000 mg | Freq: Once | INTRAMUSCULAR | Status: AC
Start: 2021-02-06 — End: 2021-02-06
  Administered 2021-02-06: 2 mg via INTRAVENOUS
  Filled 2021-02-06: qty 1

## 2021-02-06 MED ORDER — SODIUM CHLORIDE 0.9% FLUSH: 3.0000 mL | INTRAVENOUS | Status: DC | PRN

## 2021-02-06 MED ORDER — ACETAMINOPHEN 325 MG PO TABS
650.0000 mg | ORAL_TABLET | ORAL | Status: DC | PRN
  Administered 2021-02-06 – 2021-02-10 (×4): 650 mg via ORAL
  Filled 2021-02-06 (×5): qty 2

## 2021-02-06 MED ORDER — HYDRALAZINE HCL 25 MG PO TABS: 25.0000 mg | ORAL_TABLET | Freq: Four times a day (QID) | ORAL | Status: DC | PRN

## 2021-02-06 MED ORDER — HYDROCHLOROTHIAZIDE 25 MG PO TABS
25.0000 mg | ORAL_TABLET | Freq: Every day | ORAL | Status: DC
  Administered 2021-02-06 – 2021-02-11 (×6): 25 mg via ORAL
  Filled 2021-02-06 (×6): qty 1

## 2021-02-06 MED ORDER — SODIUM CHLORIDE 0.9% FLUSH
3.0000 mL | Freq: Two times a day (BID) | INTRAVENOUS | Status: DC
  Administered 2021-02-06 – 2021-02-11 (×10): 3 mL via INTRAVENOUS

## 2021-02-06 MED ORDER — LORAZEPAM 2 MG/ML IJ SOLN: 2.0000 mg | INTRAMUSCULAR | Status: AC

## 2021-02-06 MED ORDER — ACETAMINOPHEN 650 MG RE SUPP: 650.0000 mg | RECTAL | Status: DC | PRN

## 2021-02-06 MED ORDER — ALBUTEROL SULFATE (2.5 MG/3ML) 0.083% IN NEBU: 2.5000 mg | INHALATION_SOLUTION | RESPIRATORY_TRACT | Status: DC | PRN

## 2021-02-06 MED ORDER — ACETAMINOPHEN 160 MG/5ML PO SOLN: 650.0000 mg | ORAL | Status: DC | PRN

## 2021-02-06 MED ORDER — SODIUM CHLORIDE 0.9 % IV SOLN
1000.0000 mg | Freq: Once | INTRAVENOUS | Status: AC
  Administered 2021-02-06: 1000 mg via INTRAVENOUS
  Filled 2021-02-06 (×2): qty 16

## 2021-02-06 MED ORDER — LEVOTHYROXINE SODIUM 100 MCG PO TABS
100.0000 ug | ORAL_TABLET | Freq: Every day | ORAL | Status: DC
  Administered 2021-02-06: 100 ug via ORAL
  Filled 2021-02-06: qty 1

## 2021-02-06 MED ORDER — FAMOTIDINE 20 MG PO TABS
10.0000 mg | ORAL_TABLET | Freq: Every day | ORAL | Status: DC
  Administered 2021-02-06 – 2021-02-11 (×6): 10 mg via ORAL
  Filled 2021-02-06 (×6): qty 1

## 2021-02-06 MED ORDER — SENNOSIDES-DOCUSATE SODIUM 8.6-50 MG PO TABS: 1.0000 | ORAL_TABLET | Freq: Every evening | ORAL | Status: DC | PRN

## 2021-02-06 MED ORDER — ASPIRIN EC 81 MG PO TBEC
81.0000 mg | DELAYED_RELEASE_TABLET | Freq: Every day | ORAL | Status: DC
  Administered 2021-02-06 – 2021-02-11 (×6): 81 mg via ORAL
  Filled 2021-02-06 (×6): qty 1

## 2021-02-06 MED ORDER — LISINOPRIL 10 MG PO TABS
10.0000 mg | ORAL_TABLET | Freq: Every day | ORAL | Status: DC
  Administered 2021-02-06 – 2021-02-11 (×6): 10 mg via ORAL
  Filled 2021-02-06 (×6): qty 1

## 2021-02-06 MED ORDER — SODIUM CHLORIDE 0.9 % IV SOLN: 250.0000 mL | INTRAVENOUS | Status: DC | PRN

## 2021-02-06 NOTE — ED Notes (Signed)
Patient transported to MRI 

## 2021-02-06 NOTE — Hospital Course (Addendum)
Dot Kittel is 65 y.o. female who presented to [facility] for increased lacrimation and blurred vision from the right eye, found to have ***.  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  Below is her hospital course listed by problem.  Refer to the H&P for additional information.  Blurred Vision in R Eye 2/2 *** Symptoms had started on 11/16.  She had seen an ophthalmologist on 11/18 and was found to have papilledema and referred to the ED.  She did not present to the ED until 11/21 as her symptoms persisted.  In the ED, CT head notable for low density in the anterior limb of the right external capsule likely representing chronic ischemia, additionally with chronic small vessel ischemia moderate in degree.  CBC and BMP without abnormalities.  Neurology was consulted in the ED and recommended MRI brain and orbits with and without contrast.  MRI showed mild flattening of the posterior left globe without acute orbital findings and chronic microvascular ischemic disease.  CTA and CT Venogram without acute intracranial process, no intracranial vessel occlusion or significant stenosis. Neuromyelitis IgG negative, Bartonella antibody panel negative, toxoplasma negative, lyme disease panel was notable for IgG P41 antibody positive. Risk stratification labs notable for LDL 49 and Hgb A1c 5.2 02/06/21. Neurology recommended lumbar puncture, but was unable to complete due to patient's severe scoliosis.  Ophthalmology was consulted and recommended evaluation with CRP and ESR as well as reiterating need for LP, given unable to do LP and the only option is neurosurgery we may need to consider evaluation at tertiary care center with neuro-ophthalmology.***.  Hypertension  Hypertensive  up to 149 systolic on admission.  She is continued on her home medications of hydrochlorothiazide and lisinopril.   Other chronic problems stable.   Follow-up issues Recommend BP recheck; adjust medications as needed

## 2021-02-06 NOTE — ED Notes (Addendum)
Neuro at bedside. Per MD Opyd can hold off on [facility] at this time and will most likely not be able to get the MRI until later today with anesthesia

## 2021-02-06 NOTE — Progress Notes (Signed)
Family Medicine Teaching Service Daily Progress Note Intern Pager: [phone]  Patient name: Ashley Torres Medical record number: [id number] Date of birth: Feb 15, 1956 Age: 65 y.o. Gender: female  Primary Care Provider: Darral Dash, DO Consultants: Neurology Code Status: Full  Pt Overview and Major Events to Date:  11/21 - Patient admitted  Assessment and Plan: Ashley Torres is a 65 y.o. 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   Blurred vision Rt Eye  papilledema  optic neuritis  Patient with congential history of left eye blindness, and has had blurred vision since 11/16 intermittently. Patient saw ophthalmology 11/18, had papilledema and was directed to ED. Patient arrived today, attempted MRI but unable to tolerate despite ativan. CT Scan showed chronic ischemia in right external capsule, with chronic small vessel ischemia periventricular and dep white matter. Patient sensation and strength grossly intact, patient notable for blurry vision in upper visual field. Patient awaiting MRI this afternoon, after sedation with Ativan. -Neurology consulting, appreciate recs -Anesthesia for MRI -Solumedrol 1g IV x 1 -Ativan 2mg  IV for MRI -MRI Brain -MRI Orbit W WO   HTN Blood pressure stable today at 110-130's/40-60's -Hydralazine 25 mg q6hr prn -HCTZ 25 mg daily -Lisinopril 10 mg daily   Chronic diastolic CHF EF 60-65% Last echo 04/26/20 EF 60-65%, diastolic parameters are consistent with grade 1 diastolic dysfunction. No concerns for HF exacerbation at this time.    Hypothyroidism Lab Results  Component Value Date   TSH 0.506 09/21/2020  -Continue synthroid 100 mcg daily  HLD Hx of statin intolerance on Repatha   FEN/GI:  NPO PPx: Lovenox Dispo:Home today. Barriers include awaiting imaging.   Subjective:  Patient laying comfortably in bed with no complaints of pain or discomfort.   Objective: Temp:  [99 F (37.2 C)] 99 F (37.2 C) (11/21 1353) Pulse Rate:  [86-111] 90 (11/22 0630) Resp:  [14-24] 24 (11/22 0630) BP: (107-183)/(48-117) 128/64 (11/22 0630) SpO2:  [92 %-99 %] 95 % (11/22 0630) Physical Exam: General: Well appearing, well nourished, white woman, NASD Cardiovascular: 4/6 systolic murmur, NRG Respiratory: CTABL Abdomen: Soft, NTTP, non-distended Extremities: moving all extremities independently  Laboratory: Recent Labs  Lab 02/05/21 1503 02/06/21 0334  WBC 6.3 6.2  HGB 13.9 13.6  HCT 43.0 41.6  PLT 247 217   Recent Labs  Lab 02/05/21 1503 02/06/21 0334  NA 142 139  K 3.8 3.7  CL 103 103  CO2 29 28  BUN 21 20  CREATININE 0.75 0.72  CALCIUM 9.6 9.4  PROT  --  6.2*  BILITOT  --  0.4  ALKPHOS  --  61  ALT  --  27  AST  --  26  GLUCOSE 123* 115*     Imaging/Diagnostic Tests:   02/08/21, MD 02/06/2021, 7:33 AM PGY-1, [facility] FPTS Intern pager: [phone], text pages welcome

## 2021-02-06 NOTE — H&P (Signed)
History and Physical    Ashley Torres XTK:[id number] DOB: January 24, 1956 DOA: 02/05/2021  PCP: Darral Dash, DO   Patient coming from: Home  Chief Complaint: Blurred vision   HPI: Ashley Torres is a pleasant 65 y.o. 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  Patient reports increased lacrimation and blurred vision from the right eye beginning 01/31/2021, seem to be intermittent initially but constant since 02/02/2021.  She saw ophthalmology on 02/02/2021, had papilledema and was directed to the ED for further evaluation of suspected optic neuritis.  She denies eye pain, headache, any focal numbness or weakness, difficulty with speech or swallowing, fevers, or chills.  She denies any chest pain or palpitations.  ED Course: Upon arrival to the ED, patient is found to be afebrile, saturating mid 90s on room air, tachycardic in the 110s, and with stable blood pressure.  Chemistry panel and CBC were unremarkable and COVID and influenza screening were negative.  MRI was attempted with IV Ativan, but the patient was unable to tolerate the exam despite this.  Neurology was consulted by the ED physician and hospitalists were asked to admit.  Review of Systems:  All other systems reviewed and apart from HPI, are negative.  Past Medical History:  Diagnosis Date   Allergic rhinitis    Anxiety    Aortic stenosis    Aphakia of left eye    Aphakia of left eye 06/29/2014   Bicuspid aortic valve    Cervical prolapse 09/14/2014   Stage 3 cervical prolapse with urge incontinence per Gynecology. Referred to pelvic floor PT and bladder training   Chronic back  pain 2015   Chronic edema    Chronic sinusitis    Deafness congenital    Drusen of right optic disc    Environmental and seasonal allergies    Gastric stress ulcer    GERD (gastroesophageal reflux disease) 1990   H/O aortic coarctation repair    Hyperlipidemia 1997   Hypertension 1997   Hypothyroidism    Nuclear sclerosis of right eye    Obesity    Rubella syndrome, congenital    S/P AVR (aortic valve replacement)    Scoliosis    Uterine prolapse    Vision impairment    Vision loss, left eye     Past Surgical History:  Procedure Laterality Date   AORTIC VALVE REPLACEMENT  2017   breast biopsies     2001, 2002, & 2005    BREAST EXCISIONAL BIOPSY Left    CARDIAC CATHETERIZATION     COARCTATION OF AORTA REPAIR     repaired at age 54   EYE SURGERY  1964   blindness caused by german measles   THYROID SURGERY  1997   benign mass    Social History:   reports that she has never smoked. She has never used smokeless tobacco. She reports that she does not drink alcohol and does not use drugs.  Allergies  Allergen Reactions   Statins     Upset stomach   Atorvastatin Other (See Comments)    Stomach upset    Erythromycin     Pt can not remember ? Maybe swelling and itching   Furosemide    Hydrocodone-Acetaminophen Itching and Nausea And Vomiting  Meloxicam     Nausea, vomiting   Metoprolol Nausea And Vomiting   Montelukast     Increased head aches   Omeprazole     Never took medication but read about "dangerous side effects"   Potassium Chloride    Rosuvastatin     Myalgias/stomach upset    Family History  Problem Relation Age of Onset   Other Mother        killed in gas explosion   Asthma Mother    Heart murmur Father    Arthritis Father    Hyperlipidemia Father    GER disease Father    Arthritis Brother    Sinusitis Brother    Diabetes Paternal Grandmother    Rheumatic fever Paternal Grandmother    Breast cancer Other    Heart disease Neg Hx       Prior to Admission medications   Medication Sig Start Date End Date Taking? Authorizing Provider  acetaminophen (TYLENOL) 500 MG tablet Take 500 mg by mouth daily as needed for moderate pain.     [provider]  albuterol (VENTOLIN HFA) 108 (90 Base) MCG/ACT inhaler INHALE 2 PUFFS INTO THE LUNGS EVERY 4 HOURS AS NEEDED FOR WHEEZING OR SHORTNESS OF BREATH 07/04/20   Melene Plan, MD  albuterol (VENTOLIN HFA) 108 (90 Base) MCG/ACT inhaler INHALE 2 PUFFS INTO THE LUNGS EVERY 4 HOURS AS NEEDED FOR WHEEZING OR SHORTNESS OF BREATH 07/04/20   Melene Plan, MD  Alirocumab (PRALUENT) 75 MG/ML SOAJ Inject 1 pen into the skin every 14 (fourteen) days. 01/24/21   Meriam Sprague, MD  amoxicillin (AMOXIL) 500 MG tablet TAKE 4 TABLETS BY MOUTH 30-60 MINS PRIOR TO DENTAL PROCEDURES 12/22/19   Dyann Kief, PA-C  aspirin EC 81 MG tablet Take 81 mg by mouth daily.    [provider]  Calcium Carb-Cholecalciferol [phone] MG-UNIT CAPS Take 2 tablets by mouth daily.    [provider]  calcium carbonate (TUMS - DOSED IN MG ELEMENTAL CALCIUM) 500 MG chewable tablet Chew 1 tablet by mouth 4 (four) times daily.    [provider]  chlorpheniramine (CHLOR-TRIMETON) 4 MG tablet Take 4 mg by mouth as needed for allergies.    [provider]  famotidine (PEPCID) 10 MG tablet Take 1 tablet (10 mg total) by mouth daily. 12/03/19   Melene Plan, MD  fexofenadine (ALLEGRA) 180 MG tablet Take 180 mg by mouth daily.    [provider]  fluticasone (FLONASE) 50 MCG/ACT nasal spray Place 2 sprays into both nostrils daily.    [provider]  FOLIC ACID PO Take 1 tablet by mouth daily.    [provider]  GLYCERIN-HYPROMELLOSE-PEG 400 OP Apply 1 drop to eye as needed (Dry eyes).    [provider]  hydrochlorothiazide (HYDRODIURIL) 25 MG tablet TAKE 1 TABLET(25 MG) BY MOUTH DAILY 12/21/20   Dameron, Nolberto Hanlon, DO  levothyroxine (SYNTHROID)  100 MCG tablet TAKE 1 TABLET BY MOUTH DAILY BEFORE BREAKFAST 12/19/20   Dameron, Nolberto Hanlon, DO  lisinopril (ZESTRIL) 10 MG tablet TAKE 1 TABLET BY MOUTH EVERY DAY 10/06/20   Dameron, Nolberto Hanlon, DO  meclizine (ANTIVERT) 25 MG tablet Take 1 tablet (25 mg total) by mouth 3 (three) times daily as needed for dizziness. 12/03/19   Melene Plan, MD  Multiple Vitamins-Minerals (MULTIVITAMIN ADULTS 50+ PO) Take by mouth daily.    [provider]  Multiple Vitamins-Minerals (OCUVITE PRESERVISION PO) Take 1 tablet by mouth daily.  [provider]  Pseudoephedrine-APAP-DM (DAYQUIL PO) Take by mouth as needed (allergies).    [provider]  vitamin C (ASCORBIC ACID) 250 MG tablet Take 250 mg by mouth daily.    [provider]  Vitamin E 100 units TABS Take 1 tablet by mouth daily.    [provider]    Physical Exam: Vitals:   02/05/21 2315 02/05/21 2340 02/05/21 2345 02/06/21 0019  BP: (!) 149/64 (!) 151/117    Pulse: 97 100 99   Resp:  (!) 21 (!) 23   Temp:      TempSrc:      SpO2: 95% 96% 97% 97%    Constitutional: NAD, calm  Eyes: PERTLA, lids and conjunctivae normal ENMT: Mucous membranes are moist. Posterior pharynx clear of any exudate or lesions.   Neck: supple, no masses  Respiratory: no wheezing, no crackles. No accessory muscle use.  Cardiovascular: Rate ~100 and regular. No significant JVD. Abdomen: No distension, no tenderness, soft. Bowel sounds active.  Musculoskeletal: no clubbing / cyanosis. No joint deformity upper and lower extremities.   Skin: no significant rashes, lesions, ulcers. Warm, dry, well-perfused. Neurologic: Intact facial strength and sensation. Gross hearing and vision deficit. Sensation to light touch intact, DTR normal. Strength 5/5 in all 4 limbs. Alert and oriented.  Psychiatric: Pleasant. Cooperative.    Labs and Imaging on Admission: I have personally reviewed following labs and imaging studies  CBC: Recent Labs   Lab 02/05/21 1503  WBC 6.3  NEUTROABS 4.1  HGB 13.9  HCT 43.0  MCV 90.5  PLT 247   Basic Metabolic Panel: Recent Labs  Lab 02/05/21 1503  NA 142  K 3.8  CL 103  CO2 29  GLUCOSE 123*  BUN 21  CREATININE 0.75  CALCIUM 9.6   GFR: CrCl cannot be calculated (Unknown ideal weight.). Liver Function Tests: No results for input(s): AST, ALT, ALKPHOS, BILITOT, PROT, ALBUMIN in the last 168 hours. No results for input(s): LIPASE, AMYLASE in the last 168 hours. No results for input(s): AMMONIA in the last 168 hours. Coagulation Profile: No results for input(s): INR, PROTIME in the last 168 hours. Cardiac Enzymes: No results for input(s): CKTOTAL, CKMB, CKMBINDEX, TROPONINI in the last 168 hours. BNP (last 3 results) No results for input(s): PROBNP in the last 8760 hours. HbA1C: No results for input(s): HGBA1C in the last 72 hours. CBG: No results for input(s): GLUCAP in the last 168 hours. Lipid Profile: No results for input(s): CHOL, HDL, LDLCALC, TRIG, CHOLHDL, LDLDIRECT in the last 72 hours. Thyroid Function Tests: No results for input(s): TSH, T4TOTAL, FREET4, T3FREE, THYROIDAB in the last 72 hours. Anemia Panel: No results for input(s): VITAMINB12, FOLATE, FERRITIN, TIBC, IRON, RETICCTPCT in the last 72 hours. Urine analysis:    Component Value Date/Time   COLORURINE YELLOW 02/27/2018 0355   APPEARANCEUR HAZY (A) 02/27/2018 0355   LABSPEC 1.021 02/27/2018 0355   PHURINE 6.0 02/27/2018 0355   GLUCOSEU NEGATIVE 02/27/2018 0355   HGBUR NEGATIVE 02/27/2018 0355   BILIRUBINUR NEGATIVE 02/27/2018 0355   KETONESUR NEGATIVE 02/27/2018 0355   PROTEINUR NEGATIVE 02/27/2018 0355   NITRITE NEGATIVE 02/27/2018 0355   LEUKOCYTESUR TRACE (A) 02/27/2018 0355   Sepsis Labs: (procalcitonin:4,lacticidven:4) ) Recent Results (from the past 240 hour(s))  Resp Panel by RT-PCR (Flu A&B, Covid) Nasopharyngeal Swab     Status: None   Collection Time: 02/05/21  5:40 PM    Specimen: Nasopharyngeal Swab; Nasopharyngeal(NP) swabs in vial transport medium  Result Value Ref  Range Status   SARS Coronavirus 2 by RT PCR NEGATIVE NEGATIVE Final    Comment: (NOTE) SARS-CoV-2 target nucleic acids are NOT DETECTED.  The SARS-CoV-2 RNA is generally detectable in upper respiratory specimens during the acute phase of infection. The lowest concentration of SARS-CoV-2 viral copies this assay can detect is 138 copies/mL. A negative result does not preclude SARS-Cov-2 infection and should not be used as the sole basis for treatment or other patient management decisions. A negative result may occur with  improper specimen collection/handling, submission of specimen other than nasopharyngeal swab, presence of viral mutation(s) within the areas targeted by this assay, and inadequate number of viral copies(<138 copies/mL). A negative result must be combined with clinical observations, patient history, and epidemiological information. The expected result is Negative.  Fact Sheet for Patients:  BloggerCourse.com  Fact Sheet for Healthcare Providers:  SeriousBroker.it  This test is no t yet approved or cleared by the [place] FDA and  has been authorized for detection and/or diagnosis of SARS-CoV-2 by FDA under an Emergency Use Authorization (EUA). This EUA will remain  in effect (meaning this test can be used) for the duration of the COVID-19 declaration under Section 564(b)(1) of the Act, 21 U.S.C.section 360bbb-3(b)(1), unless the authorization is terminated  or revoked sooner.       Influenza A by PCR NEGATIVE NEGATIVE Final   Influenza B by PCR NEGATIVE NEGATIVE Final    Comment: (NOTE) The Xpert Xpress SARS-CoV-2/FLU/RSV plus assay is intended as an aid in the diagnosis of influenza from Nasopharyngeal swab specimens and should not be used as a sole basis for treatment. Nasal washings and aspirates are  unacceptable for Xpert Xpress SARS-CoV-2/FLU/RSV testing.  Fact Sheet for Patients: BloggerCourse.com  Fact Sheet for Healthcare Providers: SeriousBroker.it  This test is not yet approved or cleared by the [place] FDA and has been authorized for detection and/or diagnosis of SARS-CoV-2 by FDA under an Emergency Use Authorization (EUA). This EUA will remain in effect (meaning this test can be used) for the duration of the COVID-19 declaration under Section 564(b)(1) of the Act, 21 U.S.C. section 360bbb-3(b)(1), unless the authorization is terminated or revoked.  Performed at [facility], [address], [place], [place] [address]      Radiological Exams on Admission: CT HEAD WO CONTRAST ( )  Result Date: 02/05/2021 CLINICAL DATA:  Neuro deficit, acute, stroke suspected EXAM: CT HEAD WITHOUT CONTRAST TECHNIQUE: Contiguous axial images were obtained from the base of the skull through the vertex without intravenous contrast. COMPARISON:  None. FINDINGS: Brain: No acute intracranial hemorrhage. Brain volume is normal for age. Periventricular and deep white matter hypodensity is at least moderate in degree. Low-density in the anterior limb of the right external capsule likely represents chronic ischemia, but is technically indeterminate in age. No midline shift, mass effect, or evidence of mass lesion. There is no hydrocephalus. No subdural or extra-axial collection. Vascular: No hyperdense vessel or unexpected calcification. Skull: No fracture or focal lesion. Sinuses/Orbits: No acute finding. Other: None. IMPRESSION: 1. Low-density in the anterior limb of the right external capsule likely represents chronic ischemia, but is technically indeterminate in age. 2. Periventricular and deep white matter hypodensity, moderate in degree, typical of chronic small vessel ischemia. Electronically Signed   By: Narda Rutherford M.D.   On:  02/05/2021 22:44     Assessment/Plan   1. Blurred vision; ?optic neuritis  - Pt with congenital Lt eye blindness p/w blurred vision involving right eye since  11/16, saw ophthalmology 11/18, had papilledema and was directed to ED  - Unable to tolerate MRI despite IV Ativan  - Neurology consulting and much appreciated, will follow-up on recommendations   2. Hypertension  - BP elevated in ED, anxiety likely contributing, improved after Ativan for MRI  - Continue lisinopril and HCTZ   3. Chronic diastolic CHF  - Appears compensated, EF was 60-65% in Feb 2022  - Continue lisinopril    Video interpreter: Fransisco Beau #[id number]  DVT prophylaxis: Lovenox   Code Status: Full  Level of Care: Level of care: Telemetry Medical Family Communication: None present  Disposition Plan:  Patient is from: Home  Anticipated d/c is to: Home  Anticipated d/c date is: Possibly as early as 02/06/21  Patient currently: pending neurology consultation, MRI  Consults called: Neurology  Admission status: Observation     Briscoe Deutscher, MD [facility]  02/06/2021, 1:45 AM

## 2021-02-06 NOTE — ED Notes (Signed)
Pt requested allergy medicine and tylenol - MD notified - Benadryl ordered - when this RN went to administer meds pt decided she did not want them at that time

## 2021-02-06 NOTE — ED Notes (Signed)
Pt ambulatory to restroom

## 2021-02-06 NOTE — Progress Notes (Signed)
Patient arrived in MRI, refusing scan with just ativan. States she wants to try something stronger to put her to sleep but not under GA.

## 2021-02-06 NOTE — ED Notes (Signed)
Md aware that pt did not make it in MRI

## 2021-02-06 NOTE — Progress Notes (Signed)
Spoke with MRI and notified them that patient is now agreeable to try the MRI again with ativan administered 30 min prior. MRI states they do not know when they will be able to get to her, but will call RN to allow ativan to be administered in a timely manner.

## 2021-02-06 NOTE — Consult Note (Signed)
NEUROLOGY CONSULTATION NOTE   Date of service: February 06, 2021 Patient Name: Ashley Torres MRN:  [id number] DOB:  02-20-1956 Reason for consult: "R eye vision deficit" Requesting Provider: Briscoe Deutscher, MD _ _ _   _ __   _ __ _ _  __ __   _ __   __ _  History of Present Illness  Ashley Torres is a 65 y.o. 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  Patient reports that her symptoms initially started on Wednesday 01/31/2021.  She started noticing increased lacrimation and her eye would hurt a little bit with movement.  She noted that her vision was blurry and the blurriness was intermittent and not getting worse for the first 3 days.  She reports that her vision subsequently got worse and so she saw an ophthalmologist on 02/02/2021.  She was noted to have elevation of the optic disc in the right eye consistent with papilledema of the Right optic nerve.  She was directed to come to the ED for further evaluation. Case was discussed with oncall Neurology and recommend MRI brain and orbit. Unfortunately patient is unable to tolerate MRIs despite attempting with Ativan.  She denies any recent fever, no chills, no signs of URI, UTI or gastroenteritis. No prior history of similar episodes.   ROS   Constitutional Denies weight loss, fever and chills.   HEENT Endorses blurred vision but no problems with hearing.   Respiratory Denies SOB and cough.   CV Denies palpitations and CP   GI Denies abdominal pain, nausea, vomiting and diarrhea.   GU Denies dysuria and urinary frequency.   MSK Denies myalgia and joint pain.   Skin Denies rash and pruritus.   Neurological Denies headache and syncope.   Psychiatric Denies recent  changes in mood. Denies anxiety and depression.    Past History   Past Medical History:  Diagnosis Date   Allergic rhinitis    Anxiety    Aortic stenosis    Aphakia of left eye    Aphakia of left eye 06/29/2014   Bicuspid aortic valve    Cervical prolapse 09/14/2014   Stage 3 cervical prolapse with urge incontinence per Gynecology. Referred to pelvic floor PT and bladder training   Chronic back pain 2015   Chronic edema    Chronic sinusitis    Deafness congenital    Drusen of right optic disc    Environmental and seasonal allergies    Gastric stress ulcer    GERD (gastroesophageal reflux disease) 1990   H/O aortic coarctation repair    Hyperlipidemia 1997   Hypertension 1997   Hypothyroidism    Nuclear sclerosis of right eye    Obesity    Rubella syndrome, congenital    S/P AVR (aortic valve replacement)    Scoliosis    Uterine prolapse    Vision impairment    Vision loss, left eye    Past Surgical History:  Procedure Laterality Date   AORTIC VALVE REPLACEMENT  2017   breast biopsies     2001, 2002, & 2005    BREAST EXCISIONAL BIOPSY Left    CARDIAC CATHETERIZATION     COARCTATION OF AORTA REPAIR     repaired at age 67   EYE SURGERY  1964  blindness caused by german measles   THYROID SURGERY  1997   benign mass   Family History  Problem Relation Age of Onset   Other Mother        killed in gas explosion   Asthma Mother    Heart murmur Father    Arthritis Father    Hyperlipidemia Father    GER disease Father    Arthritis Brother    Sinusitis Brother    Diabetes Paternal Grandmother    Rheumatic fever Paternal Grandmother    Breast cancer Other    Heart disease Neg Hx    Social History   Socioeconomic History   Marital status: Single    Spouse name: Not on file   Number of children: Not on file   Years of education: some college   Highest education level: Not on file  Occupational History    Comment: unemployed  Tobacco Use   Smoking status:  Never   Smokeless tobacco: Never  Vaping Use   Vaping Use: Never used  Substance and Sexual Activity   Alcohol use: Never   Drug use: Never   Sexual activity: Never  Other Topics Concern   Not on file  Social History Narrative   Lives alone   Right handed   Caffeine: chocolate only, not often   Social Determinants of Corporate investment banker Strain: Not on file  Food Insecurity: Not on file  Transportation Needs: Not on file  Physical Activity: Not on file  Stress: Not on file  Social Connections: Not on file   Allergies  Allergen Reactions   Statins     Upset stomach   Atorvastatin Other (See Comments)    Stomach upset    Erythromycin     Pt can not remember ? Maybe swelling and itching   Furosemide    Hydrocodone-Acetaminophen Itching and Nausea And Vomiting   Meloxicam     Nausea, vomiting   Metoprolol Nausea And Vomiting   Montelukast     Increased head aches   Omeprazole     Never took medication but read about "dangerous side effects"   Potassium Chloride    Rosuvastatin     Myalgias/stomach upset    Medications  (Not in a hospital admission)    Vitals   Vitals:   02/06/21 0019 02/06/21 0303 02/06/21 0400 02/06/21 0415  BP:  (!) 126/48 (!) 115/59 119/65  Pulse:  87 92 94  Resp:  20 (!) 21 19  Temp:      TempSrc:      SpO2: 97% 95% 92% 95%     There is no height or weight on file to calculate BMI.  Physical Exam   General: Laying comfortably in bed; in no acute distress.  HENT: Normal oropharynx and mucosa. Normal external appearance of ears and nose.  Neck: Supple, no pain or tenderness  CV: No JVD. No peripheral edema.  Pulmonary: Symmetric Chest rise. Normal respiratory effort.  Abdomen: Soft to touch, non-tender.  Ext: No cyanosis, edema, or deformity  Skin: No rash. Normal palpation of skin.   Musculoskeletal: Normal digits and nails by inspection. No clubbing.   Neurologic Examination  Mental status/Cognition: Alert, oriented  to self, place, month and year, good attention.  Speech/language: Fluent, comprehension intact, object naming intact, repetition intact.  Cranial nerves:   CN II L eye blindness that is chronic. R pupil is 76mm, round and reactive to light. Visual field deficit in the R superior monocular hemi-visual  field deficit.   CN III,IV,VI EOM intact, no gaze preference or deviation, no nystagmus   CN V normal sensation in V1, V2, and V3 segments bilaterally   CN VII no asymmetry, no nasolabial fold flattening    CN VIII normal hearing to speech    CN IX & X normal palatal elevation, no uvular deviation    CN XI 5/5 head turn and 5/5 shoulder shrug bilaterally    CN XII midline tongue protrusion    Motor:  Muscle bulk: normal, tone normal, pronator drift none tremor none Mvmt Root Nerve  Muscle Right Left Comments  SA C5/6 Ax Deltoid 5 5   EF C5/6 Mc Biceps 5 5   EE C6/7/8 Rad Triceps 5 5   WF C6/7 Med FCR     WE C7/8 PIN ECU     F Ab C8/T1 U ADM/FDI 5 5   HF L1/2/3 Fem Illopsoas 5 5   KE L2/3/4 Fem Quad 5 5   DF L4/5 D Peron Tib Ant 5 5   PF S1/2 Tibial Grc/Sol 5 5    Reflexes:  Right Left Comments  Pectoralis      Biceps (C5/6) 2 2   Brachioradialis (C5/6) 2 2    Triceps (C6/7) 2 2    Patellar (L3/4) 2 2    Achilles (S1)      Hoffman      Plantar     Jaw jerk    Sensation:  Light touch Intact throughout   Pin prick    Temperature    Vibration   Proprioception    Coordination/Complex Motor:  - Finger to Nose intact BL - Heel to shin intact BL - Rapid alternating movement are normal - Gait: Stride length short. Arm swing poor. Base width narrow.  Labs   CBC:  Recent Labs  Lab 02/05/21 1503 02/06/21 0334  WBC 6.3 6.2  NEUTROABS 4.1  --   HGB 13.9 13.6  HCT 43.0 41.6  MCV 90.5 90.8  PLT 247 217    Basic Metabolic Panel:  Lab Results  Component Value Date   NA 139 02/06/2021   K 3.7 02/06/2021   CO2 28 02/06/2021   GLUCOSE 115 (H) 02/06/2021   BUN 20  02/06/2021   CREATININE 0.72 02/06/2021   CALCIUM 9.4 02/06/2021   GFRNONAA >60 02/06/2021   GFRAA 96 04/26/2020   Lipid Panel:  Lab Results  Component Value Date   LDLCALC 74 08/04/2020   HgbA1c:  Lab Results  Component Value Date   HGBA1C 5.2 02/06/2021   Urine Drug Screen:     Component Value Date/Time   LABOPIA NONE DETECTED 02/27/2018 0355   COCAINSCRNUR NONE DETECTED 02/27/2018 0355   LABBENZ NONE DETECTED 02/27/2018 0355   AMPHETMU NONE DETECTED 02/27/2018 0355   THCU NONE DETECTED 02/27/2018 0355   LABBARB NONE DETECTED 02/27/2018 0355    Alcohol Level No results found for: [facility]  CT Head without contrast(Personally reviewed): CTH was negative for a large hypodensity concerning for a large territory infarct or hyperdensity concerning for an ICH. Low-density in the anterior limb of the right external  capsule likely represents chronic ischemia, but is technically indeterminate in age.  MR Orbit w + w/o C Pending.  MRI Brainw + w/o C: Pending.  Impression   Ashley Torres is a 65 y.o. 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 Saw ophthalmologist and diagnosed with R optic neuritis with optic disc edema. Her neurologic examination is notable for R superior monocular hemi-visual field deficit.  No prior diagnosis of MS, no hx of autoimmune disorder. Somewhat odd to have optic neuritis for the first time at her age. Will get workup as below.   Recommendations  - Serum lyme, bartonella, ANA with IFA, NMO panel, RPR, Toxoplasma, B12, folate, HSV PCR from [facility]. - MRI Brain and MRI orbit with and without contrast. Might need under sedation. Will make her NPO. - Will do a one time dose of  IVMP 1G once given the extent of vision deficit and the fact that she is blind in her left eye. Hopefully MRI Brain is completed. Would lean towards continuing solumedrol if the MRI Brain is still pending. - Neurology will continue to follow along. ______________________________________________________________________   Thank you for the opportunity to take part in the care of this patient. If you have any further questions, please contact the neurology consultation attending.  Signed,  Erick Blinks [facility] Pager Number [phone] _ _ _   _ __   _ __ _ _  __ __   _ __   __ _

## 2021-02-07 ENCOUNTER — Observation Stay (HOSPITAL_COMMUNITY): Payer: Medicare HMO | Admitting: Certified Registered Nurse Anesthetist

## 2021-02-07 ENCOUNTER — Encounter (HOSPITAL_COMMUNITY)
Admission: EM | Disposition: A | Discharge status: Other Acute Inpatient Hospital | Payer: Self-pay | Source: Home / Self Care | Attending: Family Medicine

## 2021-02-07 ENCOUNTER — Observation Stay (HOSPITAL_COMMUNITY): Payer: Medicare HMO

## 2021-02-07 ENCOUNTER — Encounter (HOSPITAL_COMMUNITY): Payer: Self-pay | Admitting: Family Medicine

## 2021-02-07 DIAGNOSIS — K219 Gastro-esophageal reflux disease without esophagitis: Secondary | ICD-10-CM | POA: Diagnosis not present

## 2021-02-07 DIAGNOSIS — E039 Hypothyroidism, unspecified: Secondary | ICD-10-CM | POA: Diagnosis not present

## 2021-02-07 DIAGNOSIS — I10 Essential (primary) hypertension: Secondary | ICD-10-CM | POA: Diagnosis not present

## 2021-02-07 DIAGNOSIS — H538 Other visual disturbances: Secondary | ICD-10-CM | POA: Diagnosis not present

## 2021-02-07 DIAGNOSIS — H547 Unspecified visual loss: Secondary | ICD-10-CM | POA: Diagnosis not present

## 2021-02-07 DIAGNOSIS — B948 Sequelae of other specified infectious and parasitic diseases: Secondary | ICD-10-CM | POA: Diagnosis not present

## 2021-02-07 DIAGNOSIS — I5032 Chronic diastolic (congestive) heart failure: Secondary | ICD-10-CM | POA: Diagnosis not present

## 2021-02-07 DIAGNOSIS — H469 Unspecified optic neuritis: Secondary | ICD-10-CM | POA: Diagnosis not present

## 2021-02-07 DIAGNOSIS — H543 Unqualified visual loss, both eyes: Secondary | ICD-10-CM | POA: Diagnosis not present

## 2021-02-07 DIAGNOSIS — H4710 Unspecified papilledema: Secondary | ICD-10-CM | POA: Diagnosis not present

## 2021-02-07 DIAGNOSIS — Z20822 Contact with and (suspected) exposure to covid-19: Secondary | ICD-10-CM | POA: Diagnosis not present

## 2021-02-07 DIAGNOSIS — H9193 Unspecified hearing loss, bilateral: Secondary | ICD-10-CM | POA: Diagnosis not present

## 2021-02-07 DIAGNOSIS — H903 Sensorineural hearing loss, bilateral: Secondary | ICD-10-CM | POA: Diagnosis not present

## 2021-02-07 DIAGNOSIS — Q231 Congenital insufficiency of aortic valve: Secondary | ICD-10-CM | POA: Diagnosis not present

## 2021-02-07 DIAGNOSIS — I110 Hypertensive heart disease with heart failure: Secondary | ICD-10-CM | POA: Diagnosis not present

## 2021-02-07 HISTORY — PX: RADIOLOGY WITH ANESTHESIA: SHX6223

## 2021-02-07 LAB — BARTONELLA ANTIBODY PANEL
B Quintana IgM: NEGATIVE titer
B henselae IgG: NEGATIVE titer
B henselae IgM: NEGATIVE titer
B quintana IgG: NEGATIVE titer

## 2021-02-07 LAB — HSV DNA BY PCR (REFERENCE LAB)
HSV 1 DNA: NEGATIVE
HSV 1 DNA: NEGATIVE
HSV 2 DNA: NEGATIVE
HSV 2 DNA: NEGATIVE

## 2021-02-07 LAB — NEUROMYELITIS OPTICA AUTOAB, IGG: NMO-IgG: <1.5 U/mL (ref 0.0–3.0)

## 2021-02-07 IMAGING — MR MR ORBITS WO/W CM
4 of 6 series · 18 of 48 positions shown · IV contrast (gadavist)
Comparison: None.

CLINICAL DATA: papilladema; Vision loss, monocular

EXAM:
MRI HEAD AND ORBITS WITHOUT AND WITH CONTRAST
TECHNIQUE: Multiplanar, multiecho pulse sequences of the brain and surrounding
structures were obtained without and with intravenous contrast.
Multiplanar, multiecho pulse sequences of the orbits and surrounding
structures were obtained including fat saturation techniques, before
and after intravenous contrast administration.
CONTRAST:  7mL GADAVIST GADOBUTROL 1 MMOL/ML IV SOLN

[Series 8: T2 fat-sat · axial · 3.0mm · 0.35mm/px · z∈[-46,-2]mm · 3 of 19 slices shown (1 of 2)]
[im 4/19]
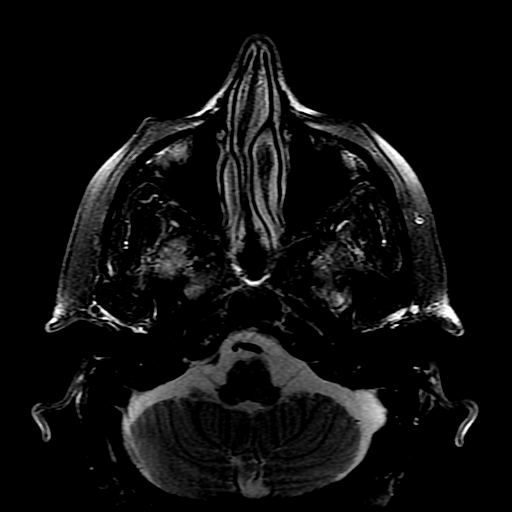
[im 11/19]
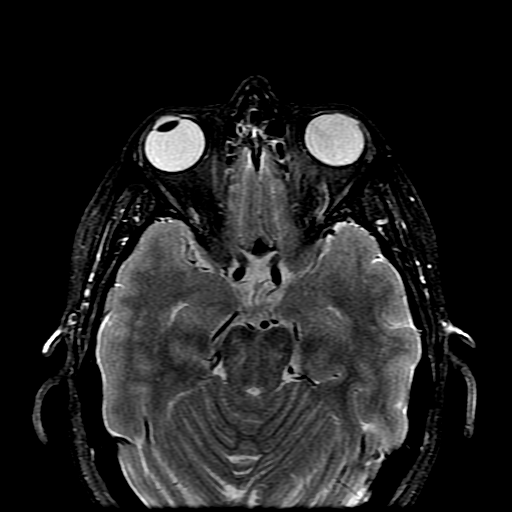
[im 19/19]
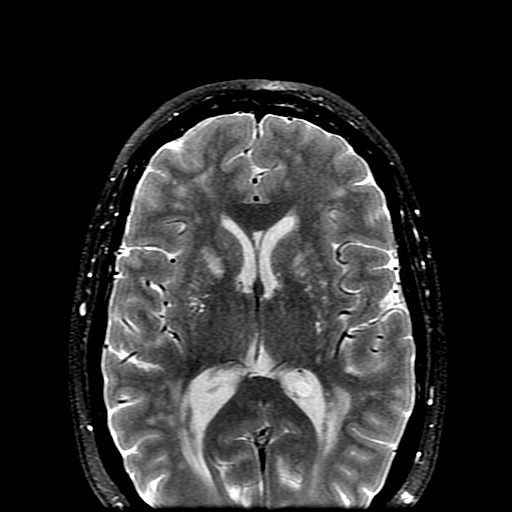

[Series 10: T2 fat-sat · coronal · 3.0mm · 0.35mm/px · 3 of 34 slices shown (2 of 2)]
[im 4/34]
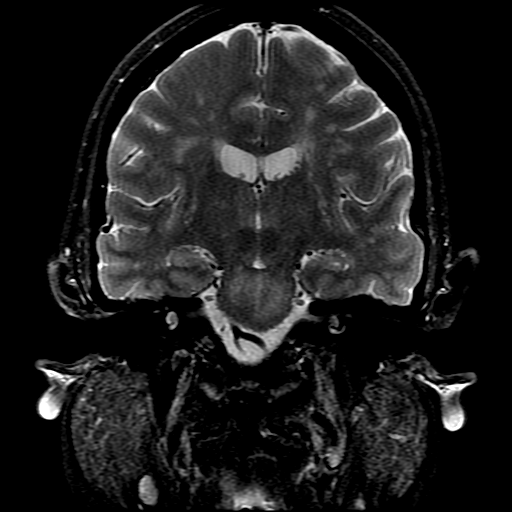
[im 19/34]
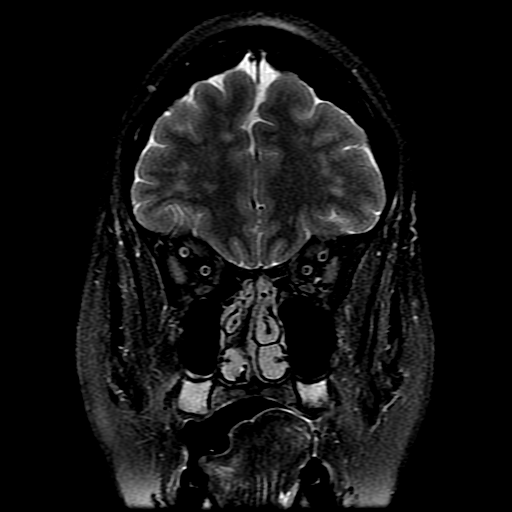
[im 30/34]
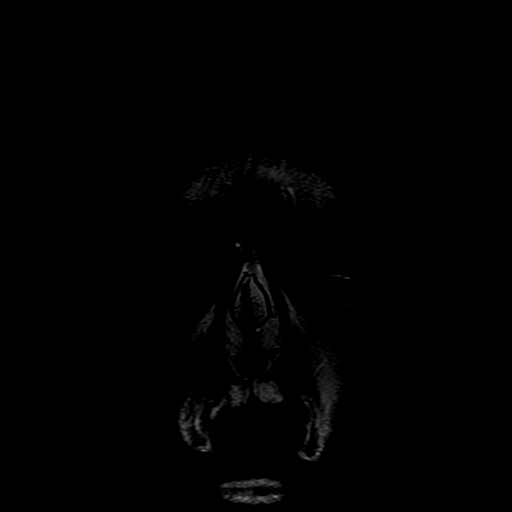

[Series 15: T1 post-contrast · axial · 3.0mm · 0.35mm/px · z∈[-55,-2]mm · 6 of 19 slices shown (1 of 2)]
[im 1/19]
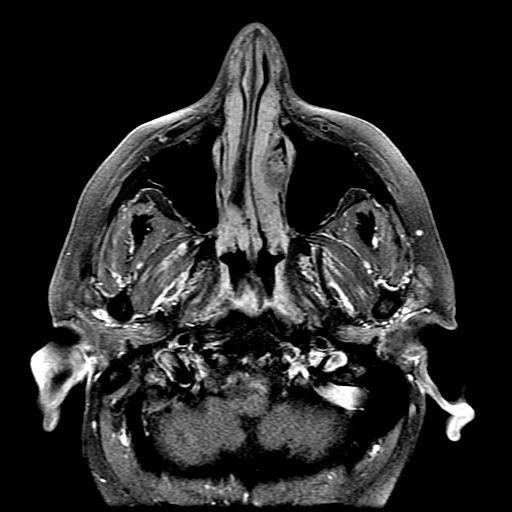
[im 4/19]
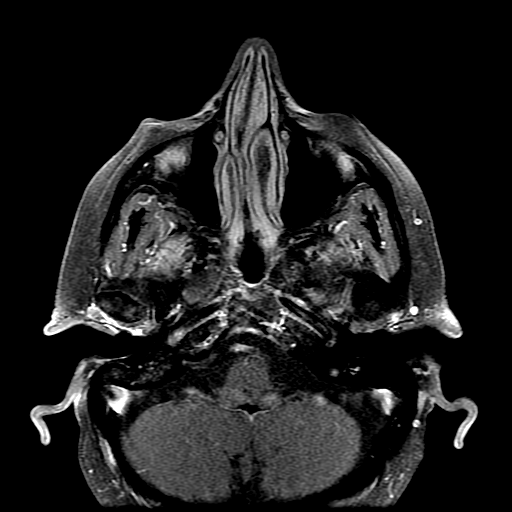
[im 8/19]
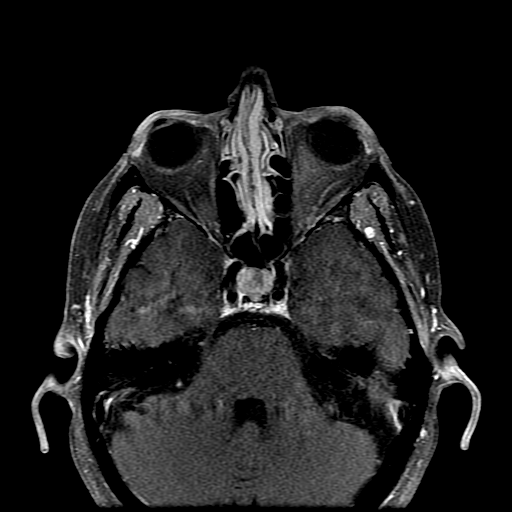
[im 11/19]
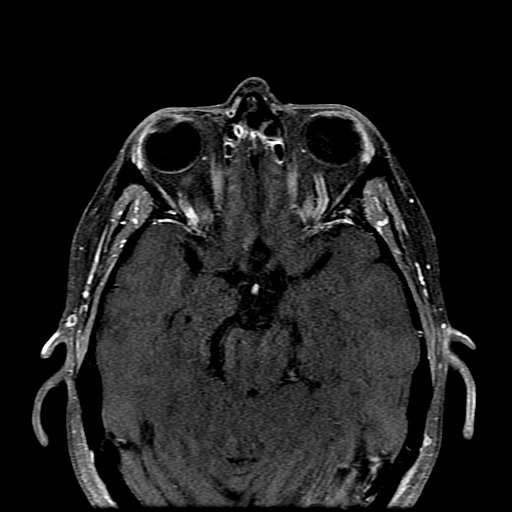
[im 15/19]
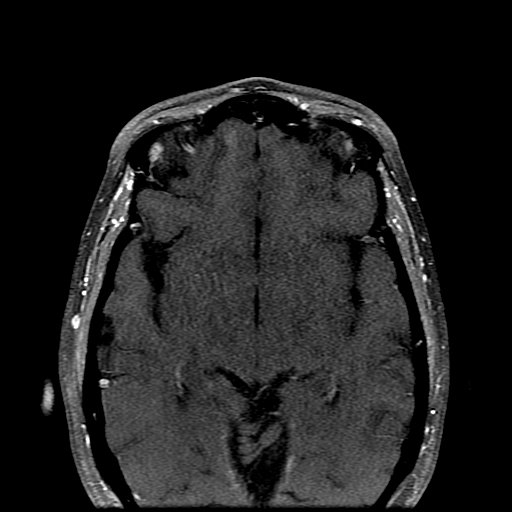
[im 19/19]
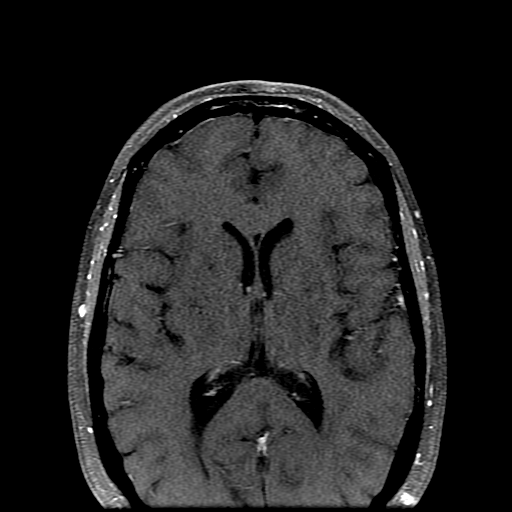

[Series 16: T1 post-contrast · coronal · 3.0mm · 0.35mm/px · 6 of 34 slices shown (2 of 2)]
[im 1/34]
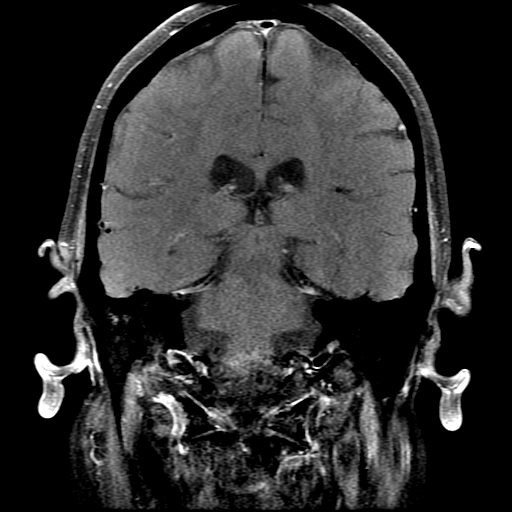
[im 4/34]
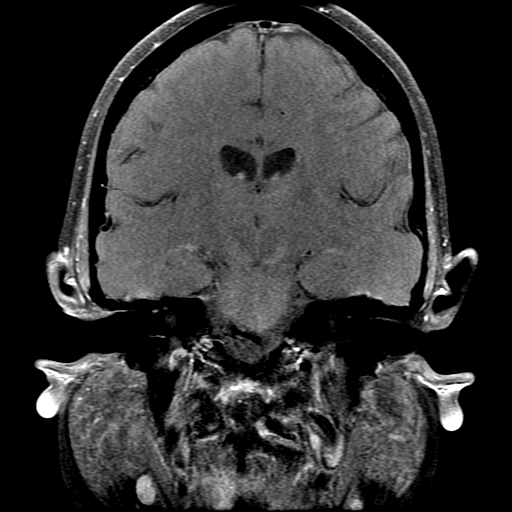
[im 12/34]
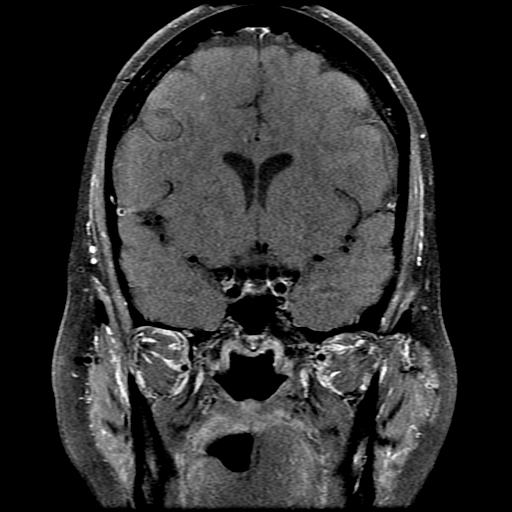
[im 15/34]
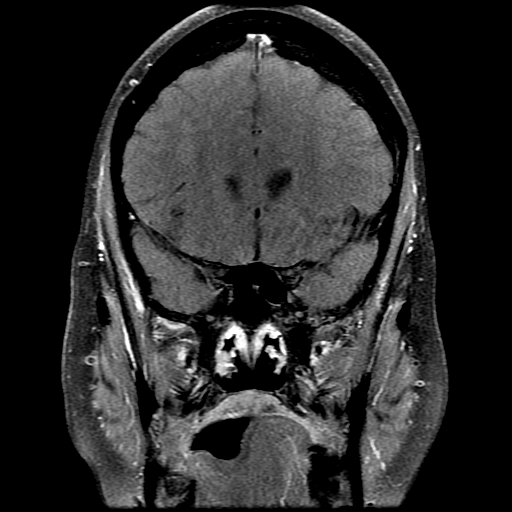
[im 19/34]
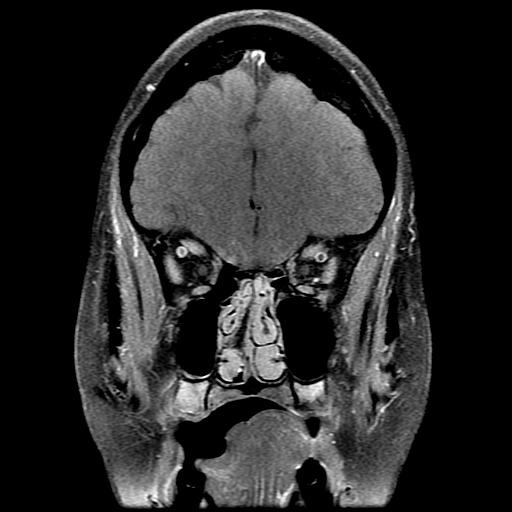
[im 30/34]
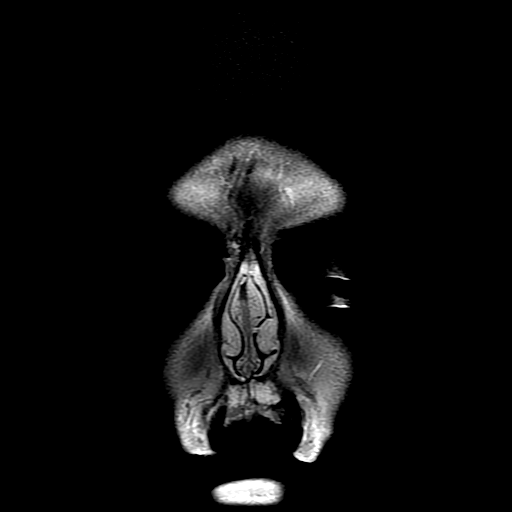

[18 of 48 positions shown; findings below may reference images not displayed]

FINDINGS: MRI HEAD FINDINGS

Brain: No acute infarction, hemorrhage, hydrocephalus, extra-axial
collection or mass lesion. Moderate patchy and confluent T2/FLAIR
hyperintensity in the supratentorial and pontine white matter. No
abnormal enhancement.

Vascular: Major arterial flow voids are maintained at the skull
base.

Skull and upper cervical spine: Normal marrow signal.

Other: No mastoid effusions

MRI ORBITS FINDINGS

Orbits: No traumatic or inflammatory finding. Optic nerves, orbital
fat, extraocular muscles, vascular structures, and lacrimal glands
are normal. Unremarkable right globe. Absent lens and small anterior
chamber on the left. No abnormal enhancement of the optic nerves.

Visualized sinuses: Mild ethmoid air cell and frontal sinus mucosal
thickening otherwise, clear sinuses.

Soft tissues: Negative.
IMPRESSION: MRI head

1. No evidence of acute intracranial abnormality.
2. Moderate T2/FLAIR hyperintensities within the white matter,
nonspecific but compatible with chronic microvascular ischemic
disease. Sequela of chronic demyelination is a differential
consideration.

MRI orbits:

1. Mild flattening of the posterior left globe, possibly related to
reported papilledema. Otherwise, no acute orbital findings.
2. Absent left lens with small left anterior chamber, probably
postsurgical or chronic in this patient with reported chronic left
blindness. Recommend correlation with ophthalmologic history.

## 2021-02-07 IMAGING — MR MR HEAD WO/W CM
9 of 12 series · 33 of 48 positions shown · IV contrast (gadavist)
Comparison: None.

CLINICAL DATA: papilladema; Vision loss, monocular

EXAM:
MRI HEAD AND ORBITS WITHOUT AND WITH CONTRAST
TECHNIQUE: Multiplanar, multiecho pulse sequences of the brain and surrounding
structures were obtained without and with intravenous contrast.
Multiplanar, multiecho pulse sequences of the orbits and surrounding
structures were obtained including fat saturation techniques, before
and after intravenous contrast administration.
CONTRAST:  7mL GADAVIST GADOBUTROL 1 MMOL/ML IV SOLN

[Series 3: DWI · axial · 3.0mm · 1.09mm/px · z∈[-93,+50]mm · 8 of 102 slices shown (1 of 4)]
[im 1/102]
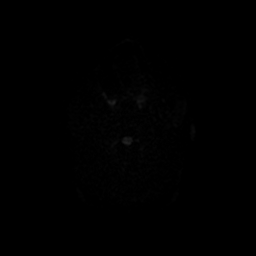
[im 12/102]
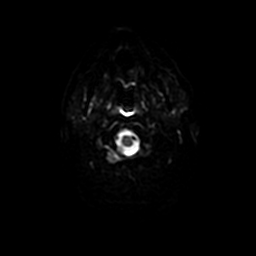
[im 34/102]
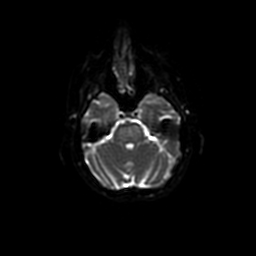
[im 45/102]
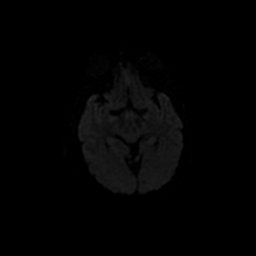
[im 57/102]
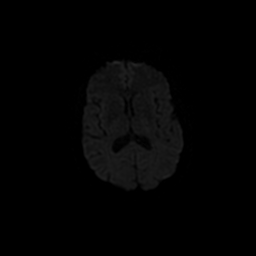
[im 68/102]
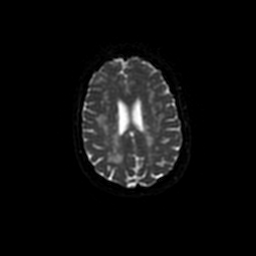
[im 90/102]
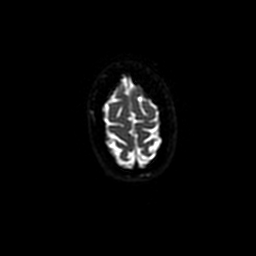
[im 102/102]
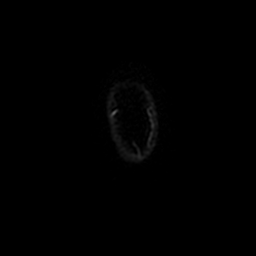

[Series 5: T2 · axial · 5.0mm · 0.43mm/px · z∈[-101,+30]mm · 2 of 24 slices shown]
[im 1/24]
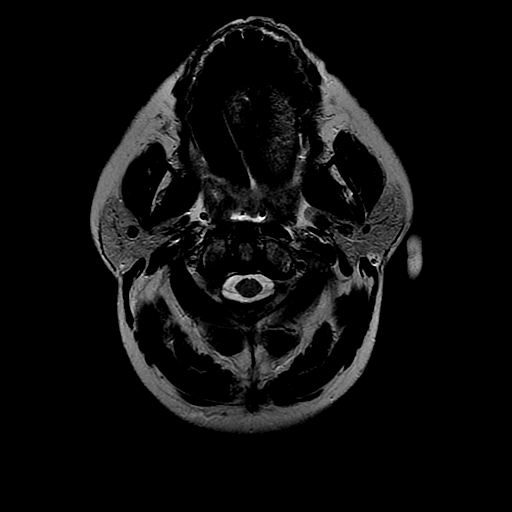
[im 24/24]
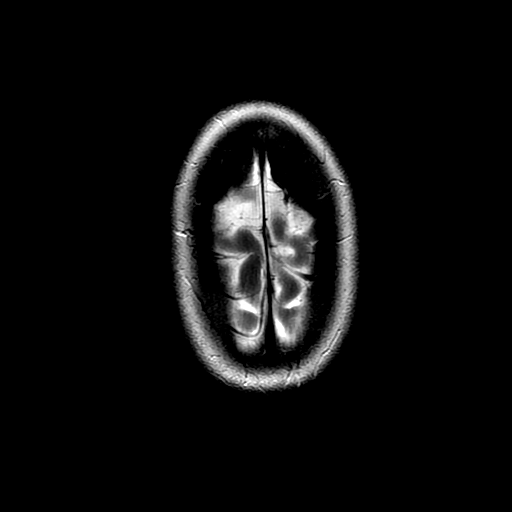

[Series 6: DWI · coronal · 5.0mm · 1.09mm/px · 6 of 70 slices shown (2 of 4)]
[im 1/70]
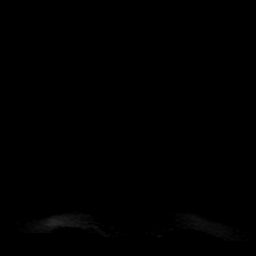
[im 14/70]
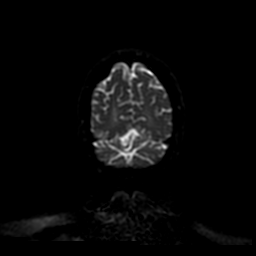
[im 28/70]
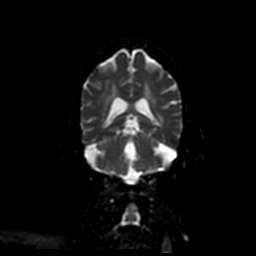
[im 42/70]
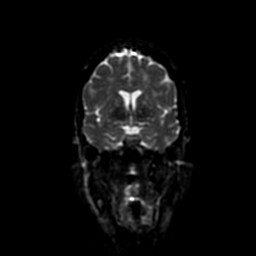
[im 56/70]
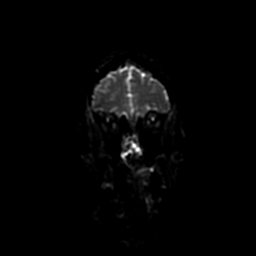
[im 70/70]
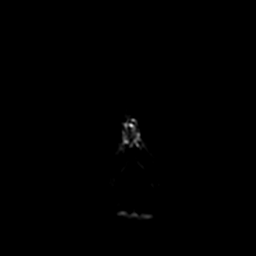

[Series 11: FLAIR · axial · 3.0mm · 0.43mm/px · z∈[-101,+30]mm · 2 of 24 slices shown]
[im 1/24]
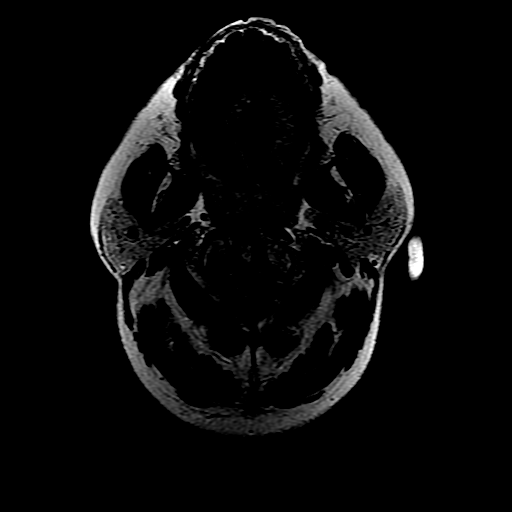
[im 24/24]
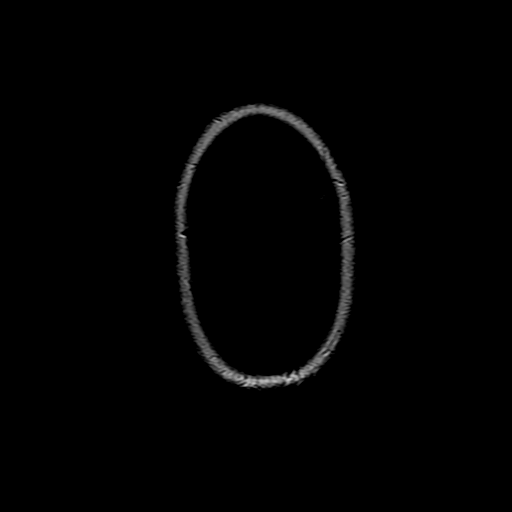

[Series 14: T2 post-contrast · coronal · 5.0mm · 0.39mm/px · 2 of 24 slices shown]
[im 1/24]
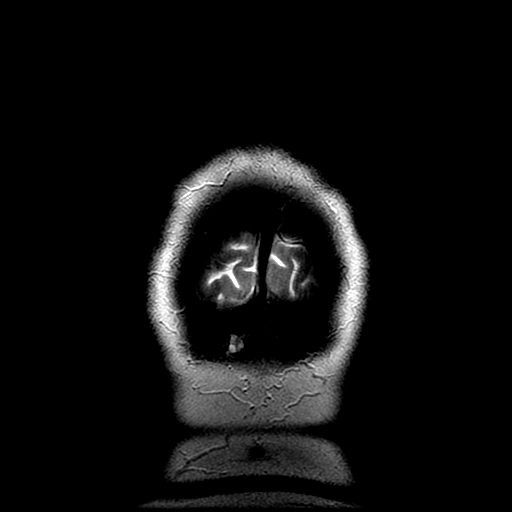
[im 24/24]
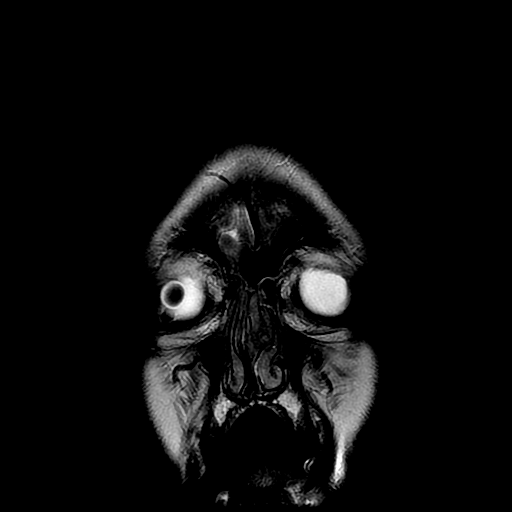

[Series 17: T1 post-contrast · axial · 3.0mm · 0.47mm/px · z∈[-103,+37]mm · 4 of 50 slices shown (1 of 2)]
[im 1/50]
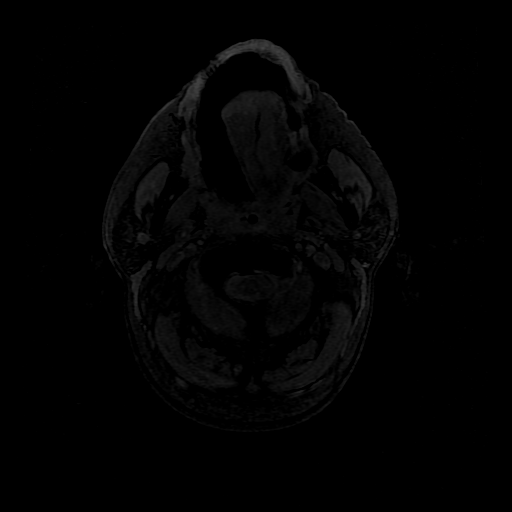
[im 17/50]
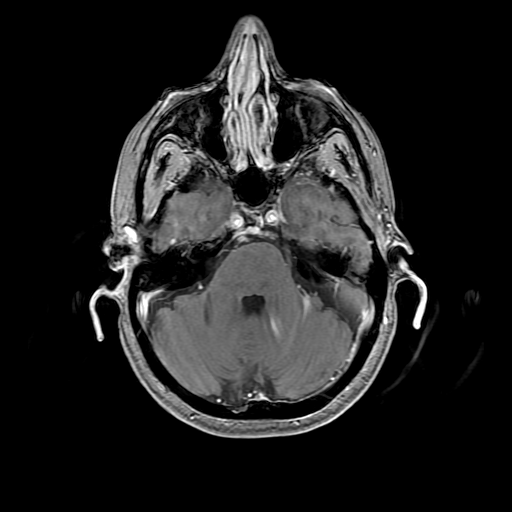
[im 33/50]
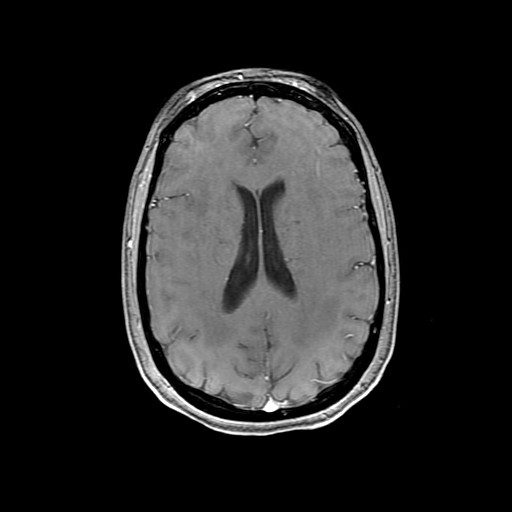
[im 50/50]
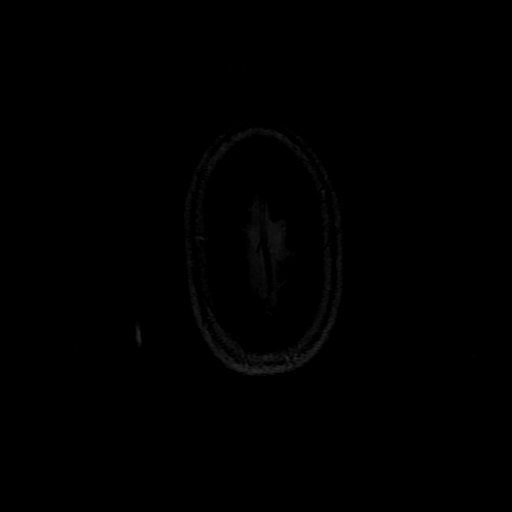

[Series 18: T1 post-contrast · coronal · 5.0mm · 0.39mm/px · 2 of 24 slices shown (2 of 2)]
[im 1/24]
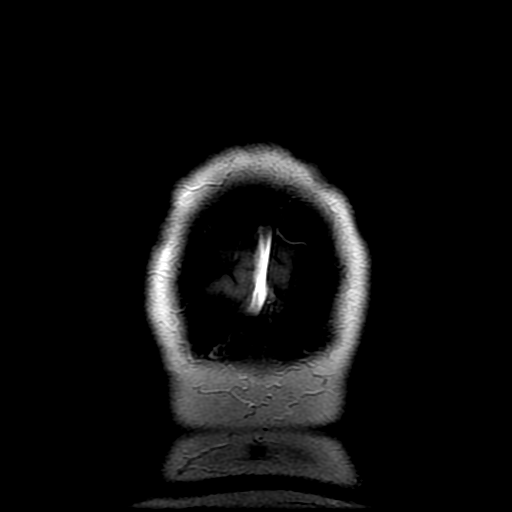
[im 24/24]
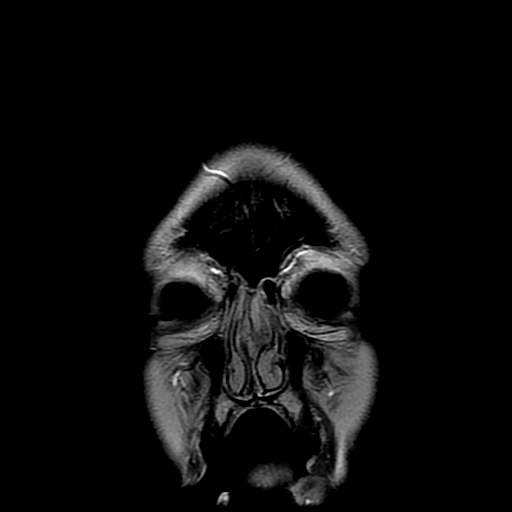

[Series 300: DWI · axial · 3.0mm · 1.09mm/px · z∈[-93,+50]mm · 4 of 51 slices shown (3 of 4)]
[im 1/51]
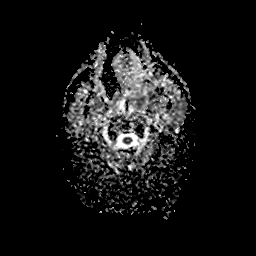
[im 17/51]
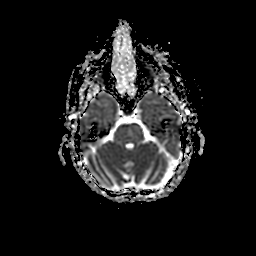
[im 34/51]
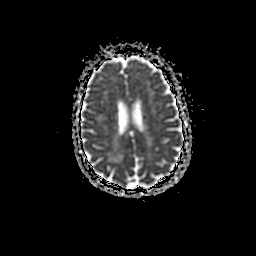
[im 51/51]
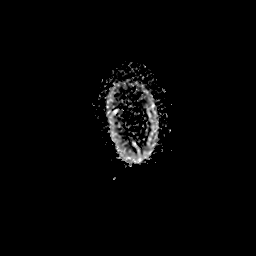

[Series 600: DWI · coronal · 5.0mm · 1.09mm/px · 3 of 35 slices shown (4 of 4)]
[im 1/35]
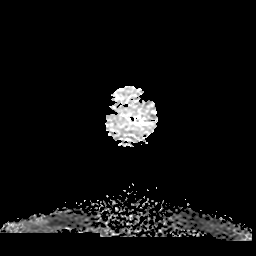
[im 18/35]
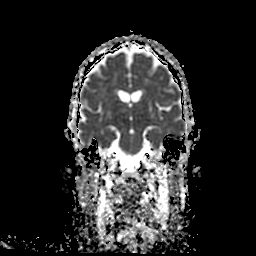
[im 35/35]
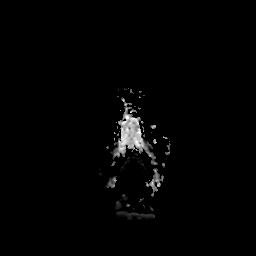

[33 of 48 positions shown; findings below may reference images not displayed]

FINDINGS: MRI HEAD FINDINGS

Brain: No acute infarction, hemorrhage, hydrocephalus, extra-axial
collection or mass lesion. Moderate patchy and confluent T2/FLAIR
hyperintensity in the supratentorial and pontine white matter. No
abnormal enhancement.

Vascular: Major arterial flow voids are maintained at the skull
base.

Skull and upper cervical spine: Normal marrow signal.

Other: No mastoid effusions

MRI ORBITS FINDINGS

Orbits: No traumatic or inflammatory finding. Optic nerves, orbital
fat, extraocular muscles, vascular structures, and lacrimal glands
are normal. Unremarkable right globe. Absent lens and small anterior
chamber on the left. No abnormal enhancement of the optic nerves.

Visualized sinuses: Mild ethmoid air cell and frontal sinus mucosal
thickening otherwise, clear sinuses.

Soft tissues: Negative.
IMPRESSION: MRI head

1. No evidence of acute intracranial abnormality.
2. Moderate T2/FLAIR hyperintensities within the white matter,
nonspecific but compatible with chronic microvascular ischemic
disease. Sequela of chronic demyelination is a differential
consideration.

MRI orbits:

1. Mild flattening of the posterior left globe, possibly related to
reported papilledema. Otherwise, no acute orbital findings.
2. Absent left lens with small left anterior chamber, probably
postsurgical or chronic in this patient with reported chronic left
blindness. Recommend correlation with ophthalmologic history.

## 2021-02-07 IMAGING — CT CT ANGIO HEAD-NECK (W OR W/O PERF)
2 of 11 series · 6 of 36 positions shown · IV contrast (OMNI 350)
Comparison: [DATE] CT head, [DATE] MRI head, no prior CTA
or CT the.

CLINICAL DATA: Right eye blurry vision, papilledema, concern for
increased intracranial pressure, dural venous sinus thrombosis
suspected.

EXAM:
CT ANGIOGRAPHY HEAD AND NECK
CT VENOGRAM HEAD
TECHNIQUE: Multidetector CT imaging of the head and neck was performed using
the standard protocol during bolus administration of intravenous
contrast. Multiplanar CT image reconstructions and MIPs were
obtained to evaluate the vascular anatomy. Carotid stenosis
measurements (when applicable) are obtained utilizing NASCET
criteria, using the distal internal carotid diameter as the
denominator.
Venographic phase images of the brain were obtained following the
administration of intravenous contrast. Multiplanar reformats and
maximum intensity projections were generated.
CONTRAST:  75mL OMNIPAQUE IOHEXOL 350 MG/ML SOLN

[Series 8: cta neck axial · axial · 0.36mm/px · z∈[-215,+0]mm · 5 of 325 slices shown]
[im 55/325  soft-tissue]
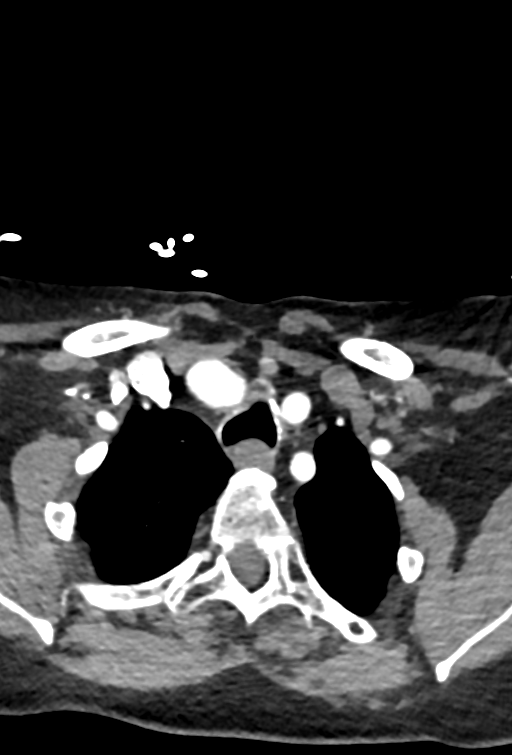
[im 109/325  bone]
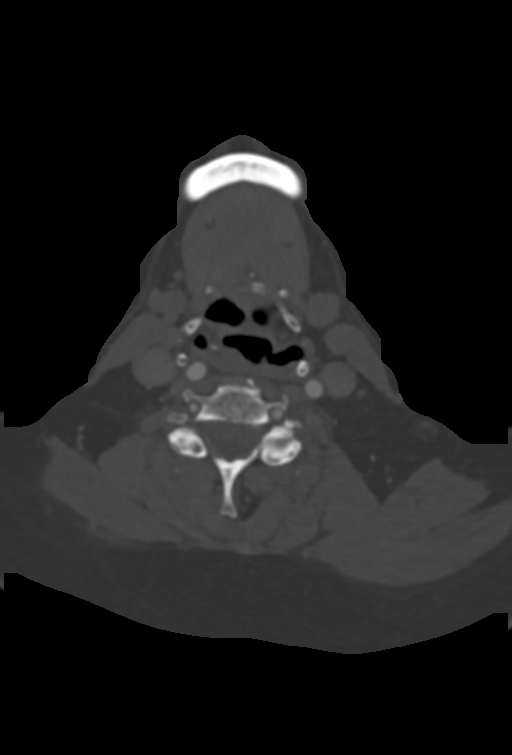
[im 163/325  soft-tissue]
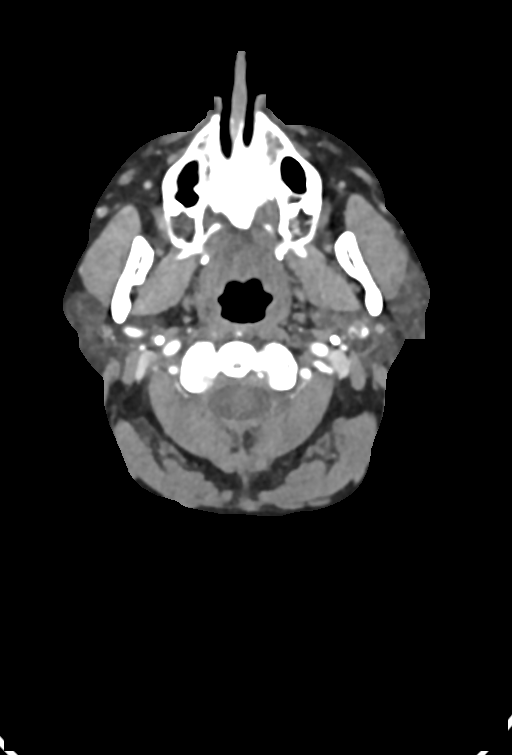
[im 217/325  bone]
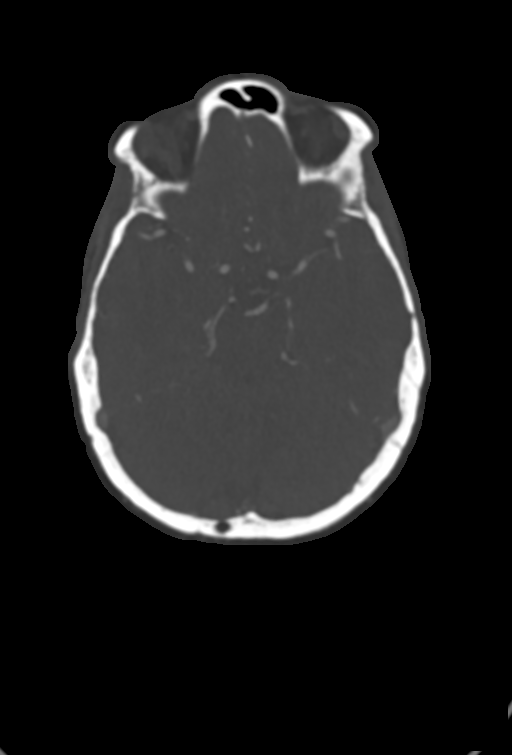
[im 271/325  soft-tissue]
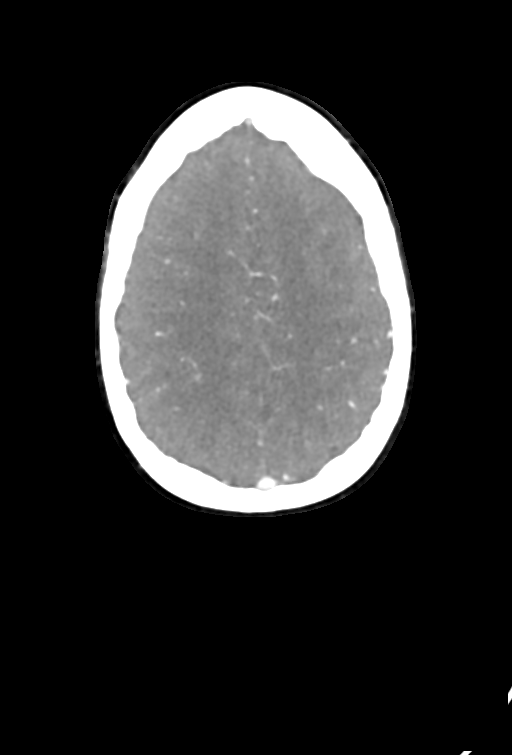

[Series 10: cta neck sagittal · sagittal · 0.54mm/px · 1 of 174 slices shown]
[im 44/174  soft-tissue]
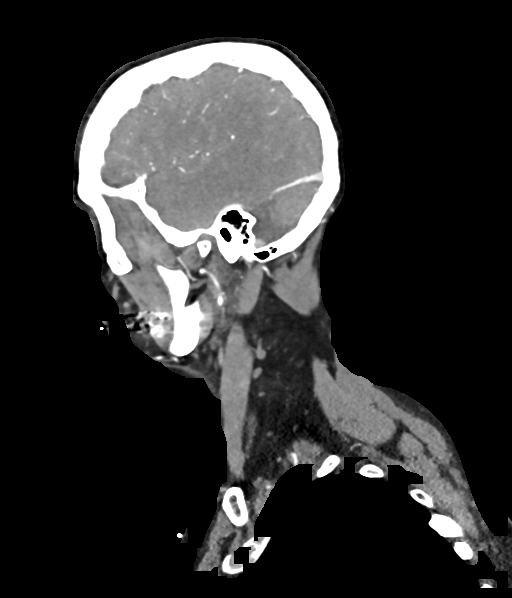

[6 of 36 positions shown; findings below may reference images not displayed]

FINDINGS: CT HEAD FINDINGS

Brain: No acute infarct, hemorrhage, mass, mass effect, or midline
shift. No extra-axial collection or hydrocephalus. Ventricles and
sulci are within normal limits for age. Periventricular white matter
changes, likely the sequela of chronic small vessel ischemic
disease.

Vascular: No hyperdense vessel.

Skull: No acute osseous abnormality.

Sinuses: Imaged portions are clear.

Orbits: Absent left lens, which may be postsurgical. The orbits are
otherwise unremarkable.

Review of the MIP images confirms the above findings

CTA NECK FINDINGS

Aortic arch: Status post coarctation repair, with overall unchanged
appearance of the imaged portions of the aortic arch compared to
[DATE], with narrowing of the distal arch and small superior
outpouching. 2 vessel arch with a common origin of the
brachiocephalic and left common carotid arteries. No evidence of
aneurysm or dissection.

Right carotid system: No evidence of dissection, stenosis (50% or
greater) or occlusion.

Left carotid system: No evidence of dissection, stenosis (50% or
greater) or occlusion.

Vertebral arteries: Left dominant. No evidence of dissection,
stenosis (50% or greater) or occlusion.

Skeleton: Degenerative changes in the cervical spine. No acute
osseous abnormality.

Other neck: Negative

Upper chest: Linear atelectasis and/or scarring. No focal pulmonary
opacity. No pleural effusion.

Review of the MIP images confirms the above findings

CTA HEAD FINDINGS

Anterior circulation: Both internal carotid arteries are patent to
the termini, without stenosis or other abnormality. A1 segments
patent. Normal anterior communicating artery. Anterior cerebral
arteries are patent to their distal aspects. No M1 stenosis or
occlusion. Normal MCA bifurcations. Distal MCA branches perfused and
symmetric.

Posterior circulation: Vertebral arteries patent to the
vertebrobasilar junction without stenosis. Posterior inferior
cerebral arteries patent bilaterally. Basilar patent to its distal
aspect. Superior cerebellar arteries patent bilaterally. PCAs
perfused to their distal aspects without focal stenosis, although
distal branches are diminutive. The posterior communicating arteries
are not definitively visualized.

Venous sinuses: As permitted by contrast timing, patent.

Anatomic variants: None significant

Review of the MIP images confirms the above findings

CT VENOGRAM FINDINGS

No dural venous sinus thrombosis or stenosis.
IMPRESSION: 1. No acute intracranial process.
2. No intracranial large vessel occlusion or significant stenosis.
3. No dural venous sinus thrombosis or stenosis.
4. Partially imaged aortic arch, with unchanged postoperative
appearance of the distal arch, better evaluated on [DATE].

## 2021-02-07 IMAGING — CT CT VENOGRAM HEAD
1 of 7 series · 2 of 35 positions shown · IV contrast (Omni 300)
Comparison: [DATE] CT head, [DATE] MRI head, no prior CTA
or CT the.

CLINICAL DATA: Right eye blurry vision, papilledema, concern for
increased intracranial pressure, dural venous sinus thrombosis
suspected.

EXAM:
CT ANGIOGRAPHY HEAD AND NECK
CT VENOGRAM HEAD
TECHNIQUE: Multidetector CT imaging of the head and neck was performed using
the standard protocol during bolus administration of intravenous
contrast. Multiplanar CT image reconstructions and MIPs were
obtained to evaluate the vascular anatomy. Carotid stenosis
measurements (when applicable) are obtained utilizing NASCET
criteria, using the distal internal carotid diameter as the
denominator.
Venographic phase images of the brain were obtained following the
administration of intravenous contrast. Multiplanar reformats and
maximum intensity projections were generated.
CONTRAST:  75mL OMNIPAQUE IOHEXOL 350 MG/ML SOLN

[Series 3: (person_name) (person_name) · axial · 0.40mm/px · z∈[-47,+5]mm · 2 of 79 slices shown]
[im 27/79  soft-tissue]
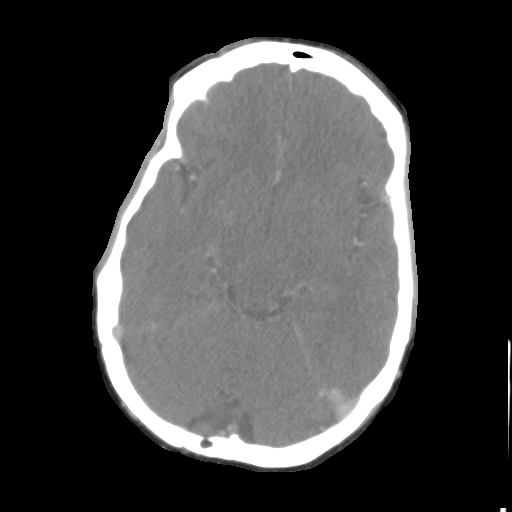
[im 53/79  bone]
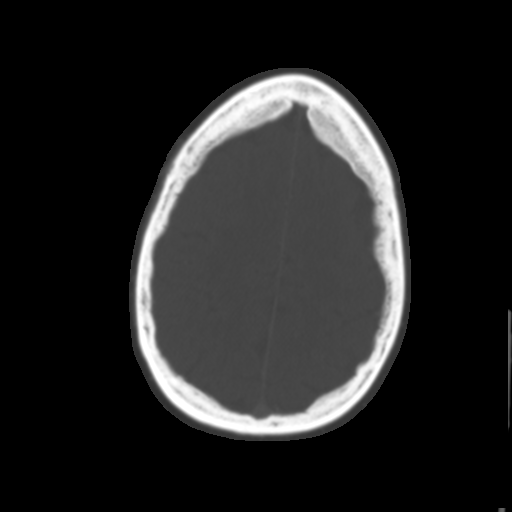

[2 of 35 positions shown; findings below may reference images not displayed]

FINDINGS: CT HEAD FINDINGS

Brain: No acute infarct, hemorrhage, mass, mass effect, or midline
shift. No extra-axial collection or hydrocephalus. Ventricles and
sulci are within normal limits for age. Periventricular white matter
changes, likely the sequela of chronic small vessel ischemic
disease.

Vascular: No hyperdense vessel.

Skull: No acute osseous abnormality.

Sinuses: Imaged portions are clear.

Orbits: Absent left lens, which may be postsurgical. The orbits are
otherwise unremarkable.

Review of the MIP images confirms the above findings

CTA NECK FINDINGS

Aortic arch: Status post coarctation repair, with overall unchanged
appearance of the imaged portions of the aortic arch compared to
[DATE], with narrowing of the distal arch and small superior
outpouching. 2 vessel arch with a common origin of the
brachiocephalic and left common carotid arteries. No evidence of
aneurysm or dissection.

Right carotid system: No evidence of dissection, stenosis (50% or
greater) or occlusion.

Left carotid system: No evidence of dissection, stenosis (50% or
greater) or occlusion.

Vertebral arteries: Left dominant. No evidence of dissection,
stenosis (50% or greater) or occlusion.

Skeleton: Degenerative changes in the cervical spine. No acute
osseous abnormality.

Other neck: Negative

Upper chest: Linear atelectasis and/or scarring. No focal pulmonary
opacity. No pleural effusion.

Review of the MIP images confirms the above findings

CTA HEAD FINDINGS

Anterior circulation: Both internal carotid arteries are patent to
the termini, without stenosis or other abnormality. A1 segments
patent. Normal anterior communicating artery. Anterior cerebral
arteries are patent to their distal aspects. No M1 stenosis or
occlusion. Normal MCA bifurcations. Distal MCA branches perfused and
symmetric.

Posterior circulation: Vertebral arteries patent to the
vertebrobasilar junction without stenosis. Posterior inferior
cerebral arteries patent bilaterally. Basilar patent to its distal
aspect. Superior cerebellar arteries patent bilaterally. PCAs
perfused to their distal aspects without focal stenosis, although
distal branches are diminutive. The posterior communicating arteries
are not definitively visualized.

Venous sinuses: As permitted by contrast timing, patent.

Anatomic variants: None significant

Review of the MIP images confirms the above findings

CT VENOGRAM FINDINGS

No dural venous sinus thrombosis or stenosis.
IMPRESSION: 1. No acute intracranial process.
2. No intracranial large vessel occlusion or significant stenosis.
3. No dural venous sinus thrombosis or stenosis.
4. Partially imaged aortic arch, with unchanged postoperative
appearance of the distal arch, better evaluated on [DATE].

## 2021-02-07 SURGERY — MRI WITH ANESTHESIA
Anesthesia: General

## 2021-02-07 MED ORDER — ROCURONIUM BROMIDE 10 MG/ML (PF) SYRINGE
PREFILLED_SYRINGE | INTRAVENOUS | Status: DC | PRN
  Administered 2021-02-07: 50 mg via INTRAVENOUS
  Administered 2021-02-07: 10 mg via INTRAVENOUS

## 2021-02-07 MED ORDER — SODIUM CHLORIDE 0.9 % IV SOLN
1000.0000 mg | Freq: Once | INTRAVENOUS | Status: AC
  Administered 2021-02-07: 1000 mg via INTRAVENOUS
  Filled 2021-02-07: qty 16

## 2021-02-07 MED ORDER — DEXMEDETOMIDINE (PRECEDEX) IN NS 20 MCG/5ML (4 MCG/ML) IV SYRINGE
PREFILLED_SYRINGE | INTRAVENOUS | Status: DC | PRN
  Administered 2021-02-07: 12 ug via INTRAVENOUS

## 2021-02-07 MED ORDER — MIDAZOLAM HCL 2 MG/2ML IJ SOLN
INTRAMUSCULAR | Status: DC | PRN
  Administered 2021-02-07: 1 mg via INTRAVENOUS
  Administered 2021-02-07: 2 mg via INTRAVENOUS

## 2021-02-07 MED ORDER — CHLORHEXIDINE GLUCONATE 0.12 % MT SOLN
OROMUCOSAL | Status: AC
  Administered 2021-02-07: 15 mL via OROMUCOSAL
  Filled 2021-02-07: qty 15

## 2021-02-07 MED ORDER — CHLORHEXIDINE GLUCONATE 0.12 % MT SOLN: 15.0000 mL | Freq: Once | OROMUCOSAL | Status: AC

## 2021-02-07 MED ORDER — ORAL CARE MOUTH RINSE: 15.0000 mL | Freq: Once | OROMUCOSAL | Status: AC

## 2021-02-07 MED ORDER — SUGAMMADEX SODIUM 200 MG/2ML IV SOLN
INTRAVENOUS | Status: DC | PRN
Start: 2021-02-07 — End: 2021-02-07
  Administered 2021-02-07 (×2): 200 mg via INTRAVENOUS

## 2021-02-07 MED ORDER — GADOBUTROL 1 MMOL/ML IV SOLN
7.0000 mL | Freq: Once | INTRAVENOUS | Status: AC | PRN
  Administered 2021-02-07: 7 mL via INTRAVENOUS

## 2021-02-07 MED ORDER — SODIUM CHLORIDE 0.9 % IV SOLN
1000.0000 mg | Freq: Every day | INTRAVENOUS | Status: AC
  Administered 2021-02-08 – 2021-02-10 (×3): 1000 mg via INTRAVENOUS
  Filled 2021-02-07 (×3): qty 16

## 2021-02-07 MED ORDER — DEXAMETHASONE SODIUM PHOSPHATE 10 MG/ML IJ SOLN
INTRAMUSCULAR | Status: DC | PRN
Start: 2021-02-07 — End: 2021-02-07
  Administered 2021-02-07: 10 mg via INTRAVENOUS

## 2021-02-07 MED ORDER — PROPOFOL 10 MG/ML IV BOLUS
INTRAVENOUS | Status: DC | PRN
  Administered 2021-02-07: 130 mg via INTRAVENOUS

## 2021-02-07 MED ORDER — LACTATED RINGERS IV SOLN: INTRAVENOUS | Status: DC

## 2021-02-07 MED ORDER — FENTANYL CITRATE (PF) 100 MCG/2ML IJ SOLN
INTRAMUSCULAR | Status: DC | PRN
  Administered 2021-02-07: 50 ug via INTRAVENOUS

## 2021-02-07 MED ORDER — SUCCINYLCHOLINE CHLORIDE 200 MG/10ML IV SOSY
PREFILLED_SYRINGE | INTRAVENOUS | Status: DC | PRN
  Administered 2021-02-07: 160 mg via INTRAVENOUS

## 2021-02-07 MED ORDER — IOHEXOL 350 MG/ML SOLN
75.0000 mL | Freq: Once | INTRAVENOUS | Status: AC | PRN
  Administered 2021-02-07: 75 mL via INTRAVENOUS

## 2021-02-07 MED ORDER — ONDANSETRON HCL 4 MG/2ML IJ SOLN
INTRAMUSCULAR | Status: DC | PRN
  Administered 2021-02-07: 4 mg via INTRAVENOUS

## 2021-02-07 MED ORDER — LIDOCAINE 2% (20 MG/ML) 5 ML SYRINGE
INTRAMUSCULAR | Status: DC | PRN
  Administered 2021-02-07: 100 mg via INTRAVENOUS

## 2021-02-07 MED ORDER — LEVOTHYROXINE SODIUM 100 MCG PO TABS
100.0000 ug | ORAL_TABLET | Freq: Every day | ORAL | Status: DC
  Administered 2021-02-08 – 2021-02-11 (×3): 100 ug via ORAL
  Filled 2021-02-07 (×4): qty 1

## 2021-02-07 NOTE — Transfer of Care (Signed)
Immediate Anesthesia Transfer of Care Note  Patient: Ashley Torres  Procedure(s) Performed: MRI WITH ANESTHESIA- BRAIN WITH /WITHOUT  CONSTRAST ORBITS WITH / WITHOUT CONSTRAST  Patient Location: PACU  Anesthesia Type:General  Level of Consciousness: awake, alert  and patient cooperative  Airway & Oxygen Therapy: Patient Spontanous Breathing  Post-op Assessment: Report given to RN and Post -op Vital signs reviewed and stable  Post vital signs: Reviewed and stable  Last Vitals:  Vitals Value Taken Time  BP 99/58 02/07/21 1227  Temp    Pulse 94 02/07/21 1229  Resp 18 02/07/21 1229  SpO2 90 % 02/07/21 1229  Vitals shown include unvalidated device data.  Last Pain:  Vitals:   02/07/21 0937  TempSrc: Oral  PainSc: 0-No pain         Complications: No notable events documented.

## 2021-02-07 NOTE — Anesthesia Preprocedure Evaluation (Addendum)
Anesthesia Evaluation  Patient identified by MRN, date of birth, ID band Patient awake    Reviewed: Allergy & Precautions, NPO status , Patient's Chart, lab work & pertinent test results  Airway Mallampati: III  TM Distance: >3 FB Neck ROM: Full  Mouth opening: Limited Mouth Opening  Dental no notable dental hx. (+) Teeth Intact, Dental Advisory Given   Pulmonary neg pulmonary ROS,    Pulmonary exam normal breath sounds clear to auscultation       Cardiovascular hypertension, Pt. on medications +CHF  Normal cardiovascular exam+ Valvular Problems/Murmurs (s/p AVR)  Rhythm:Regular Rate:Normal  TTE 2022 1. Left ventricular ejection fraction, by estimation, is 60 to 65%. The  left ventricle has normal function. The left ventricle has no regional  wall motion abnormalities. There is mild concentric left ventricular  hypertrophy. Left ventricular diastolic  parameters are consistent with Grade I diastolic dysfunction (impaired  relaxation).  2. Right ventricular systolic function is normal. The right ventricular  size is normal. Tricuspid regurgitation signal is inadequate for assessing  PA pressure.  3. Left atrial size was mildly dilated.  4. The mitral valve is normal in structure. No evidence of mitral valve  regurgitation. No evidence of mitral stenosis.  5. Aortic prosthetic gradients are moderately increased, but are not  changed from 2019 and 2017. This probably reflects mild patient-valve  mismatch, considering the small prosthesis size.. The aortic valve has  been repaired/replaced. Aortic valve  regurgitation is not visualized. Mild to moderate aortic valve stenosis.  There is a 19 mm Carpentier bioprosthetic valve present in the aortic  position.  6. There is systolic turbulence in the proximal descending aorta, but  there is mild residual coarctation (peak gradient 24 mm Hg, mean gradient  13 mm Hg).  7. The  inferior vena cava is dilated in size with <50% respiratory  variability, suggesting right atrial pressure of 15 mmHg.    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, bicuspid aortic valve status post bioprosthetic valve replacement, chronic diastolic CHF, hypertension, hyperlipidemia with statin intolerance on Repatha, hypothyroidism, and BMI 35  Reproductive/Obstetrics                           Anesthesia Physical Anesthesia Plan  ASA: 3  Anesthesia Plan: General   Post-op Pain Management:    Induction: Intravenous  PONV Risk Score and Plan: 3 and Ondansetron, Dexamethasone and Midazolam  Airway Management Planned: Oral ETT  Additional Equipment:   Intra-op Plan:   Post-operative Plan: Extubation in OR  Informed Consent: I have reviewed the patients History and Physical, chart, labs and discussed the procedure including the risks, benefits and alternatives for the proposed anesthesia with the patient or authorized representative who has indicated his/her understanding and acceptance.     Dental advisory given  Plan Discussed with: CRNA  Anesthesia Plan Comments:        Anesthesia Quick Evaluation

## 2021-02-07 NOTE — Progress Notes (Addendum)
Neurology Progress Note  Brief HPI: 65 y.o. female with PMHx of aphakia of her left eye, congenital rubella with deafness and blindness in the left eye, GERD, hypothyroidism, obesity, bicuspid aortic valve s/p biprosthetic valve replacement, chronic diastolic CHF, HLD, HTN who presented to the ED 11/21 for evaluation of right eye blurred vision since Wednesday, 11/16, initially noticing increased lacrimation and eye pain with movement. Her blurred vision was intermittent without progression over 3 days prior to worsening. She saw an ophthalmologist on 11/18 with elevation of the optic disc in the right eye consistent with papilledema of the right optic nerve and she was recommended to come to the ED for MRI brain and orbit imaging. Imaging delayed due to patient's severe claustrophobia.   Subjective: No acute overnight events Patient underwent MRI brain and orbits under general anesthesia this afternoon.   Exam: Vitals:   02/07/21 1227 02/07/21 1241  BP: (!) 99/58 (!) 141/64  Pulse: 94 90  Resp: 17 (!) 26  Temp: 97.9 F (36.6 C)   SpO2: 91% 92%   Gen: Sitting up comfortably in bed, in no acute distress Resp: non-labored breathing, no respiratory distress, SpO2 89% on room air Abd: soft, non-tender, non-distended  Neuro: Mental Status: Awake, alert, and oriented x 4.  Fluently communicates via ASL video interpreter, comprehension is intact.  No neglect is noted. Cranial Nerves: Left eye blindness (chronic), right pupil is round and reactive to light. There is a visual field deficit in the right superior monocular hemi-visual field. EOMI without ptosis, gaze preference, or nystagmus. Face is symmetric resting and with movement. Patient is deaf from birth.  Motor: Spontaneously moves all extremities with antigravity movement 5/5 strength throughout.  Normal tone and bulk.  Sensory:Intact and symmetric to light touch throughout each extremity.  Gait: Deferred  Pertinent Labs: CBC     Component Value Date/Time   WBC 6.2 02/06/2021 0334   RBC 4.58 02/06/2021 0334   HGB 13.6 02/06/2021 0334   HGB 14.6 04/26/2020 1316   HCT 41.6 02/06/2021 0334   HCT 43.1 04/26/2020 1316   PLT 217 02/06/2021 0334   PLT 226 04/26/2020 1316   MCV 90.8 02/06/2021 0334   MCV 87 04/26/2020 1316   MCH 29.7 02/06/2021 0334   MCHC 32.7 02/06/2021 0334   RDW 13.1 02/06/2021 0334   RDW 13.4 04/26/2020 1316   LYMPHSABS 1.4 02/05/2021 1503   MONOABS 0.6 02/05/2021 1503   EOSABS 0.2 02/05/2021 1503   BASOSABS 0.0 02/05/2021 1503   CMP     Component Value Date/Time   NA 139 02/06/2021 0334   NA 139 09/21/2020 1044   K 3.7 02/06/2021 0334   CL 103 02/06/2021 0334   CO2 28 02/06/2021 0334   GLUCOSE 115 (H) 02/06/2021 0334   BUN 20 02/06/2021 0334   BUN 22 09/21/2020 1044   CREATININE 0.72 02/06/2021 0334   CREATININE 0.90 01/12/2016 1544   CALCIUM 9.4 02/06/2021 0334   PROT 6.2 (L) 02/06/2021 0334   PROT 7.1 09/21/2020 1044   ALBUMIN 3.5 02/06/2021 0334   ALBUMIN 4.5 09/21/2020 1044   AST 26 02/06/2021 0334   ALT 27 02/06/2021 0334   ALKPHOS 61 02/06/2021 0334   BILITOT 0.4 02/06/2021 0334   BILITOT 0.3 09/21/2020 1044   GFRNONAA >60 02/06/2021 0334   GFRAA 96 04/26/2020 1316   Lab Results  Component Value Date   VITAMINB12 668 02/06/2021   Lab Results  Component Value Date   FOLATE 51.3 02/06/2021   RPR  11/22: Non Reactive  Pending labs: Serum lyme, bartonella, ANA with IFA, NMO panel, Toxoplasma, HSV PCR from [facility].  Imaging Reviewed:  MRI head + MRI orbits wwo contrast 11/23: MRI head  1. No evidence of acute intracranial abnormality. 2. Moderate T2/FLAIR hyperintensities within the white matter, nonspecific but compatible with chronic microvascular ischemic disease. Sequela of chronic demyelination is a differential consideration.   MRI orbits: 1. Mild flattening of the posterior left globe, possibly related to reported papilledema. Otherwise, no acute  orbital findings. 2. Absent left lens with small left anterior chamber, probably postsurgical or chronic in this patient with reported chronic left blindness. Recommend correlation with ophthalmologic history.  Assessment: 65 y.o. 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 Saw ophthalmologist and diagnosed with R optic neuritis with optic disc edema. Her neurologic examination is notable for R superior monocular hemi-visual field deficit.   No prior diagnosis of MS, no hx of autoimmune disorder, though patient does report a history of transient numbness in one leg without weakness. Somewhat odd to have optic neuritis for the first time at her age with MRI brain imaging without lesions suspicious for MS and without active enhancement. MRI brain is without acute intracranial abnormality with moderate T2/FLAIR hyperintensities within the white matter that is nonspecific but compatible with chronic microvascular ischemic disease. With seeming isolated right papilledema and right blurry vision, will continue to look for causes of increased ICP with CT venogram, CT angio head and neck. If negative will pursue lumbar puncture with opening pressure and CSF labs for inflammatory markers.  Recommendations: - Follow up on pending labs  - CT venogram head to rule out venous thrombosis with CT angio head and neck - If CT venogram is negative, will need lumbar puncture with opening pressure and inflammatory markers - Neurology will continue to follow  Lanae Boast, AGACNP-BC [facility] [phone]  Neurology Attending Attestation   I examined the patient and discussed plan with Ms. Toberman NP. Above note has been edited  by me to reflect my findings and recommendations. I personally reviewed her CNS imaging including MRI brain and orbits wwo contrast. There was no abnormal enhancement of the R optic nerve to indicate optic neuritis although imaging is not 100% sensitive. She has significant confluent white matter disease in the bilateral cerebral hemispheres without discrete lesions suggestive of demyelination and she does not give a history of acquired neurologic deficits transient or otherwise to suggest a longstanding history of MS. Suspect findings are 2/2 combination of chronic small vessel ischemic disease and potentially damage 2/2 congenital rubella (also responsible for her deafness and L eye blindness) although cumulative damage from previous demyelination cannot be ruled out. I suspect her papilledema would have been bilateral but her L optic nerve was previously too damaged to swell. As such we will continue workup for increased ICP with CTV (+ CTA r/o CRAO). If that is negative we will proceed with lumbar puncture. In-house ophthalmology should be consulted tomorrow or Friday to reassess her exam. I suspect the papilledema is reflective of increased ICP and less likely optic neuritis (true papilledema is uncommon in [place]). However, given that she is deaf and already blind in her L eye, losing vision permanently in her R eye would be catastrophic for ability to communicate  and overall quality of life. As such, we will continue high-dose steroids to treat possible inflammatory etiologies while we pursue further workup. I spoke with patient for >30 minutes today with assistance of ASL translator discussing her results and next steps in workup as well as empiric treatment. I also at her request updated her father by phone. Will continue to follow daily.  Bing Neighbors, MD [facility] [phone]  If 7pm- 7am, please page neurology on call as listed in [facility].

## 2021-02-07 NOTE — Progress Notes (Signed)
FPTS Brief Progress Note  S: Elli was seen this PM sleeping comfortably, did not awaken patient.   O: BP 138/61 (BP Location: Right Arm)   Pulse 87   Temp 98.5 F (36.9 C) (Oral)   Resp 20   LMP  (LMP Unknown)   SpO2 91%   Physical Exam Vitals and nursing note reviewed.  Constitutional:      Comments: Asleep comfortably  HENT:     Head: Normocephalic.  Cardiovascular:     Rate and Rhythm: Normal rate and regular rhythm.  Pulmonary:     Effort: Pulmonary effort is normal.     A/P: HTN SBP 130s to 140s, DBP 60s to 80s. HCTZ 25 mg daily Lisinopril 10 mg daily Hydralazine 25 mg every 6 hours as needed for SBP >180 or DBP >100  Hypothyroidism Synthroid 100 mcg daily  HLD History of statin intolerance, on Repatha   - Orders reviewed. Labs for AM ordered, which was adjusted as needed.   Remainder of plan per day team/daily progress note.  Princess Bruins, DO 02/07/2021, 1:51 AM PGY-1, [facility] Night Resident  Please page [phone] with questions.

## 2021-02-07 NOTE — Progress Notes (Signed)
FPTS Brief Progress Note  S: Tawnie was seen this p.m. asleep comfortably.  Did not awaken patient   O: BP 129/63 (BP Location: Right Arm)   Pulse 95   Temp 98.1 F (36.7 C) (Oral)   Resp 19   LMP  (LMP Unknown)   SpO2 95%   Physical Exam Vitals and nursing note reviewed.  Constitutional:      General: She is not in acute distress.    Appearance: Normal appearance. She is not ill-appearing, toxic-appearing or diaphoretic.  HENT:     Head: Normocephalic and atraumatic.  Cardiovascular:     Rate and Rhythm: Normal rate.  Pulmonary:     Effort: Pulmonary effort is normal. No respiratory distress.     A/P: Blurred Vision Rt Eye  papilledema  optic neuritis  CT venogram showed no signs of venous thrombosis or stenosis nor no acute intracranial process. Neurology consulted, appreciate recommendations Consider lumbar puncture with CSF labs and inflammatory markers, per neuro Continue Solu-Medrol 1 g daily x3 additional doses (11/22, 11/23, 11/24-11/27)  Hypothyroidism Missed AM dose due to imaging, we will schedule Synthroid for earlier. Synthroid 100 mcg daily on an empty stomach 2 hours before meals  - Orders reviewed. Labs for AM ordered, which was adjusted as needed.   Remainder of plan per day team/daily progress note.  Princess Bruins, DO 02/07/2021, 11:04 PM PGY-1, [facility] Night Resident  Please page [phone] with questions.

## 2021-02-07 NOTE — Progress Notes (Signed)
Family Medicine Teaching Service Daily Progress Note Intern Pager: [phone]  Patient name: Ashley Torres Medical record number: [id number] Date of birth: 05/23/1955 Age: 65 y.o. Gender: female  Primary Care Provider: Darral Dash, DO Consultants: Neurology Code Status: Full  Pt Overview and Major Events to Date:  11/21 - Patient admitted  Assessment and Plan:  Ashley Torres is a 65 y.o. 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     Blurred Vision Rt Eye  papilledema  optic neuritis  Patient scheduled for MRI under anesthesia. MRIs were negative, with MRI brain showing no evidence of acute intracranial abnormality and chronic microvascular ischemic disease, MRI orbit showed with no acute orbital findings. Neuro exploring causes of right papilledema and blurry vision, considering causes for increased ICP.  -CT Venogram head -CT Angio Head neck W WO  -Consider lumbar puncture with CSF labs -Neurology following, appreciate recs -Continue Solumedrol 1G  HTN Blood pressure stable yesterday 100-140's/50-80's  -Hydralazine 25 mg q6hr prn -HCTZ 25 mg daily -Lisinopril 10 mg daily   Chronic diastolic CHF EF 60-65% No concerns for HF exacerbation at this time.   Hypothyroidism -Continue synthroid 100 mcg daily  HLD Hx of statin intolerance on Repatha   FEN/GI: Clears liquid diet PPx: Lovenox Dispo:Home tomorrow. Barriers include head imaging.   Subjective:  Patient resting comfortably in bed, with no complaints of pain  Objective: Temp:  [97.8 F (36.6 C)-98.5 F (36.9 C)] 97.8 F (36.6 C) (11/23 0320) Pulse Rate:  [86-102] 89 (11/23 0320) Resp:  [19-24] 20 (11/23 0320) BP: (128-149)/(59-84) 131/64 (11/23 0320) SpO2:  [91  %-98 %] 92 % (11/23 0320) Physical Exam: General: Well appearing, well nourished, white woman, NASD Cardiovascular: RRR, 4/6 systolic murmur Respiratory: CTABL Abdomen: Soft, NTTP, non-distended Extremities: Moving all extremities independently Neuro: Strength grossly intact, sensation intact in face x3, hands a feet  Laboratory: Recent Labs  Lab 02/05/21 1503 02/06/21 0334  WBC 6.3 6.2  HGB 13.9 13.6  HCT 43.0 41.6  PLT 247 217   Recent Labs  Lab 02/05/21 1503 02/06/21 0334  NA 142 139  K 3.8 3.7  CL 103 103  CO2 29 28  BUN 21 20  CREATININE 0.75 0.72  CALCIUM 9.6 9.4  PROT  --  6.2*  BILITOT  --  0.4  ALKPHOS  --  61  ALT  --  27  AST  --  26  GLUCOSE 123* 115*      Imaging/Diagnostic Tests: MRI head   1. No evidence of acute intracranial abnormality. 2. Moderate T2/FLAIR hyperintensities within the white matter, nonspecific but compatible with chronic microvascular ischemic disease. Sequela of chronic demyelination is a differential consideration.   MRI orbits:   1. Mild flattening of the posterior left globe, possibly related to reported papilledema. Otherwise, no acute orbital findings. 2. Absent left lens with small left anterior chamber, probably postsurgical or chronic in this patient with reported chronic left blindness. Recommend correlation with ophthalmologic history  Bess Kinds, MD 02/07/2021, 5:50 AM PGY-1, [facility] FPTS Intern pager: [phone], text pages welcome

## 2021-02-07 NOTE — Anesthesia Procedure Notes (Signed)
Procedure Name: Intubation Date/Time: 02/07/2021 11:06 AM Performed by: Drema Pry, CRNA Pre-anesthesia Checklist: Patient identified, Emergency Drugs available, Suction available and Patient being monitored Patient Re-evaluated:Patient Re-evaluated prior to induction Oxygen Delivery Method: Circle system utilized Preoxygenation: Pre-oxygenation with 100% oxygen Induction Type: IV induction Ventilation: Mask ventilation without difficulty Laryngoscope Size: Glidescope and 3 Grade View: Grade I Tube type: Oral Tube size: 7.0 mm Number of attempts: 1 Airway Equipment and Method: Stylet Placement Confirmation: ETT inserted through vocal cords under direct vision, positive ETCO2 and breath sounds checked- equal and bilateral Secured at: 20 cm Tube secured with: Tape Dental Injury: Teeth and Oropharynx as per pre-operative assessment  Comments: MRI - Elective glidescope due to small OA and anterior larynx

## 2021-02-08 ENCOUNTER — Encounter (HOSPITAL_COMMUNITY): Payer: Self-pay | Admitting: Radiology

## 2021-02-08 DIAGNOSIS — Z7982 Long term (current) use of aspirin: Secondary | ICD-10-CM | POA: Diagnosis not present

## 2021-02-08 DIAGNOSIS — E669 Obesity, unspecified: Secondary | ICD-10-CM | POA: Diagnosis present

## 2021-02-08 DIAGNOSIS — E785 Hyperlipidemia, unspecified: Secondary | ICD-10-CM | POA: Diagnosis present

## 2021-02-08 DIAGNOSIS — H538 Other visual disturbances: Secondary | ICD-10-CM | POA: Diagnosis not present

## 2021-02-08 DIAGNOSIS — Z79890 Hormone replacement therapy (postmenopausal): Secondary | ICD-10-CM | POA: Diagnosis not present

## 2021-02-08 DIAGNOSIS — H5440 Blindness, one eye, unspecified eye: Secondary | ICD-10-CM | POA: Diagnosis not present

## 2021-02-08 DIAGNOSIS — H543 Unqualified visual loss, both eyes: Secondary | ICD-10-CM | POA: Diagnosis not present

## 2021-02-08 DIAGNOSIS — Z952 Presence of prosthetic heart valve: Secondary | ICD-10-CM | POA: Diagnosis not present

## 2021-02-08 DIAGNOSIS — H5340 Unspecified visual field defects: Secondary | ICD-10-CM | POA: Diagnosis present

## 2021-02-08 DIAGNOSIS — F40240 Claustrophobia: Secondary | ICD-10-CM | POA: Diagnosis present

## 2021-02-08 DIAGNOSIS — I110 Hypertensive heart disease with heart failure: Secondary | ICD-10-CM | POA: Diagnosis not present

## 2021-02-08 DIAGNOSIS — H9193 Unspecified hearing loss, bilateral: Secondary | ICD-10-CM | POA: Diagnosis not present

## 2021-02-08 DIAGNOSIS — M419 Scoliosis, unspecified: Secondary | ICD-10-CM | POA: Diagnosis present

## 2021-02-08 DIAGNOSIS — H469 Unspecified optic neuritis: Secondary | ICD-10-CM | POA: Diagnosis not present

## 2021-02-08 DIAGNOSIS — I5032 Chronic diastolic (congestive) heart failure: Secondary | ICD-10-CM | POA: Diagnosis not present

## 2021-02-08 DIAGNOSIS — K219 Gastro-esophageal reflux disease without esophagitis: Secondary | ICD-10-CM | POA: Diagnosis present

## 2021-02-08 DIAGNOSIS — H903 Sensorineural hearing loss, bilateral: Secondary | ICD-10-CM | POA: Diagnosis not present

## 2021-02-08 DIAGNOSIS — Z825 Family history of asthma and other chronic lower respiratory diseases: Secondary | ICD-10-CM | POA: Diagnosis not present

## 2021-02-08 DIAGNOSIS — Z79899 Other long term (current) drug therapy: Secondary | ICD-10-CM | POA: Diagnosis not present

## 2021-02-08 DIAGNOSIS — Z20822 Contact with and (suspected) exposure to covid-19: Secondary | ICD-10-CM | POA: Diagnosis not present

## 2021-02-08 DIAGNOSIS — H905 Unspecified sensorineural hearing loss: Secondary | ICD-10-CM | POA: Diagnosis not present

## 2021-02-08 DIAGNOSIS — Z8261 Family history of arthritis: Secondary | ICD-10-CM | POA: Diagnosis not present

## 2021-02-08 DIAGNOSIS — H5462 Unqualified visual loss, left eye, normal vision right eye: Secondary | ICD-10-CM | POA: Diagnosis present

## 2021-02-08 DIAGNOSIS — Z803 Family history of malignant neoplasm of breast: Secondary | ICD-10-CM | POA: Diagnosis not present

## 2021-02-08 DIAGNOSIS — B948 Sequelae of other specified infectious and parasitic diseases: Secondary | ICD-10-CM | POA: Diagnosis not present

## 2021-02-08 DIAGNOSIS — H47011 Ischemic optic neuropathy, right eye: Secondary | ICD-10-CM | POA: Diagnosis not present

## 2021-02-08 DIAGNOSIS — H4710 Unspecified papilledema: Secondary | ICD-10-CM | POA: Diagnosis not present

## 2021-02-08 DIAGNOSIS — E039 Hypothyroidism, unspecified: Secondary | ICD-10-CM | POA: Diagnosis present

## 2021-02-08 DIAGNOSIS — Q231 Congenital insufficiency of aortic valve: Secondary | ICD-10-CM | POA: Diagnosis not present

## 2021-02-08 LAB — TOXOPLASMA GONDII, PCR: Toxoplasma Gondii, PCR: NEGATIVE

## 2021-02-08 MED ORDER — SENNOSIDES-DOCUSATE SODIUM 8.6-50 MG PO TABS
1.0000 | ORAL_TABLET | Freq: Every day | ORAL | Status: DC
  Administered 2021-02-08 – 2021-02-11 (×4): 1 via ORAL
  Filled 2021-02-08 (×4): qty 1

## 2021-02-08 MED ORDER — POLYETHYLENE GLYCOL 3350 17 G PO PACK
17.0000 g | PACK | Freq: Every day | ORAL | Status: DC
  Administered 2021-02-09 – 2021-02-11 (×3): 17 g via ORAL
  Filled 2021-02-08 (×3): qty 1

## 2021-02-08 MED ORDER — MENTHOL 3 MG MT LOZG
1.0000 | LOZENGE | OROMUCOSAL | Status: DC | PRN
  Filled 2021-02-08: qty 9

## 2021-02-08 MED ORDER — LORAZEPAM 2 MG/ML IJ SOLN
2.0000 mg | Freq: Once | INTRAMUSCULAR | Status: AC
  Administered 2021-02-08: 2 mg via INTRAVENOUS
  Filled 2021-02-08: qty 1

## 2021-02-08 NOTE — Progress Notes (Signed)
Family Medicine Teaching Service Daily Progress Note Intern Pager: [phone]  Patient name: Ashley Torres Medical record number: [id number] Date of birth: January 30, 1956 Age: 65 y.o. Gender: female  Primary Care Provider: Darral Dash, DO Consultants: Neurology Code Status: Full  Pt Overview and Major Events to Date:  11/21 - Patient admitted  Assessment and Plan: Ashley Torres is a 65 y.o. 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   Blurred Vision Rt Eye  papilledema  optic neuritis  MRI brain without acute intracranial abnormality and nonspecific hyperintensities.  CT venogram showed no no acute process or large vessel occlusion/stenosis.  Per neurology, patient will need lumbar puncture with opening pressure inflammatory markers. Also will consider an opthalmology consult. -Consider consulting ophthalmology -Lumbar puncture with CSF labs today -Neurology following, appreciate recs -Continue Solumedrol 1G   HTN BP overall stable, most recently 124/88 -Hydralazine 25 mg q6hr prn for BP > 180/100 -HCTZ 25 mg daily -Lisinopril 10 mg daily    Hypothyroidism -Continue synthroid 100 mcg daily   FEN/GI: Clear liquid diet PPx: Lovenox Dispo:Home in 2-3 days. Barriers include further medical work-up.   Subjective:  ASL interpreter was used.  Neurology team was in the room explaining the lumbar puncture procedure and our treatment plans at this time.  Patient reports she is not having any pain in her chest or otherwise.  She does note a little bit of discomfort in her throat but thinks it is secondary to her recent intubation for her MRI.  Objective: Temp:  [97.7 F (36.5 C)-98.8 F (37.1 C)] 97.8 F (36.6 C)  (11/24 0821) Pulse Rate:  [82-101] 90 (11/24 0821) Resp:  [12-26] 21 (11/24 0821) BP: (99-141)/(47-88) 124/88 (11/24 0821) SpO2:  [91 %-95 %] 92 % (11/24 9030) Physical Exam: General: Awake, alert, oriented x4, able to communicate via ASL video interpreter Cardiovascular: 2+ radial pulses, appears warm and well-perfused, regular rate and rhythm on telemetry Respiratory: Breathing comfortably on room air, no increased work of breathing, O2 saturations in the 90s on telemetry Abdomen: Soft, nontender, does not appear distended Extremities: Moving all extremities equally and appropriately, warm and well perfused  Laboratory: Recent Labs  Lab 02/05/21 1503 02/06/21 0334  WBC 6.3 6.2  HGB 13.9 13.6  HCT 43.0 41.6  PLT 247 217   Recent Labs  Lab 02/05/21 1503 02/06/21 0334  NA 142 139  K 3.8 3.7  CL 103 103  CO2 29 28  BUN 21 20  CREATININE 0.75 0.72  CALCIUM 9.6 9.4  PROT  --  6.2*  BILITOT  --  0.4  ALKPHOS  --  61  ALT  --  27  AST  --  26  GLUCOSE 123* 115*     Imaging/Diagnostic Tests: CT ANGIO HEAD NECK W WO CM  Result Date: 02/07/2021 CLINICAL DATA:  Right eye blurry vision, papilledema, concern for increased intracranial pressure, dural venous sinus thrombosis suspected. EXAM: CT ANGIOGRAPHY HEAD AND NECK CT VENOGRAM HEAD TECHNIQUE: Multidetector CT imaging of the head and neck was performed using the standard protocol during bolus administration of intravenous contrast. Multiplanar CT image reconstructions and MIPs were obtained to evaluate the vascular anatomy. Carotid stenosis measurements (when applicable) are obtained utilizing NASCET criteria, using the distal internal carotid diameter as the denominator. Venographic phase images of the brain  were obtained following the administration of intravenous contrast. Multiplanar reformats and maximum intensity projections were generated. CONTRAST:  70mL OMNIPAQUE IOHEXOL 350 MG/ML SOLN COMPARISON:  02/05/2021 CT head,  02/07/2021 MRI head, no prior CTA or CT the. FINDINGS: CT HEAD FINDINGS Brain: No acute infarct, hemorrhage, mass, mass effect, or midline shift. No extra-axial collection or hydrocephalus. Ventricles and sulci are within normal limits for age. Periventricular white matter changes, likely the sequela of chronic small vessel ischemic disease. Vascular: No hyperdense vessel. Skull: No acute osseous abnormality. Sinuses: Imaged portions are clear. Orbits: Absent left lens, which may be postsurgical. The orbits are otherwise unremarkable. Review of the MIP images confirms the above findings CTA NECK FINDINGS Aortic arch: Status post coarctation repair, with overall unchanged appearance of the imaged portions of the aortic arch compared to 07/01/2019, with narrowing of the distal arch and small superior outpouching. 2 vessel arch with a common origin of the brachiocephalic and left common carotid arteries. No evidence of aneurysm or dissection. Right carotid system: No evidence of dissection, stenosis (50% or greater) or occlusion. Left carotid system: No evidence of dissection, stenosis (50% or greater) or occlusion. Vertebral arteries: Left dominant. No evidence of dissection, stenosis (50% or greater) or occlusion. Skeleton: Degenerative changes in the cervical spine. No acute osseous abnormality. Other neck: Negative Upper chest: Linear atelectasis and/or scarring. No focal pulmonary opacity. No pleural effusion. Review of the MIP images confirms the above findings CTA HEAD FINDINGS Anterior circulation: Both internal carotid arteries are patent to the termini, without stenosis or other abnormality. A1 segments patent. Normal anterior communicating artery. Anterior cerebral arteries are patent to their distal aspects. No M1 stenosis or occlusion. Normal MCA bifurcations. Distal MCA branches perfused and symmetric. Posterior circulation: Vertebral arteries patent to the vertebrobasilar junction without stenosis.  Posterior inferior cerebral arteries patent bilaterally. Basilar patent to its distal aspect. Superior cerebellar arteries patent bilaterally. PCAs perfused to their distal aspects without focal stenosis, although distal branches are diminutive. The posterior communicating arteries are not definitively visualized. Venous sinuses: As permitted by contrast timing, patent. Anatomic variants: None significant Review of the MIP images confirms the above findings CT VENOGRAM FINDINGS No dural venous sinus thrombosis or stenosis. IMPRESSION: 1. No acute intracranial process. 2. No intracranial large vessel occlusion or significant stenosis. 3. No dural venous sinus thrombosis or stenosis. 4. Partially imaged aortic arch, with unchanged postoperative appearance of the distal arch, better evaluated on 07/01/2019. Electronically Signed   By: Wiliam Ke M.D.   On: 02/07/2021 20:55   MR Brain W and Wo Contrast  Result Date: 02/07/2021 CLINICAL DATA:  papilladema; Vision loss, monocular EXAM: MRI HEAD AND ORBITS WITHOUT AND WITH CONTRAST TECHNIQUE: Multiplanar, multiecho pulse sequences of the brain and surrounding structures were obtained without and with intravenous contrast. Multiplanar, multiecho pulse sequences of the orbits and surrounding structures were obtained including fat saturation techniques, before and after intravenous contrast administration. CONTRAST:  6mL GADAVIST GADOBUTROL 1 MMOL/ML IV SOLN COMPARISON:  None. FINDINGS: MRI HEAD FINDINGS Brain: No acute infarction, hemorrhage, hydrocephalus, extra-axial collection or mass lesion. Moderate patchy and confluent T2/FLAIR hyperintensity in the supratentorial and pontine white matter. No abnormal enhancement. Vascular: Major arterial flow voids are maintained at the skull base. Skull and upper cervical spine: Normal marrow signal. Other: No mastoid effusions MRI ORBITS FINDINGS Orbits: No traumatic or inflammatory finding. Optic nerves, orbital fat,  extraocular muscles, vascular structures, and lacrimal glands are normal. Unremarkable right globe. Absent lens and small anterior chamber on  the left. No abnormal enhancement of the optic nerves. Visualized sinuses: Mild ethmoid air cell and frontal sinus mucosal thickening otherwise, clear sinuses. Soft tissues: Negative. IMPRESSION: MRI head 1. No evidence of acute intracranial abnormality. 2. Moderate T2/FLAIR hyperintensities within the white matter, nonspecific but compatible with chronic microvascular ischemic disease. Sequela of chronic demyelination is a differential consideration. MRI orbits: 1. Mild flattening of the posterior left globe, possibly related to reported papilledema. Otherwise, no acute orbital findings. 2. Absent left lens with small left anterior chamber, probably postsurgical or chronic in this patient with reported chronic left blindness. Recommend correlation with ophthalmologic history. Electronically Signed   By: Feliberto Harts M.D.   On: 02/07/2021 12:45   CT VENOGRAM HEAD  Result Date: 02/07/2021 CLINICAL DATA:  Right eye blurry vision, papilledema, concern for increased intracranial pressure, dural venous sinus thrombosis suspected. EXAM: CT ANGIOGRAPHY HEAD AND NECK CT VENOGRAM HEAD TECHNIQUE: Multidetector CT imaging of the head and neck was performed using the standard protocol during bolus administration of intravenous contrast. Multiplanar CT image reconstructions and MIPs were obtained to evaluate the vascular anatomy. Carotid stenosis measurements (when applicable) are obtained utilizing NASCET criteria, using the distal internal carotid diameter as the denominator. Venographic phase images of the brain were obtained following the administration of intravenous contrast. Multiplanar reformats and maximum intensity projections were generated. CONTRAST:  48mL OMNIPAQUE IOHEXOL 350 MG/ML SOLN COMPARISON:  02/05/2021 CT head, 02/07/2021 MRI head, no prior CTA or CT the.  FINDINGS: CT HEAD FINDINGS Brain: No acute infarct, hemorrhage, mass, mass effect, or midline shift. No extra-axial collection or hydrocephalus. Ventricles and sulci are within normal limits for age. Periventricular white matter changes, likely the sequela of chronic small vessel ischemic disease. Vascular: No hyperdense vessel. Skull: No acute osseous abnormality. Sinuses: Imaged portions are clear. Orbits: Absent left lens, which may be postsurgical. The orbits are otherwise unremarkable. Review of the MIP images confirms the above findings CTA NECK FINDINGS Aortic arch: Status post coarctation repair, with overall unchanged appearance of the imaged portions of the aortic arch compared to 07/01/2019, with narrowing of the distal arch and small superior outpouching. 2 vessel arch with a common origin of the brachiocephalic and left common carotid arteries. No evidence of aneurysm or dissection. Right carotid system: No evidence of dissection, stenosis (50% or greater) or occlusion. Left carotid system: No evidence of dissection, stenosis (50% or greater) or occlusion. Vertebral arteries: Left dominant. No evidence of dissection, stenosis (50% or greater) or occlusion. Skeleton: Degenerative changes in the cervical spine. No acute osseous abnormality. Other neck: Negative Upper chest: Linear atelectasis and/or scarring. No focal pulmonary opacity. No pleural effusion. Review of the MIP images confirms the above findings CTA HEAD FINDINGS Anterior circulation: Both internal carotid arteries are patent to the termini, without stenosis or other abnormality. A1 segments patent. Normal anterior communicating artery. Anterior cerebral arteries are patent to their distal aspects. No M1 stenosis or occlusion. Normal MCA bifurcations. Distal MCA branches perfused and symmetric. Posterior circulation: Vertebral arteries patent to the vertebrobasilar junction without stenosis. Posterior inferior cerebral arteries patent  bilaterally. Basilar patent to its distal aspect. Superior cerebellar arteries patent bilaterally. PCAs perfused to their distal aspects without focal stenosis, although distal branches are diminutive. The posterior communicating arteries are not definitively visualized. Venous sinuses: As permitted by contrast timing, patent. Anatomic variants: None significant Review of the MIP images confirms the above findings CT VENOGRAM FINDINGS No dural venous sinus thrombosis or stenosis. IMPRESSION: 1. No acute intracranial process.  2. No intracranial large vessel occlusion or significant stenosis. 3. No dural venous sinus thrombosis or stenosis. 4. Partially imaged aortic arch, with unchanged postoperative appearance of the distal arch, better evaluated on 07/01/2019. Electronically Signed   By: Wiliam Ke M.D.   On: 02/07/2021 20:55   MR ORBITS W WO CONTRAST  Result Date: 02/07/2021 CLINICAL DATA:  papilladema; Vision loss, monocular EXAM: MRI HEAD AND ORBITS WITHOUT AND WITH CONTRAST TECHNIQUE: Multiplanar, multiecho pulse sequences of the brain and surrounding structures were obtained without and with intravenous contrast. Multiplanar, multiecho pulse sequences of the orbits and surrounding structures were obtained including fat saturation techniques, before and after intravenous contrast administration. CONTRAST:  66mL GADAVIST GADOBUTROL 1 MMOL/ML IV SOLN COMPARISON:  None. FINDINGS: MRI HEAD FINDINGS Brain: No acute infarction, hemorrhage, hydrocephalus, extra-axial collection or mass lesion. Moderate patchy and confluent T2/FLAIR hyperintensity in the supratentorial and pontine white matter. No abnormal enhancement. Vascular: Major arterial flow voids are maintained at the skull base. Skull and upper cervical spine: Normal marrow signal. Other: No mastoid effusions MRI ORBITS FINDINGS Orbits: No traumatic or inflammatory finding. Optic nerves, orbital fat, extraocular muscles, vascular structures, and  lacrimal glands are normal. Unremarkable right globe. Absent lens and small anterior chamber on the left. No abnormal enhancement of the optic nerves. Visualized sinuses: Mild ethmoid air cell and frontal sinus mucosal thickening otherwise, clear sinuses. Soft tissues: Negative. IMPRESSION: MRI head 1. No evidence of acute intracranial abnormality. 2. Moderate T2/FLAIR hyperintensities within the white matter, nonspecific but compatible with chronic microvascular ischemic disease. Sequela of chronic demyelination is a differential consideration. MRI orbits: 1. Mild flattening of the posterior left globe, possibly related to reported papilledema. Otherwise, no acute orbital findings. 2. Absent left lens with small left anterior chamber, probably postsurgical or chronic in this patient with reported chronic left blindness. Recommend correlation with ophthalmologic history. Electronically Signed   By: Feliberto Harts M.D.   On: 02/07/2021 12:45     Evelena Leyden, DO 02/08/2021, 9:15 AM PGY-2, [facility] FPTS Intern pager: [phone], text pages welcome

## 2021-02-08 NOTE — Progress Notes (Signed)
Neurology Progress Note  Brief HPI: 65 y.o. female with PMHx of aphakia of her left eye, congenital rubella with deafness and blindness in the left eye, GERD, hypothyroidism, obesity, bicuspid aortic valve s/p biprosthetic valve replacement, chronic diastolic CHF, HLD, HTN who presented to the ED 11/21 for evaluation of right eye blurred vision since Wednesday, 11/16, initially noticing increased lacrimation and eye pain with movement. Her blurred vision was intermittent without progression over 3 days prior to worsening. She saw an ophthalmologist on 11/18 with elevation of the optic disc in the right eye consistent with papilledema of the right optic nerve and she was recommended to come to the ED for MRI brain and orbit imaging. Imaging delayed due to patient's severe claustrophobia.   Subjective: No acute overnight events  Exam: Vitals:   02/08/21 0328 02/08/21 0821  BP: (!) 114/57 124/88  Pulse: 82 90  Resp: (!) 22 (!) 21  Temp: 97.7 F (36.5 C) 97.8 F (36.6 C)  SpO2: 91% 92%   Gen: Sitting up comfortably in bed, in no acute distress Resp: non-labored breathing, no respiratory distress, SpO2 92% on room air Abd: soft, non-tender, non-distended  Neuro: Mental Status: Awake, alert, and oriented x 4.  Fluently communicates via ASL video interpreter, comprehension is intact.  No neglect is noted. Cranial Nerves: Left eye blindness (chronic), right pupil is round and reactive to light. There is a visual field deficit in the right superior monocular hemi-visual field. EOMI without ptosis, gaze preference, or nystagmus. Face is symmetric resting and with movement. Patient is deaf from birth.  Motor: Spontaneously moves all extremities with antigravity movement 5/5 strength throughout.  Normal tone and bulk.  Sensory:Intact and symmetric to light touch throughout each extremity.  Gait: Deferred  Pertinent Labs: CBC    Component Value Date/Time   WBC 6.2 02/06/2021 0334   RBC 4.58  02/06/2021 0334   HGB 13.6 02/06/2021 0334   HGB 14.6 04/26/2020 1316   HCT 41.6 02/06/2021 0334   HCT 43.1 04/26/2020 1316   PLT 217 02/06/2021 0334   PLT 226 04/26/2020 1316   MCV 90.8 02/06/2021 0334   MCV 87 04/26/2020 1316   MCH 29.7 02/06/2021 0334   MCHC 32.7 02/06/2021 0334   RDW 13.1 02/06/2021 0334   RDW 13.4 04/26/2020 1316   LYMPHSABS 1.4 02/05/2021 1503   MONOABS 0.6 02/05/2021 1503   EOSABS 0.2 02/05/2021 1503   BASOSABS 0.0 02/05/2021 1503   CMP     Component Value Date/Time   NA 139 02/06/2021 0334   NA 139 09/21/2020 1044   K 3.7 02/06/2021 0334   CL 103 02/06/2021 0334   CO2 28 02/06/2021 0334   GLUCOSE 115 (H) 02/06/2021 0334   BUN 20 02/06/2021 0334   BUN 22 09/21/2020 1044   CREATININE 0.72 02/06/2021 0334   CREATININE 0.90 01/12/2016 1544   CALCIUM 9.4 02/06/2021 0334   PROT 6.2 (L) 02/06/2021 0334   PROT 7.1 09/21/2020 1044   ALBUMIN 3.5 02/06/2021 0334   ALBUMIN 4.5 09/21/2020 1044   AST 26 02/06/2021 0334   ALT 27 02/06/2021 0334   ALKPHOS 61 02/06/2021 0334   BILITOT 0.4 02/06/2021 0334   BILITOT 0.3 09/21/2020 1044   GFRNONAA >60 02/06/2021 0334   GFRAA 96 04/26/2020 1316   Lab Results  Component Value Date   VITAMINB12 668 02/06/2021   Lab Results  Component Value Date   FOLATE 51.3 02/06/2021   Lab Results  Component Value Date   ESRSEDRATE 3  02/06/2021   RPR 11/22: Non Reactive NMO-IgG < 1.5 Bartonella negative HSV 1&2 DNA negative  Pending labs: Serum lyme, ANA with IFA, Toxoplasma  Imaging Reviewed:  CT angio head neck wwo with CT venogram 11/23: 1. No acute intracranial process. 2. No intracranial large vessel occlusion or significant stenosis. 3. No dural venous sinus thrombosis or stenosis. 4. Partially imaged aortic arch, with unchanged postoperative appearance of the distal arch, better evaluated on 07/01/2019.  MRI head + MRI orbits wwo contrast 11/23: MRI head  1. No evidence of acute intracranial  abnormality. 2. Moderate T2/FLAIR hyperintensities within the white matter, nonspecific but compatible with chronic microvascular ischemic disease. Sequela of chronic demyelination is a differential consideration.   MRI orbits: 1. Mild flattening of the posterior left globe, possibly related to reported papilledema. Otherwise, no acute orbital findings. 2. Absent left lens with small left anterior chamber, probably postsurgical or chronic in this patient with reported chronic left blindness. Recommend correlation with ophthalmologic history.  Assessment: 65 y.o. 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 Saw ophthalmologist and diagnosed with R optic neuritis with optic disc edema. Her neurologic examination is notable for R superior monocular hemi-visual field deficit.   MRI brain imaging is without enhancement of the R optic nerve to indicate optic neuritis and NMO labs are negative, though MRI imaging is not 100% sensitive. She has significant confluent white matter disease in the bilateral cerebral hemispheres without discrete lesions suggestive of demyelination and she does not give a history of acquired neurologic deficits transient or otherwise to suggest a longstanding history of MS. Suspect findings are 2/2 combination of chronic small vessel ischemic disease and potentially damage 2/2 congenital rubella (also responsible for her deafness and L eye blindness) although cumulative damage from previous demyelination cannot be ruled out. I suspect her papilledema would have been bilateral but her L optic nerve was previously too damaged to swell. With seeming isolated right papilledema and right blurry vision, CT venogram and  CT angio imaging were obtained without acute abnormality. Will pursue lumbar puncture with opening pressure and send CSF inflammatory markers. I suspect the papilledema is reflective of increased ICP and less likely optic neuritis. However, given that she is deaf and already blind in her L eye, losing vision permanently in her R eye would be catastrophic for ability to communicate and overall quality of life. As such, we will continue high-dose steroids to treat possible inflammatory etiologies while we pursue further workup.   Recommendations: - Follow up on pending labs  - LP unable to be complete at bedside due to significant scoliosis without clear landmarks for bedside attempt - Lumbar puncture via IR tomorrow for opening pressure and CSF cell count with differentials, culture and gram stain, protein, glucose, oligoclonal bands, IgG index, cryptococcal antigen - Consult to inpatient ophthalmology tomorrow for examine and reassessment - Neurology will continue to follow  Lanae Boast, AGACNP-BC [facility] [phone]  Attending Neurohospitalist Addendum Patient seen and examined with APP/Resident. Agree with the history and physical as documented above. Agree with the plan as documented, which I helped formulate. I have independently reviewed the chart, obtained history, review of systems and examined the patient.I have personally reviewed pertinent head/neck/spine imaging (CT/MRI). Please feel free to call with any questions.  --  Bing Neighbors, MD [facility] [phone]  If 7pm- 7am, please page neurology on call as listed in AMION.

## 2021-02-09 ENCOUNTER — Inpatient Hospital Stay (HOSPITAL_COMMUNITY): Payer: Medicare HMO

## 2021-02-09 DIAGNOSIS — H538 Other visual disturbances: Secondary | ICD-10-CM | POA: Diagnosis not present

## 2021-02-09 LAB — LYME DISEASE, WESTERN BLOT
IgG P18 Ab.: ABSENT
IgG P23 Ab.: ABSENT
IgG P28 Ab.: ABSENT
IgG P30 Ab.: ABSENT
IgG P39 Ab.: ABSENT
IgG P45 Ab.: ABSENT
IgG P58 Ab.: ABSENT
IgG P66 Ab.: ABSENT
IgG P93 Ab.: ABSENT
IgM P23 Ab.: ABSENT
IgM P39 Ab.: ABSENT
IgM P41 Ab.: ABSENT
Lyme IgG Wb: NEGATIVE
Lyme IgM Wb: NEGATIVE

## 2021-02-09 LAB — SEDIMENTATION RATE: Sed Rate: 7 mm/hr (ref 0–22)

## 2021-02-09 LAB — C-REACTIVE PROTEIN: CRP: 0.6 mg/dL (ref <1.0)

## 2021-02-09 IMAGING — RF DG SPINAL PUNCT LUMBAR DIAG WITH FL CT GUIDANCE
3 series · 3 of 3 positions shown · non-contrast
Comparison: None

CLINICAL DATA: Papilledema

EXAM:
DIAGNOSTIC LUMBAR PUNCTURE UNDER FLUOROSCOPIC GUIDANCE

[Series 1: fluoro_iodine 2fps_bw · 0.18mm/px · 1 of 1 slices shown (1 of 3)]
[im 1/1]
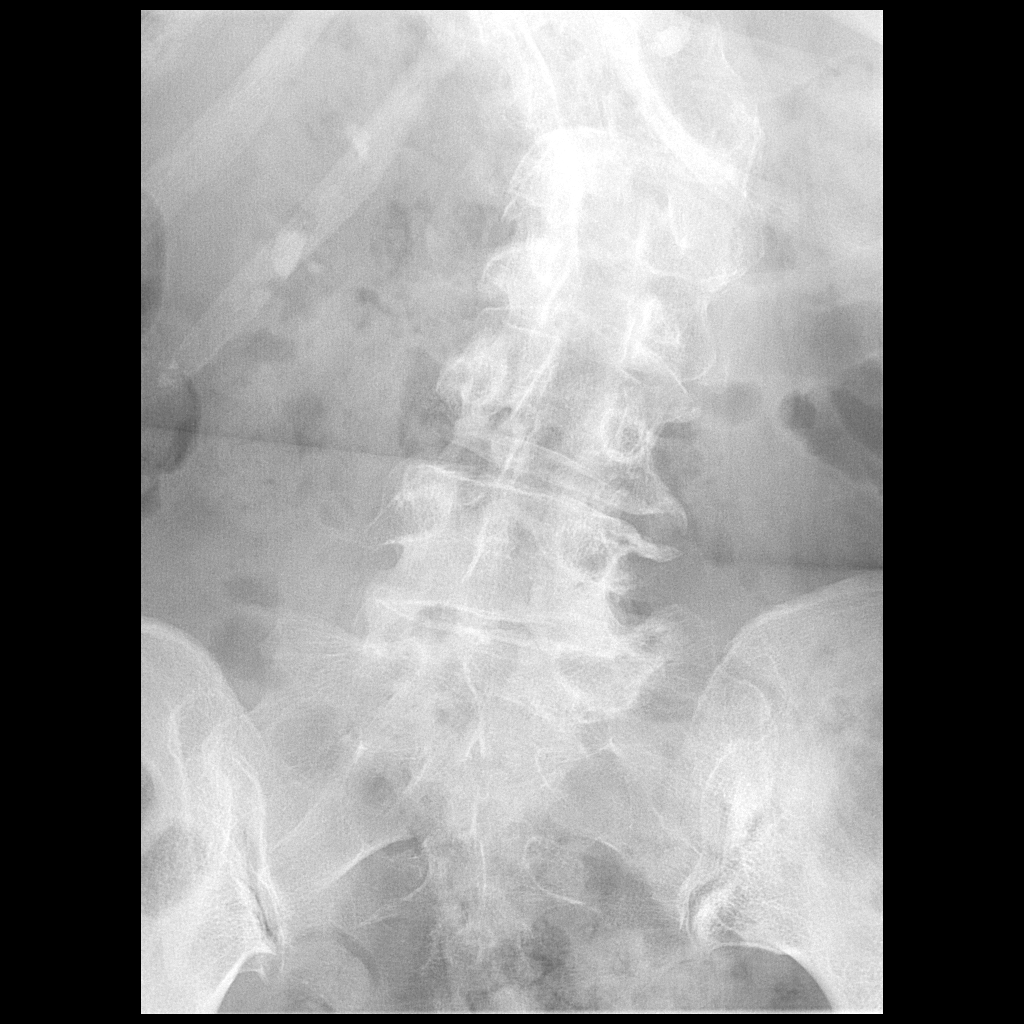

[Series 2: fluoro_iodine 2fps_bw · 0.19mm/px · 1 of 1 slices shown (2 of 3)]
[im 1/1]
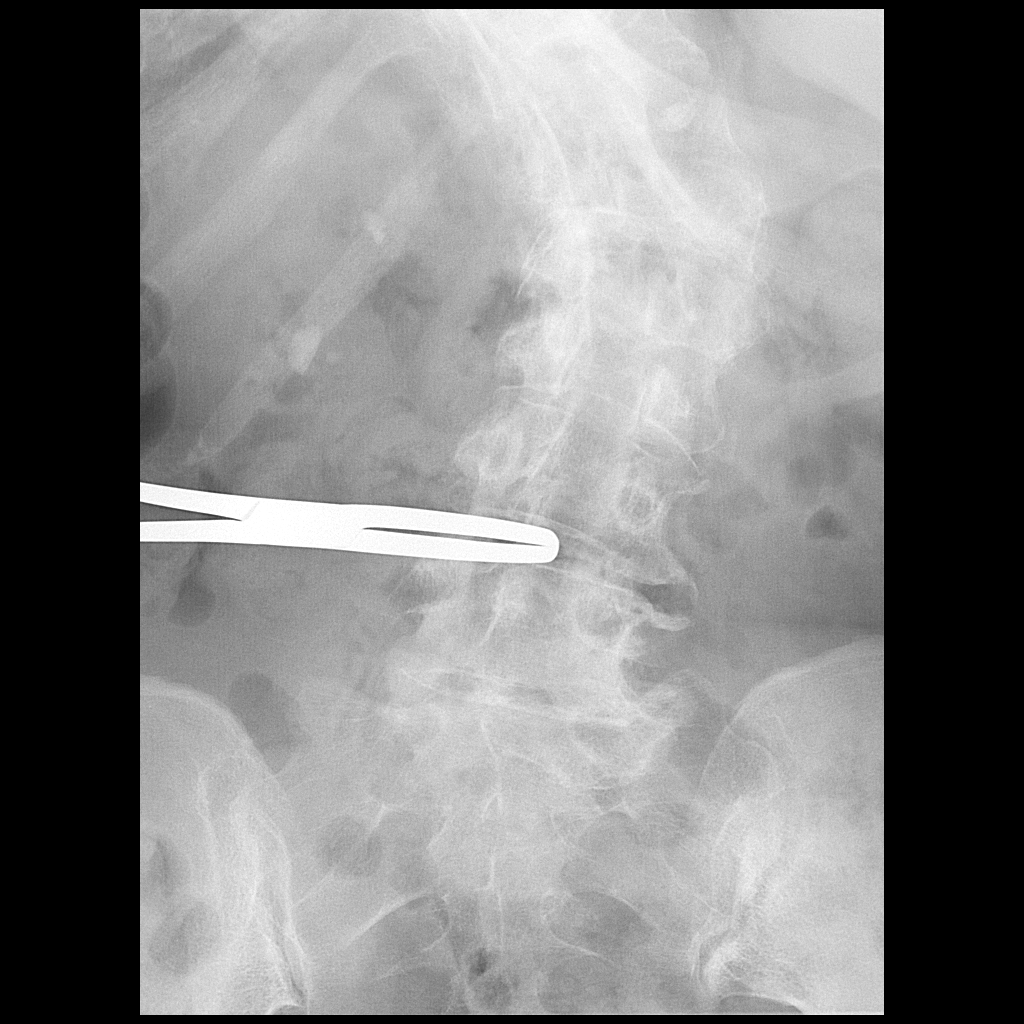

[Series 3: fluoro_iodine 2fps_bw · 0.17mm/px · 1 of 1 slices shown (3 of 3)]
[im 1/1]
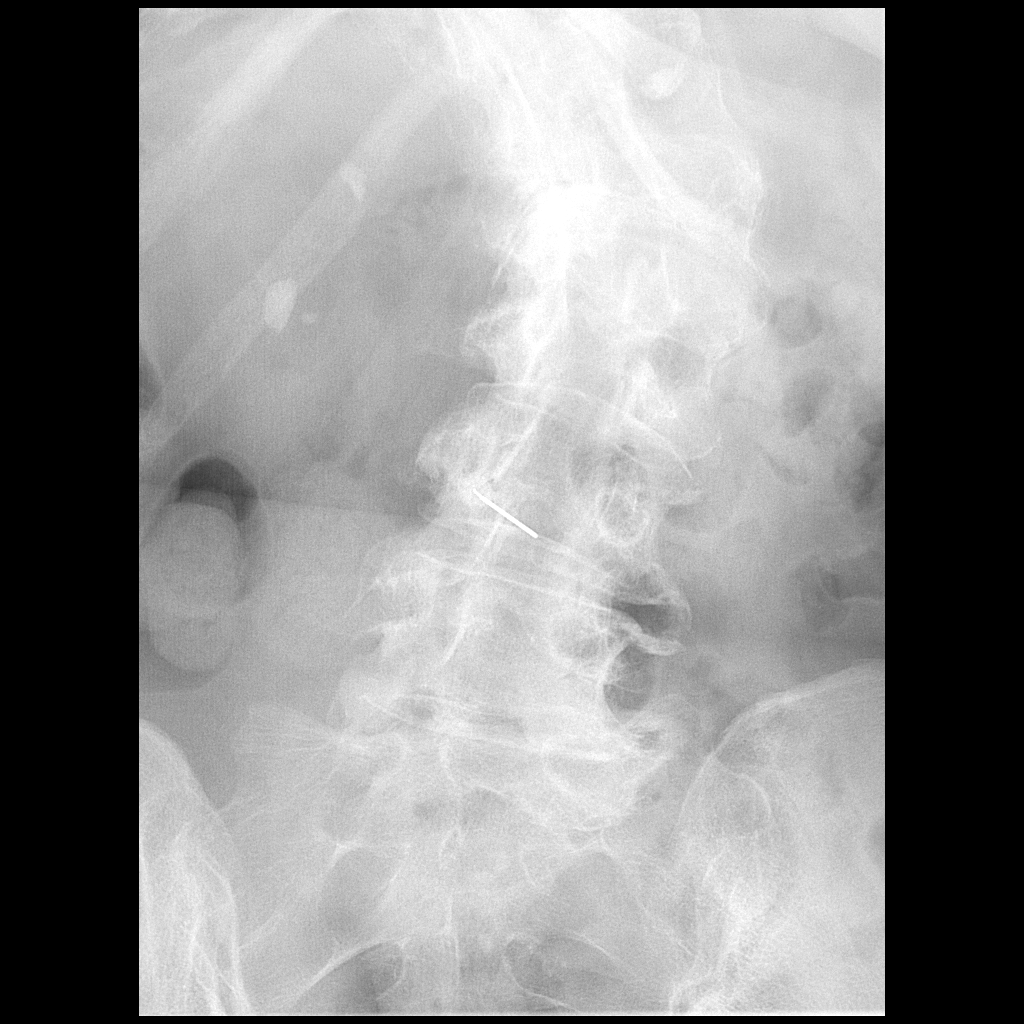

[3 of 3 positions shown; findings below may reference images not displayed]

FLUOROSCOPY TIME:  Fluoroscopy Time:  42 seconds

Radiation Exposure Index (if provided by the fluoroscopic device):
12 mGy

PROCEDURE:
Informed consent was obtained from the patient prior to the
procedure, including potential complications of headache, allergy,
and pain. With the patient prone, the lower back was prepped with
Betadine. 1% Lidocaine was used for local anesthesia. Lumbar
puncture was attempted first by ASIPI, PA and then by
ASIPI at the L4-L5 level using a 20 gauge needle. After
multiple unsuccessful attempts in the setting of scoliosis,
degenerative changes, and no available cross sectional spine
imaging, procedure was aborted. There were no apparent
complications.
IMPRESSION: Unsuccessful fluoroscopic guided lumbar puncture.

## 2021-02-09 MED ORDER — LIDOCAINE HCL (PF) 1 % IJ SOLN
5.0000 mL | Freq: Once | INTRAMUSCULAR | Status: AC
  Administered 2021-02-09: 09:00:00 5 mL

## 2021-02-09 MED ORDER — LORAZEPAM 2 MG/ML IJ SOLN
2.0000 mg | Freq: Once | INTRAMUSCULAR | Status: AC | PRN
  Administered 2021-02-09: 2 mg via INTRAVENOUS
  Filled 2021-02-09: qty 1

## 2021-02-09 NOTE — Procedures (Addendum)
Request for image guided LP for right papilledema, procedure unable to be completed at bedside due to scoliosis.  Pre-procedure imaging shows significant scoliosis with miniscule potential window at L4-5 level. Multiple attempts by this writer and Dr. Guadlupe Spanish were made at this level without success. Unable to identify any other acceptable level for further attempts and as such procedure was aborted.  If still desired would recommend discussing possible cervical level attempt with NIR or neurosurgery.  Lynnette Caffey, PA-C

## 2021-02-09 NOTE — Consult Note (Signed)
Reason for consult: evaluate papilledema  HPI: Ashley Torres is an 65 y.o. female.  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  She is also blind OS.  She reports she has seeing optometrists at [facility] for several years.  She recently went to [facility] for a  5 day history of blurred vision OD (her only seeing eye)   There, she was diagnosed with papilledema and sent to the ER.  She was admitted on 11/21.    Work-up for suspected elevated cranial pressure in progress.  She had neuroimaging.  MRI did not show enhancement of optic nerves, and CTV did not show thrombosis.  Per neurology, Lumbar puncture was aborted due to scoliosis despite assistance from Interventional radiology.  Patient says she has had some slight increase in headache from baseline. She described " some pressure" in her head via an interpreter.  She denies nausea.  She denies diabetes.  She denies trauma.  She denies eye pain.  It is unclear how here blood pressure control has been prior to the vision change.  She does not have diabetes.  She cannot tell if there has been a change in the vision in her bad eye (OS) because the baseline is so poor.  Past ocular history:  congenital rubella.  It sounds like she had a congential cataract OS from the rubella.  She had "one or two surgeries OS as a child.  Aphakic OS now.  Past Medical History:  Diagnosis Date   Allergic rhinitis    Anxiety    Aortic stenosis    Aphakia of left eye    Aphakia of left eye 06/29/2014   Bicuspid aortic valve    Cervical prolapse 09/14/2014   Stage 3 cervical prolapse with urge incontinence per Gynecology. Referred to pelvic floor PT and bladder training   Chronic back pain 2015   Chronic edema    Chronic sinusitis    Deafness congenital    [place] of right optic disc    Environmental and seasonal allergies    Gastric stress ulcer    GERD (gastroesophageal reflux disease) 1990   H/O aortic coarctation repair    Hyperlipidemia 1997    Hypertension 1997   Hypothyroidism    Nuclear sclerosis of right eye    Obesity    Rubella syndrome, congenital    S/P AVR (aortic valve replacement)    Scoliosis    Uterine prolapse    Vision impairment    Vision loss, left eye    Past Surgical History:  Procedure Laterality Date   AORTIC VALVE REPLACEMENT  2017   breast biopsies     2001, 2002, & 2005    BREAST EXCISIONAL BIOPSY Left    CARDIAC CATHETERIZATION     COARCTATION OF AORTA REPAIR     repaired at age 33   [place]   blindness caused by german measles   RADIOLOGY WITH ANESTHESIA N/A 02/07/2021   Procedure: MRI WITH ANESTHESIA- BRAIN WITH /WITHOUT  CONSTRAST ORBITS WITH / WITHOUT CONSTRAST;  Surgeon: Radiologist, Medication, MD;  Location: [place];  Service: Radiology;  Laterality: N/A;   THYROID SURGERY  1997   benign mass   Family History  Problem Relation Age of Onset   Other Mother        killed in gas explosion   Asthma Mother    Heart murmur Father    Arthritis Father    Hyperlipidemia Father    GER disease  Father    Arthritis Brother    Sinusitis Brother    Diabetes Paternal Grandmother    Rheumatic fever Paternal Grandmother    Breast cancer Other    Heart disease Neg Hx    Current Facility-Administered Medications  Medication Dose Route Frequency Provider Last Rate Last Admin   0.9 %  sodium chloride infusion  250 mL Intravenous PRN Opyd, Ilene Qua, MD       acetaminophen (TYLENOL) tablet 650 mg  650 mg Oral Q4H PRN Opyd, Ilene Qua, MD   650 mg at 02/08/21 1747   Or   acetaminophen (TYLENOL) 160 MG/5ML solution 650 mg  650 mg Per Tube Q4H PRN Opyd, Ilene Qua, MD       Or   acetaminophen (TYLENOL) suppository 650 mg  650 mg Rectal Q4H PRN Opyd, Ilene Qua, MD       albuterol (PROVENTIL) (2.5 MG/3ML) 0.083% nebulizer solution 2.5 mg  2.5 mg Inhalation Q4H PRN Opyd, Ilene Qua, MD       aspirin EC tablet 81 mg  81 mg Oral Daily Opyd, Ilene Qua, MD   81 mg at 02/09/21 1130   diphenhydrAMINE  (BENADRYL) capsule 25 mg  25 mg Oral Once PRN Opyd, Ilene Qua, MD       famotidine (PEPCID) tablet 10 mg  10 mg Oral Daily Opyd, Ilene Qua, MD   10 mg at 02/09/21 1130   hydrALAZINE (APRESOLINE) tablet 25 mg  25 mg Oral Q6H PRN Opyd, Ilene Qua, MD       hydrochlorothiazide (HYDRODIURIL) tablet 25 mg  25 mg Oral Daily Opyd, Timothy S, MD   25 mg at 02/09/21 1131   levothyroxine (SYNTHROID) tablet 100 mcg  100 mcg Oral QAC breakfast Merrily Brittle, DO   100 mcg at 02/08/21 0547   lisinopril (ZESTRIL) tablet 10 mg  10 mg Oral Daily Opyd, Ilene Qua, MD   10 mg at 02/09/21 1130   menthol-cetylpyridinium (CEPACOL) lozenge 3 mg  1 lozenge Oral PRN Gerrit Heck, MD       methylPREDNISolone sodium succinate (SOLU-MEDROL) 1,000 mg in sodium chloride 0.9 % 50 mL IVPB  1,000 mg Intravenous Daily Derek Jack, MD 66 mL/hr at 02/09/21 1130 1,000 mg at 02/09/21 1130   polyethylene glycol (MIRALAX / GLYCOLAX) packet 17 g  17 g Oral Daily Gerrit Heck, MD   17 g at 02/09/21 1138   senna-docusate (Senokot-S) tablet 1 tablet  1 tablet Oral QHS Gerrit Heck, MD   1 tablet at 02/08/21 2138   sodium chloride flush (NS) 0.9 % injection 3 mL  3 mL Intravenous Q12H Opyd, Ilene Qua, MD   3 mL at 02/09/21 1130   sodium chloride flush (NS) 0.9 % injection 3 mL  3 mL Intravenous PRN Opyd, Ilene Qua, MD       Allergies  Allergen Reactions   Statins     Upset stomach   Atorvastatin Other (See Comments)    Stomach upset    Erythromycin     Pt can not remember ? Maybe swelling and itching   Furosemide    Hydrocodone-Acetaminophen Itching and Nausea And Vomiting   Meloxicam     Nausea, vomiting   Metoprolol Nausea And Vomiting   Montelukast     Increased head aches   Omeprazole     Never took medication but read about "dangerous side effects"   Potassium Chloride    Rosuvastatin     Myalgias/stomach upset   Social History  Socioeconomic History   Marital status: Single    Spouse name: Not on  file   Number of children: Not on file   Years of education: some college   Highest education level: Not on file  Occupational History    Comment: unemployed  Tobacco Use   Smoking status: Never   Smokeless tobacco: Never  Vaping Use   Vaping Use: Never used  Substance and Sexual Activity   Alcohol use: Never   Drug use: Never   Sexual activity: Never  Other Topics Concern   Not on file  Social History Narrative   Lives alone   Right handed   Caffeine: chocolate only, not often   Social Determinants of Health   Financial Resource Strain: Not on file  Food Insecurity: Not on file  Transportation Needs: Not on file  Physical Activity: Not on file  Stress: Not on file  Social Connections: Not on file  Intimate Partner Violence: Not on file    Review of systems: ROS   Physical Exam:  Blood pressure (!) 149/62, pulse (!) 105, temperature 98.6 F (37 C), resp. rate 18, SpO2 95 %.   VA cc (near):  OD  20/70                         OS  NLP?  She said she 'cannot see anything' OS, but this was difficult through translation  Pupils:   OD round, sluggishly reactive to light,             OS: surgical, minimal reaction to light  I could not access for RAPD because of large angle XT could not allow for rapid swinging of muscle light.    IOP (T pen)  OD 21  OS  unable.  I tried for a long time. Poor cooperation.  Looks up when I open her left eye.  Just keeps telling interpreteter that left eye is no good.  CVF: unable.  Patient could not cooperate  Motility:  OD full ductions  OS full ductions  Balance/alignment:  Large angle Left exotropia   Bedside examination:                                 OD                                       External/adnexa: Normal                                      Lids/lashes:        Normal                                      Conjunctiva        White, quiet        Cornea:              Clear                  AC:                      Deep, quiet  Iris:                     Normal        Lens:                  nuclear sclerotic cataract                                     OS                                       External/adnexa: Normal                                      Lids/lashes:        Normal                                      Conjunctiva        superior thinning of sclera with visible underlying uvea consistent with past surgery        Cornea:              Clear                  AC:                     Deep, quiet                                Iris:                     surgical pupil, pulled superiorly with poor dilation         Lens:                  aphakia     Bedside Dilated fundus exam: (Neo 2.5; Myd 1%)      OD Vitreous            Clear, quiet                                Optic Disc:       markedly elevated with surrounding hemorrhages consistent with edema                   Macula:             no obvious pathology                                           Vessels:          peripapillary hemorrhages        Periphery:         Flat, attached  OS Vitreous            Clear, quiet                                Optic Disc:       possible slight elevation.  No obvious hemorrhages. I do think the cup to disc ratio looks small.  VERY DIFFICULT VIEW TO LEFT EYE.  Patient looks up and will not look down or straight.  Pupil small due to surgery.  I cannot definitively rule out edema                  Macula:             no obvious pathology                                          Vessels:           No obvious pathology, limited view       Periphery:         no view due to poor dilation       Labs/studies: No results found for this or any previous visit (from the past 48 hour(s)). CT ANGIO HEAD NECK W WO CM  Result Date: 02/07/2021 CLINICAL DATA:  Right eye blurry vision, papilledema, concern for increased intracranial pressure, dural  venous sinus thrombosis suspected. EXAM: CT ANGIOGRAPHY HEAD AND NECK CT VENOGRAM HEAD TECHNIQUE: Multidetector CT imaging of the head and neck was performed using the standard protocol during bolus administration of intravenous contrast. Multiplanar CT image reconstructions and MIPs were obtained to evaluate the vascular anatomy. Carotid stenosis measurements (when applicable) are obtained utilizing NASCET criteria, using the distal internal carotid diameter as the denominator. Venographic phase images of the brain were obtained following the administration of intravenous contrast. Multiplanar reformats and maximum intensity projections were generated. CONTRAST:  76m OMNIPAQUE IOHEXOL 350 MG/ML SOLN COMPARISON:  02/05/2021 CT head, 02/07/2021 MRI head, no prior CTA or CT the. FINDINGS: CT HEAD FINDINGS Brain: No acute infarct, hemorrhage, mass, mass effect, or midline shift. No extra-axial collection or hydrocephalus. Ventricles and sulci are within normal limits for age. Periventricular white matter changes, likely the sequela of chronic small vessel ischemic disease. Vascular: No hyperdense vessel. Skull: No acute osseous abnormality. Sinuses: Imaged portions are clear. Orbits: Absent left lens, which may be postsurgical. The orbits are otherwise unremarkable. Review of the MIP images confirms the above findings CTA NECK FINDINGS Aortic arch: Status post coarctation repair, with overall unchanged appearance of the imaged portions of the aortic arch compared to 07/01/2019, with narrowing of the distal arch and small superior outpouching. 2 vessel arch with a common origin of the brachiocephalic and left common carotid arteries. No evidence of aneurysm or dissection. Right carotid system: No evidence of dissection, stenosis (50% or greater) or occlusion. Left carotid system: No evidence of dissection, stenosis (50% or greater) or occlusion. Vertebral arteries: Left dominant. No evidence of dissection, stenosis  (50% or greater) or occlusion. Skeleton: Degenerative changes in the cervical spine. No acute osseous abnormality. Other neck: Negative Upper chest: Linear atelectasis and/or scarring. No focal pulmonary opacity. No pleural effusion. Review of the MIP images confirms the above findings CTA HEAD FINDINGS Anterior circulation: Both internal carotid arteries are patent to the termini, without stenosis or other abnormality. A1  segments patent. Normal anterior communicating artery. Anterior cerebral arteries are patent to their distal aspects. No M1 stenosis or occlusion. Normal MCA bifurcations. Distal MCA branches perfused and symmetric. Posterior circulation: Vertebral arteries patent to the vertebrobasilar junction without stenosis. Posterior inferior cerebral arteries patent bilaterally. Basilar patent to its distal aspect. Superior cerebellar arteries patent bilaterally. PCAs perfused to their distal aspects without focal stenosis, although distal branches are diminutive. The posterior communicating arteries are not definitively visualized. Venous sinuses: As permitted by contrast timing, patent. Anatomic variants: None significant Review of the MIP images confirms the above findings CT VENOGRAM FINDINGS No dural venous sinus thrombosis or stenosis. IMPRESSION: 1. No acute intracranial process. 2. No intracranial large vessel occlusion or significant stenosis. 3. No dural venous sinus thrombosis or stenosis. 4. Partially imaged aortic arch, with unchanged postoperative appearance of the distal arch, better evaluated on 07/01/2019. Electronically Signed   By: Merilyn Baba M.D.   On: 02/07/2021 20:55   CT VENOGRAM HEAD  Result Date: 02/07/2021 CLINICAL DATA:  Right eye blurry vision, papilledema, concern for increased intracranial pressure, dural venous sinus thrombosis suspected. EXAM: CT ANGIOGRAPHY HEAD AND NECK CT VENOGRAM HEAD TECHNIQUE: Multidetector CT imaging of the head and neck was performed using the  standard protocol during bolus administration of intravenous contrast. Multiplanar CT image reconstructions and MIPs were obtained to evaluate the vascular anatomy. Carotid stenosis measurements (when applicable) are obtained utilizing NASCET criteria, using the distal internal carotid diameter as the denominator. Venographic phase images of the brain were obtained following the administration of intravenous contrast. Multiplanar reformats and maximum intensity projections were generated. CONTRAST:  3m OMNIPAQUE IOHEXOL 350 MG/ML SOLN COMPARISON:  02/05/2021 CT head, 02/07/2021 MRI head, no prior CTA or CT the. FINDINGS: CT HEAD FINDINGS Brain: No acute infarct, hemorrhage, mass, mass effect, or midline shift. No extra-axial collection or hydrocephalus. Ventricles and sulci are within normal limits for age. Periventricular white matter changes, likely the sequela of chronic small vessel ischemic disease. Vascular: No hyperdense vessel. Skull: No acute osseous abnormality. Sinuses: Imaged portions are clear. Orbits: Absent left lens, which may be postsurgical. The orbits are otherwise unremarkable. Review of the MIP images confirms the above findings CTA NECK FINDINGS Aortic arch: Status post coarctation repair, with overall unchanged appearance of the imaged portions of the aortic arch compared to 07/01/2019, with narrowing of the distal arch and small superior outpouching. 2 vessel arch with a common origin of the brachiocephalic and left common carotid arteries. No evidence of aneurysm or dissection. Right carotid system: No evidence of dissection, stenosis (50% or greater) or occlusion. Left carotid system: No evidence of dissection, stenosis (50% or greater) or occlusion. Vertebral arteries: Left dominant. No evidence of dissection, stenosis (50% or greater) or occlusion. Skeleton: Degenerative changes in the cervical spine. No acute osseous abnormality. Other neck: Negative Upper chest: Linear atelectasis  and/or scarring. No focal pulmonary opacity. No pleural effusion. Review of the MIP images confirms the above findings CTA HEAD FINDINGS Anterior circulation: Both internal carotid arteries are patent to the termini, without stenosis or other abnormality. A1 segments patent. Normal anterior communicating artery. Anterior cerebral arteries are patent to their distal aspects. No M1 stenosis or occlusion. Normal MCA bifurcations. Distal MCA branches perfused and symmetric. Posterior circulation: Vertebral arteries patent to the vertebrobasilar junction without stenosis. Posterior inferior cerebral arteries patent bilaterally. Basilar patent to its distal aspect. Superior cerebellar arteries patent bilaterally. PCAs perfused to their distal aspects without focal stenosis, although distal branches are diminutive. The  posterior communicating arteries are not definitively visualized. Venous sinuses: As permitted by contrast timing, patent. Anatomic variants: None significant Review of the MIP images confirms the above findings CT VENOGRAM FINDINGS No dural venous sinus thrombosis or stenosis. IMPRESSION: 1. No acute intracranial process. 2. No intracranial large vessel occlusion or significant stenosis. 3. No dural venous sinus thrombosis or stenosis. 4. Partially imaged aortic arch, with unchanged postoperative appearance of the distal arch, better evaluated on 07/01/2019. Electronically Signed   By: Merilyn Baba M.D.   On: 02/07/2021 20:55   DG FL GUIDED LUMBAR PUNCTURE  Result Date: 02/09/2021 CLINICAL DATA:  Papilledema EXAM: DIAGNOSTIC LUMBAR PUNCTURE UNDER FLUOROSCOPIC GUIDANCE COMPARISON:  None FLUOROSCOPY TIME:  Fluoroscopy Time:  42 seconds Radiation Exposure Index (if provided by the fluoroscopic device): 12 mGy PROCEDURE: Informed consent was obtained from the patient prior to the procedure, including potential complications of headache, allergy, and pain. With the patient prone, the lower back was prepped  with Betadine. 1% Lidocaine was used for local anesthesia. Lumbar puncture was attempted first by Candiss Norse, PA and then by Macy Mis, MD at the L4-L5 level using a 20 gauge needle. After multiple unsuccessful attempts in the setting of scoliosis, degenerative changes, and no available cross sectional spine imaging, procedure was aborted. There were no apparent complications. IMPRESSION: Unsuccessful fluoroscopic guided lumbar puncture. Electronically Signed   By: Macy Mis M.D.   On: 02/09/2021 09:51                             Assessment and Plan:  1.Optic nerve edema OD.  I think it is just in her right eye, but I cannot get a great look into her left eye.  (Surgical pupil OS and poor cooperation)  Lack of definitive answer as to whether this is monocular or bilateral disc involvement complicates differential diagnosis.  Plus, if left optic nerve had past edema and secondary atrophy it would not necessary show edema in event of elevated ICP, etc.  I do not have baseline exam on this patient.  As I just discussed with neurology, If I knew she had a small cup to disc ratio at baseline, it would lead me to believe that non-arteritic ischemic optic neuropathy (NAION) OD is the diagnosis.    Despite her relatively young age, I do think we need to rule out arteritic ischemic optic neuropathy (AION), and I discussed this with neurology.  They have offered to order c reacive protein and ESR.  Given our inability to rule out bilateral otic nerve involvement and her endorsement of "pressure in the head", it would be helpful to obtain opening pressure and CSF analysis.  Given her scoliosis, .this is a major hurdle. It sounds like this will require potentially risky neurosurgery.  For the all of the above reasons, this is a very tricky case.  What is so concerning is that we are addressing vision loss in the only eye of a totally deaf patient.  I would strongly consider evaluation at a tertiary  care center with a neuro-ophthalmology department like [facility].   All of the above information was discussed with neurology.    I will try to get a hold of patient's optometrist when their office opens on Monday to see about baseline cup to disc ratio and get more history.  Please call me with any updates.  [place] L 02/09/2021, 1:36 PM  [facility] [phone]

## 2021-02-09 NOTE — Progress Notes (Signed)
Family Medicine Teaching Service Daily Progress Note Intern Pager: [phone]  Patient name: Ashley Torres Medical record number: [id number] Date of birth: January 11, 1956 Age: 65 y.o. Gender: female  Primary Care Provider: Darral Dash, DO Consultants: Neurology, ophthalmology Code Status: Full  Pt Overview and Major Events to Date:  Ashley Torres is a 65 y.o. 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  Assessment and Plan: Ashley Torres is a 65 y.o. 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   Blurred Vision Rt Eye  papilledema  optic neuritis  MRI brain without acute intracranial abnormality and nonspecific hyperintensities.  CT venogram showed no no acute process or large vessel occlusion/stenosis.  LP was unable be performed due to severe scoliosis. Other options for completing testing would require an IR or neurosurgery, which is not available at this time.  Patient has had some improvement of her symptoms but notes that it waxes and wanes.  Ophthalmology was consulted and Dr. Charlotte Sanes will come to see the patient for reevaluation. -Ophthalmology consulted -Unable to complete LP today -Neurology following, appreciate recs -Continue Solumedrol 1G -Discuss with PT/OT about options to prepare in case her vision worsens   HTN BP remains stable with latest BP range 105-158/52-82 -Hydralazine 25 mg q6hr prn for BP > 180/100 -HCTZ 25 mg daily -Lisinopril  10 mg daily    Hypothyroidism -Continue synthroid 100 mcg daily     FEN/GI: Clear liquid diet PPx: Lovenox Dispo:Home in 2-3 days. Barriers include further medical work-up.   Subjective:  History was completed with ASL interpreter.  Patient is very worried about losing her vision and very tearful because of it.  She does note that she has waxing and waning of her vision and that sometimes she has a lot of floaters and sometimes different fields seem blurry.  She does feel like she is seeing a little bit better today.  Objective: Temp:  [97.9 F (36.6 C)-98.7 F (37.1 C)] 98.5 F (36.9 C) (11/25 0832) Pulse Rate:  [76-88] 86 (11/25 0832) Resp:  [18-20] 18 (11/25 0832) BP: (105-158)/(52-82) 158/82 (11/25 0832) SpO2:  [90 %-96 %] 96 % (11/25 9563) Physical Exam: General: NAD, sitting up in bed, tearful during discussion about her site Cardiovascular: Warm and well perfused Respiratory: Breathing comfortably on room air, no increased work of breathing Abdomen: Does not appear distended Extremities: Moving all extremities equally and appropriately  Laboratory: Recent Labs  Lab 02/05/21 1503 02/06/21 0334  WBC 6.3 6.2  HGB 13.9 13.6  HCT 43.0 41.6  PLT 247 217   Recent Labs  Lab 02/05/21 1503 02/06/21 0334  NA 142 139  K 3.8 3.7  CL 103 103  CO2 29 28  BUN 21 20  CREATININE 0.75 0.72  CALCIUM 9.6 9.4  PROT  --  6.2*  BILITOT  --  0.4  ALKPHOS  --  61  ALT  --  27  AST  --  26  GLUCOSE 123* 115*    DG FL GUIDED LUMBAR PUNCTURE  Result Date: 02/09/2021 CLINICAL DATA:  Papilledema EXAM: DIAGNOSTIC LUMBAR PUNCTURE UNDER FLUOROSCOPIC GUIDANCE COMPARISON:  None FLUOROSCOPY TIME:  Fluoroscopy Time:  42 seconds Radiation Exposure Index (if provided by the fluoroscopic device): 12 mGy PROCEDURE: Informed consent was obtained from the patient prior to the procedure, including potential complications of headache, allergy, and pain. With the patient prone, the lower back  was prepped with Betadine. 1% Lidocaine was used for local anesthesia. Lumbar puncture was attempted first by Lynnette Caffey, PA and then by Guadlupe Spanish, MD at the L4-L5 level using a 20 gauge needle. After multiple unsuccessful attempts in the setting of scoliosis, degenerative changes, and no available cross sectional spine imaging, procedure was aborted. There were no apparent complications. IMPRESSION: Unsuccessful fluoroscopic guided lumbar puncture. Electronically Signed   By: Guadlupe Spanish M.D.   On: 02/09/2021 09:51     Imaging/Diagnostic Tests: DG FL GUIDED LUMBAR PUNCTURE  Result Date: 02/09/2021 CLINICAL DATA:  Papilledema EXAM: DIAGNOSTIC LUMBAR PUNCTURE UNDER FLUOROSCOPIC GUIDANCE COMPARISON:  None FLUOROSCOPY TIME:  Fluoroscopy Time:  42 seconds Radiation Exposure Index (if provided by the fluoroscopic device): 12 mGy PROCEDURE: Informed consent was obtained from the patient prior to the procedure, including potential complications of headache, allergy, and pain. With the patient prone, the lower back was prepped with Betadine. 1% Lidocaine was used for local anesthesia. Lumbar puncture was attempted first by Lynnette Caffey, PA and then by Guadlupe Spanish, MD at the L4-L5 level using a 20 gauge needle. After multiple unsuccessful attempts in the setting of scoliosis, degenerative changes, and no available cross sectional spine imaging, procedure was aborted. There were no apparent complications. IMPRESSION: Unsuccessful fluoroscopic guided lumbar puncture. Electronically Signed   By: Guadlupe Spanish M.D.   On: 02/09/2021 09:51     Evelena Leyden, DO 02/09/2021, 12:14 PM PGY-2, [facility] FPTS Intern pager: [phone], text pages welcome

## 2021-02-09 NOTE — Progress Notes (Addendum)
FPTS Brief Progress Note  S: Ashley Torres was seen this p.m., resting comfortably.  Did not awaken patient.   O: BP (!) 142/64 (BP Location: Right Arm)   Pulse 84   Temp 97.9 F (36.6 C) (Oral)   Resp 20   LMP  (LMP Unknown)   SpO2 92%   Physical Exam Vitals and nursing note reviewed.  Constitutional:      General: Ashley Torres is not in acute distress.    Appearance: Ashley Torres is not ill-appearing.  HENT:     Head: Normocephalic and atraumatic.  Cardiovascular:     Rate and Rhythm: Normal rate and regular rhythm.  Pulmonary:     Effort: Pulmonary effort is normal.     A/P: Blurred Vision Rt Eye  papilledema  optic neuritis  Ruled out Arteritic ischemic optic neuropathy (AION), with CRP 0.6 and ESR 7. Patient is negative for toxo, HSV, and Bartonella.  Neuromyelitis optica auto IgG <1.5. Considering OD non-arteritic ischemic optic neuropathy (NAION), per ophthalmology and neurology. Unable to rule out bilateral optic nerve involvement with LP due to scoliosis. Continuing steroid treatment in the case that etiologies inflammatory. Considering transferring to [facility] or [facility] or other tertiary care institution with neuro-ophthalmology.  If unable to transfer, neuro attempt to consult neurosurgery or neuro IR for cervical puncture Management per ophthalmology and neurology, appreciate assistance and recommendations IV Solu-Medrol 1000 mg daily x5 days (day 4/5) Consider transferring to tertiary institution with neuro-ophthalmology Considering cervical puncture if unable to transfer  HTN BP 150s/70s, stable. Lisinopril 10 mg daily HCTZ 25 mg daily Hydralazine 25 mg PRN's for BP >180/100 every 6 hours  Hypothyroidism Synthroid 100 mcg daily 2 hours before meals on an empty stomach  - Orders reviewed. No a.m. labs required.   Remainder of plan per day team/daily progress note.  Merrily Brittle, DO 02/09/2021, 10:47 PM PGY-1, [facility] Night Resident  Please page [phone]  with questions.

## 2021-02-09 NOTE — Progress Notes (Signed)
Neurology was unable to examine this AM on rounds because she was in IR undergoing LP.  Summary of events and next steps:  - Dr. Ellie Lunch with ophthalmology kindly saw the patient in consultation today and we discussed patient by phone. She confirmed R optic nerve edema on examination. She cannot r/o bilateral optic nerve involvement given that if L optic nerve (known damage 2/2 congenital rubella) had past edema and secondary atrophy it would not necessarily show edema in the event of ICP. - IR LP was unable to obtain CSF 2/2 severe scoliosis. Measurement of opening pressure would thus require cervical level attempt with NIR or neurosurgery.  - Per Dr. Benna Dunks request I ordered ESR and CRP to r/o GCA - Will continue IV solumedrol 1041m daily for planned 5 day course in the event that etiology is inflammatory - After ESR and CRP result, [facility] with [facility], given that patient is already deaf and blind in her other eye. Losing vision in her R eye would have catastrophic effects on her ability to communicate and overall quality of life. - If unable to transfer, [facility] about cervical level attempt to measure opening pressure  Will follow up tomorrrow.  CSu Monks MD [facility] [phone] If 7pm- 7am, please page neurology on call as listed in [place]

## 2021-02-10 DIAGNOSIS — H469 Unspecified optic neuritis: Principal | ICD-10-CM

## 2021-02-10 DIAGNOSIS — H5440 Blindness, one eye, unspecified eye: Secondary | ICD-10-CM | POA: Diagnosis present

## 2021-02-10 DIAGNOSIS — H905 Unspecified sensorineural hearing loss: Secondary | ICD-10-CM

## 2021-02-10 DIAGNOSIS — H538 Other visual disturbances: Secondary | ICD-10-CM | POA: Diagnosis not present

## 2021-02-10 DIAGNOSIS — H9193 Unspecified hearing loss, bilateral: Secondary | ICD-10-CM | POA: Diagnosis not present

## 2021-02-10 NOTE — Progress Notes (Signed)
Family Medicine Teaching Service Daily Progress Note Intern Pager: [phone]  Patient name: Ashley Torres Medical record number: [id number] Date of birth: March 21, 1955 Age: 65 y.o. Gender: female  Primary Care Provider: Darral Dash, DO Consultants: Neurology, Ophthalmology Code Status: Full  Pt Overview and Major Events to Date:  Ashley Torres is a 65 y.o. 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  Assessment and Plan:  Ashley Torres is a 65 y.o. 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   Blurred Vision Rt Eye  papilledema  optic neuritis  Considering transfer to [facility] or [facility] type tertiary care institution with neuro-ophthalmology and neurology. Patient in NAD this morning.   -Ophthalmology following, appreciate recs -Neurology following, appreciate recs -Continue solumedrol 1 g (day 5/5) -Discuss with PT/OT about options to prepare care if vision worsens  HTN BP stable, 130-140's/60-70's today. -Continue hydralazine 25 mg q6hr prn for BP > 180/100 -HCTZ 25 mg daily -Lisinopril 10 mg daily  Hypothyroidism -Continue synthroid 100 mcg daily  FEN/GI: Regular diet PPx: Lovenox Dispo: Possible transfer to Neuro-ophthalmology service  tomorrow.   Subjective:  Patient feeling depressed today, no thoughts of SI, is encouraged by her faith  Objective: Temp:  [97.9 F (36.6 C)-98.8 F (37.1 C)] 98.5 F (36.9 C) (11/26 0321) Pulse  Rate:  [82-105] 86 (11/26 0321) Resp:  [18-20] 20 (11/26 0321) BP: (138-171)/(62-82) 138/72 (11/26 0321) SpO2:  [91 %-96 %] 91 % (11/26 0305) Physical Exam: General: Well appearing, well nourished, white woman, NASD Cardiovascular: RRR, 3/6 systolic murmur Respiratory: CTABL Abdomen: Soft, NTTP Extremities: Moving all extremities independently. No edema in legs  Laboratory: Recent Labs  Lab 02/05/21 1503 02/06/21 0334  WBC 6.3 6.2  HGB 13.9 13.6  HCT 43.0 41.6  PLT 247 217   Recent Labs  Lab 02/05/21 1503 02/06/21 0334  NA 142 139  K 3.8 3.7  CL 103 103  CO2 29 28  BUN 21 20  CREATININE 0.75 0.72  CALCIUM 9.6 9.4  PROT  --  6.2*  BILITOT  --  0.4  ALKPHOS  --  61  ALT  --  27  AST  --  26  GLUCOSE 123* 115*      Imaging/Diagnostic Tests:   Bess Kinds, MD 02/10/2021, 6:20 AM PGY-1, [facility] FPTS Intern pager: [phone], text pages welcome

## 2021-02-10 NOTE — Progress Notes (Addendum)
Neurology Progress Note  Brief HPI: 65 y.o. female with PMHx of aphakia of her left eye, congenital rubella with deafness and blindness in the left eye, GERD, hypothyroidism, obesity, bicuspid aortic valve s/p biprosthetic valve replacement, chronic diastolic CHF, HLD, HTN who presented to the ED 11/21 for evaluation of right eye blurred vision since Wednesday, 11/16, initially noticing increased lacrimation and eye pain with movement. Her blurred vision was intermittent without progression over 3 days prior to worsening. She saw an ophthalmologist on 11/18 with elevation of the optic disc in the right eye consistent with papilledema of the right optic nerve and she was recommended to come to the ED for MRI brain and orbit imaging. Imaging delayed due to patient's severe claustrophobia.   Subjective: No acute overnight events. Spoke with patient via ASL interpretor. She is tearful and visibly upset over the loss of her eyesight. She feels like her vision is improving slowly, can see TV and things around the room but are blurry. She states she has the most difficulty with reading and looking at her phone at this time, but her vision seems to wax and wane.   Exam: Vitals:   02/10/21 1159 02/10/21 1200  BP: (!) 173/90   Pulse: 93   Resp: 19   Temp: 98.6 F (37 C) 98.6 F (37 C)  SpO2: 99%    Gen: Sitting up comfortably in bed, in no acute distress Resp: non-labored breathing, no respiratory distress, on room air Abd: soft, non-tender, non-distended  Neuro: Mental Status: Awake, alert, and oriented x 4.  Fluently communicates via ASL video interpreter, comprehension is intact.  No neglect is noted. Cranial Nerves: Left eye blindness (chronic), right pupil is round and reactive to light. EOMI without ptosis, gaze preference, or nystagmus. Face is symmetric resting and with movement. Patient is deaf from birth.  Motor: Spontaneously moves all extremities with antigravity movement 5/5 strength  throughout.  Normal tone and bulk.  Sensory:Intact and symmetric to light touch throughout each extremity.  Gait: Deferred  Pertinent Labs: CBC    Component Value Date/Time   WBC 6.2 02/06/2021 0334   RBC 4.58 02/06/2021 0334   HGB 13.6 02/06/2021 0334   HGB 14.6 04/26/2020 1316   HCT 41.6 02/06/2021 0334   HCT 43.1 04/26/2020 1316   PLT 217 02/06/2021 0334   PLT 226 04/26/2020 1316   MCV 90.8 02/06/2021 0334   MCV 87 04/26/2020 1316   MCH 29.7 02/06/2021 0334   MCHC 32.7 02/06/2021 0334   RDW 13.1 02/06/2021 0334   RDW 13.4 04/26/2020 1316   LYMPHSABS 1.4 02/05/2021 1503   MONOABS 0.6 02/05/2021 1503   EOSABS 0.2 02/05/2021 1503   BASOSABS 0.0 02/05/2021 1503   CMP     Component Value Date/Time   NA 139 02/06/2021 0334   NA 139 09/21/2020 1044   K 3.7 02/06/2021 0334   CL 103 02/06/2021 0334   CO2 28 02/06/2021 0334   GLUCOSE 115 (H) 02/06/2021 0334   BUN 20 02/06/2021 0334   BUN 22 09/21/2020 1044   CREATININE 0.72 02/06/2021 0334   CREATININE 0.90 01/12/2016 1544   CALCIUM 9.4 02/06/2021 0334   PROT 6.2 (L) 02/06/2021 0334   PROT 7.1 09/21/2020 1044   ALBUMIN 3.5 02/06/2021 0334   ALBUMIN 4.5 09/21/2020 1044   AST 26 02/06/2021 0334   ALT 27 02/06/2021 0334   ALKPHOS 61 02/06/2021 0334   BILITOT 0.4 02/06/2021 0334   BILITOT 0.3 09/21/2020 1044   GFRNONAA >60  02/06/2021 0334   GFRAA 96 04/26/2020 1316   Lab Results  Component Value Date   LDJTTSVX79 390 02/06/2021   Lab Results  Component Value Date   FOLATE 51.3 02/06/2021   Lab Results  Component Value Date   ESRSEDRATE 7 02/09/2021   RPR 11/22: Non Reactive NMO-IgG < 1.5 Bartonella negative HSV 1&2 DNA negative Toxoplasma  Pending labs: Serum lyme, ANA with IFA,   Imaging Reviewed:  CT angio head neck wwo with CT venogram 11/23: 1. No acute intracranial process. 2. No intracranial large vessel occlusion or significant stenosis. 3. No dural venous sinus thrombosis or stenosis. 4.  Partially imaged aortic arch, with unchanged postoperative appearance of the distal arch, better evaluated on 07/01/2019.  MRI head + MRI orbits wwo contrast 11/23: MRI head  1. No evidence of acute intracranial abnormality. 2. Moderate T2/FLAIR hyperintensities within the white matter, nonspecific but compatible with chronic microvascular ischemic disease. Sequela of chronic demyelination is a differential consideration.   MRI orbits: 1. Mild flattening of the posterior left globe, possibly related to reported papilledema. Otherwise, no acute orbital findings. 2. Absent left lens with small left anterior chamber, probably postsurgical or chronic in this patient with reported chronic left blindness. Recommend correlation with ophthalmologic history.  Assessment: 65 y.o. 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 Saw ophthalmologist and diagnosed with R optic neuritis with optic disc edema. Her neurologic examination is notable for R superior monocular hemi-visual field deficit.   MRI brain imaging is without enhancement of the R optic nerve to indicate optic neuritis and NMO labs are negative, though MRI imaging is not 100% sensitive. She has significant confluent white matter disease in the bilateral cerebral hemispheres without discrete lesions suggestive of demyelination and she does not give a history of acquired neurologic deficits transient or otherwise to suggest a longstanding history of MS. Suspect findings are 2/2 combination of chronic small vessel ischemic disease and potentially damage 2/2 congenital rubella (also responsible for her deafness and L eye blindness) although cumulative damage from previous  demyelination cannot be ruled out. I suspect her papilledema would have been bilateral but her L optic nerve was previously too damaged to swell. With seeming isolated right papilledema and right blurry vision, CT venogram and CT angio imaging were obtained without acute abnormality. Will pursue lumbar puncture with opening pressure and send CSF inflammatory markers. I suspect the papilledema is reflective of increased ICP and less likely optic neuritis. However, given that she is deaf and already blind in her L eye, losing vision permanently in her R eye would be catastrophic for ability to communicate and overall quality of life. As such, we will continue high-dose steroids to treat possible inflammatory etiologies while we pursue further workup.   Recommendations: - Follow up on pending labs  - LP unable to be completed at bedside and under IR due to severe scoliosis - ophthalmology consulted recommends transfer to a tertiary care center with a neuro-ophthalmology department like [facility] given that patient is already deaf and blind in her other eye. Losing vision in her R eye would have catastrophic effects on her ability to communicate and overall quality of life.. Request has been made to Primary team - ESR and CRP negative, can r/o GCA -completed 5 days of  high dose solumedrol -If unable to transfer may consider cervical level attempt to measure opening pressure  - Neurology will continue to follow  Ashley Gandy, NP   Neurology Attending Attestation   I examined the patient and discussed plan with Ms. Rogers Blocker NP. Above note has been edited by me to reflect my findings and recommendations. Primary team to request transfer to tertiary care hospital w/ neuro-ophthalmology today. If transfer requests denied, will discuss cervical assessment of opening pressure w/ NIR or NSU.   Ashley Monks, MD [facility] [phone]   If 7pm- 7am, please page neurology on call as listed in  [place].

## 2021-02-10 NOTE — Progress Notes (Signed)
PT Cancellation Note  Patient Details Name: Ashley Torres MRN: [id number] DOB: 1955-05-17   Cancelled Treatment:    Reason Eval/Treat Not Completed: PT screened, no needs identified, will sign off Patient is independent with mobility at this time. No skilled PT needs required acutely. Patient would benefit from [facility] and OT to assist with compensatory strategies. PT will sign off at this time.   Katalina Magri A. Dan Humphreys PT, DPT Acute Rehabilitation Services Pager [phone] Office [phone]    Viviann Spare 02/10/2021, 11:48 AM

## 2021-02-11 DIAGNOSIS — M419 Scoliosis, unspecified: Secondary | ICD-10-CM | POA: Diagnosis not present

## 2021-02-11 DIAGNOSIS — H469 Unspecified optic neuritis: Secondary | ICD-10-CM | POA: Diagnosis not present

## 2021-02-11 DIAGNOSIS — H903 Sensorineural hearing loss, bilateral: Secondary | ICD-10-CM | POA: Diagnosis not present

## 2021-02-11 DIAGNOSIS — Z7982 Long term (current) use of aspirin: Secondary | ICD-10-CM | POA: Diagnosis not present

## 2021-02-11 DIAGNOSIS — Q231 Congenital insufficiency of aortic valve: Secondary | ICD-10-CM | POA: Diagnosis not present

## 2021-02-11 DIAGNOSIS — H543 Unqualified visual loss, both eyes: Secondary | ICD-10-CM

## 2021-02-11 DIAGNOSIS — Z20822 Contact with and (suspected) exposure to covid-19: Secondary | ICD-10-CM | POA: Diagnosis not present

## 2021-02-11 DIAGNOSIS — Z79890 Hormone replacement therapy (postmenopausal): Secondary | ICD-10-CM | POA: Diagnosis not present

## 2021-02-11 DIAGNOSIS — K219 Gastro-esophageal reflux disease without esophagitis: Secondary | ICD-10-CM | POA: Diagnosis not present

## 2021-02-11 DIAGNOSIS — H5340 Unspecified visual field defects: Secondary | ICD-10-CM | POA: Diagnosis not present

## 2021-02-11 DIAGNOSIS — Z803 Family history of malignant neoplasm of breast: Secondary | ICD-10-CM | POA: Diagnosis not present

## 2021-02-11 DIAGNOSIS — F40240 Claustrophobia: Secondary | ICD-10-CM | POA: Diagnosis not present

## 2021-02-11 DIAGNOSIS — H4710 Unspecified papilledema: Secondary | ICD-10-CM | POA: Diagnosis not present

## 2021-02-11 DIAGNOSIS — Z79899 Other long term (current) drug therapy: Secondary | ICD-10-CM | POA: Diagnosis not present

## 2021-02-11 DIAGNOSIS — H5462 Unqualified visual loss, left eye, normal vision right eye: Secondary | ICD-10-CM | POA: Diagnosis not present

## 2021-02-11 DIAGNOSIS — Z8261 Family history of arthritis: Secondary | ICD-10-CM | POA: Diagnosis not present

## 2021-02-11 DIAGNOSIS — Z952 Presence of prosthetic heart valve: Secondary | ICD-10-CM | POA: Diagnosis not present

## 2021-02-11 DIAGNOSIS — Z825 Family history of asthma and other chronic lower respiratory diseases: Secondary | ICD-10-CM | POA: Diagnosis not present

## 2021-02-11 DIAGNOSIS — E669 Obesity, unspecified: Secondary | ICD-10-CM | POA: Diagnosis not present

## 2021-02-11 DIAGNOSIS — E785 Hyperlipidemia, unspecified: Secondary | ICD-10-CM | POA: Diagnosis not present

## 2021-02-11 DIAGNOSIS — H538 Other visual disturbances: Secondary | ICD-10-CM | POA: Diagnosis not present

## 2021-02-11 DIAGNOSIS — H9193 Unspecified hearing loss, bilateral: Secondary | ICD-10-CM | POA: Diagnosis not present

## 2021-02-11 DIAGNOSIS — I5032 Chronic diastolic (congestive) heart failure: Secondary | ICD-10-CM | POA: Diagnosis not present

## 2021-02-11 DIAGNOSIS — B948 Sequelae of other specified infectious and parasitic diseases: Secondary | ICD-10-CM | POA: Diagnosis not present

## 2021-02-11 DIAGNOSIS — I110 Hypertensive heart disease with heart failure: Secondary | ICD-10-CM | POA: Diagnosis not present

## 2021-02-11 DIAGNOSIS — E039 Hypothyroidism, unspecified: Secondary | ICD-10-CM | POA: Diagnosis not present

## 2021-02-11 LAB — CBC
HCT: 40.5 % (ref 36.0–46.0)
Hemoglobin: 13.5 g/dL (ref 12.0–15.0)
MCH: 29.4 pg (ref 26.0–34.0)
MCHC: 33.3 g/dL (ref 30.0–36.0)
MCV: 88.2 fL (ref 80.0–100.0)
Platelets: 238 10*3/uL (ref 150–400)
RBC: 4.59 MIL/uL (ref 3.87–5.11)
RDW: 12.9 % (ref 11.5–15.5)
WBC: 8.5 10*3/uL (ref 4.0–10.5)
nRBC: 0 % (ref 0.0–0.2)

## 2021-02-11 LAB — BASIC METABOLIC PANEL
Anion gap: 8 (ref 5–15)
BUN: 31 mg/dL — ABNORMAL HIGH (ref 8–23)
CO2: 27 mmol/L (ref 22–32)
Calcium: 9.1 mg/dL (ref 8.9–10.3)
Chloride: 105 mmol/L (ref 98–111)
Creatinine, Ser: 0.72 mg/dL (ref 0.44–1.00)
GFR, Estimated: >60 mL/min (ref >60)
Glucose, Bld: 122 mg/dL — ABNORMAL HIGH (ref 70–99)
Potassium: 3.4 mmol/L — ABNORMAL LOW (ref 3.5–5.1)
Sodium: 140 mmol/L (ref 135–145)

## 2021-02-11 LAB — RESP PANEL BY RT-PCR (FLU A&B, COVID) ARPGX2
Influenza A by PCR: NEGATIVE
Influenza B by PCR: NEGATIVE
SARS Coronavirus 2 by RT PCR: NEGATIVE

## 2021-02-11 MED ORDER — SIMETHICONE 80 MG PO CHEW
80.0000 mg | CHEWABLE_TABLET | Freq: Once | ORAL | Status: AC
  Administered 2021-02-11: 21:00:00 80 mg via ORAL
  Filled 2021-02-11: qty 1

## 2021-02-11 MED ORDER — CALCIUM CARBONATE ANTACID 500 MG PO CHEW
400.0000 mg | CHEWABLE_TABLET | Freq: Three times a day (TID) | ORAL | Status: DC | PRN
  Administered 2021-02-11 (×2): 400 mg via ORAL
  Filled 2021-02-11 (×3): qty 2

## 2021-02-11 NOTE — Discharge Summary (Signed)
[facility] Discharge Summary  Patient name: Ashley Torres Medical record number: [id number] Date of birth: 10-07-1955 Age: 65 y.o. Gender: female Date of Admission: 02/05/2021  Date of Discharge: 02/11/2021 Admitting Physician: Vianne Bulls, MD  Primary Care Provider: Orvis Brill, DO Consultants: Neurology, Ophthalmology, Interventional Radiology  Indication for Hospitalization: Blurry vision  Discharge Diagnoses/Problem List:  Right papilledema Hypertension Hypothyroidism Hyperlipidemia Chronic diastolic heart failure Heart murmur Vision loss: Left eye Congenital deafness History of congenital rubella GERD Scoliosis Bicuspid aortic valve   Disposition: transfer to [facility]-Neurology service   Discharge Condition: Improving   Discharge Exam:  General: well-appearing female in no acute distress Eyes: pupils round, reactive to light, no injection, lids normal  Cardiovascular: systolic murmur radiating throughout chest,  Respiratory: clear to ausculation bilaterally  Abdomen: soft, non tender  Extremities: normal ROM, no edema  Brief Hospital Course:  Ashley Torres is 65 y.o. female who presented to [facility] for increased lacrimation and blurred vision from the right eye after ophthalmology referred her to the ED after evaluation on 1118.  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  Below is her hospital course listed by problem.  Refer to the H&P for additional information.  Right papilledema Symptoms had started on 11/16.  She had seen an ophthalmologist on 11/18 and was found to have papilledema and referred to the ED.  She did not present to the ED until 11/21 as her symptoms persisted.  In  the ED, CT head notable for low density in the anterior limb of the right external capsule likely representing chronic ischemia, additionally with chronic small vessel ischemia moderate in degree.  CBC and BMP without abnormalities.  Neurology was consulted in the ED and recommended MRI brain and orbits with and without contrast.  MRI showed mild flattening of the posterior left globe without acute orbital findings and chronic microvascular ischemic disease.  CTA and CT Venogram without acute intracranial process, no intracranial vessel occlusion or significant stenosis. Neuromyelitis IgG negative, Bartonella antibody panel negative, toxoplasma negative, lyme disease panel was notable for IgG P41 antibody positive. Risk stratification labs notable for LDL 49 and Hgb A1c 5.2 02/06/21.   Neurology recommended lumbar puncture, but bedside in IR LP was unsuccessful due to patient's severe scoliosis.  Measurement of opening pressure and weighted required cervical level attempt by neurosurgery or neuro IR.  Ophthalmology was consulted and confirmed right optic nerve edema.  Recommended LP and CRP (0.6) and ESR (admission 3, repeat 7) which ruled out giant cell arteritis. Ophthalmology and neurology conferred that transfer to a tertiary care center with a neuro-ophthalmology department is an necessity given that patient is already deaf and blind in her left eye. Losing vision in her right eye would have catastrophic effects on her ability to communicate and overall quality of life.  She has completed 5-day course of IV Solu-Medrol 1000 mg as treatment for inflammatory etiology.    Pt was accepted to the [facility] neurology service, attending Dr. Teressa Senter on 02/11/21.    Home medications were continued for other chronic diseases which remained stable.     Issues for Follow Up:  Pt transferred to [facility] Neurology Service for further evaluation with neuro-ophthalmology   Significant Procedures:  Lumbar  puncture: Unsuccessful   Significant Labs and Imaging:  Recent Labs  Lab 02/05/21 1503 02/06/21 0334 02/11/21 0228  WBC 6.3 6.2 8.5  HGB 13.9 13.6 13.5  HCT 43.0 41.6 40.5  PLT 247 217 238   Recent Labs  Lab 02/05/21 1503 02/06/21 0334 02/11/21 0228  NA 142 139 140  K 3.8 3.7 3.4*  CL 103 103 105  CO2 29 28 27  GLUCOSE 123* 115* 122*  BUN 21 20 31*  CREATININE 0.75 0.72 0.72  CALCIUM 9.6 9.4 9.1  ALKPHOS  --  61  --   AST  --  26  --   ALT  --  27  --   ALBUMIN  --  3.5  --     CT ANGIO HEAD NECK W WO CM  Result Date: 02/07/2021 CLINICAL DATA:  Right eye blurry vision, papilledema, concern for increased intracranial pressure, dural venous sinus thrombosis suspected. EXAM: CT ANGIOGRAPHY HEAD AND NECK CT VENOGRAM HEAD TECHNIQUE: Multidetector CT imaging of the head and neck was performed using the standard protocol during bolus administration of intravenous contrast. Multiplanar CT image reconstructions and MIPs were obtained to evaluate the vascular anatomy. Carotid stenosis measurements (when applicable) are obtained utilizing NASCET criteria, using the distal internal carotid diameter as the denominator. Venographic phase images of the brain were obtained following the administration of intravenous contrast. Multiplanar reformats and maximum intensity projections were generated. CONTRAST:  75mL OMNIPAQUE IOHEXOL 350 MG/ML SOLN COMPARISON:  02/05/2021 CT head, 02/07/2021 MRI head, no prior CTA or CT the. FINDINGS: CT HEAD FINDINGS Brain: No acute infarct, hemorrhage, mass, mass effect, or midline shift. No extra-axial collection or hydrocephalus. Ventricles and sulci are within normal limits for age. Periventricular white matter changes, likely the sequela of chronic small vessel ischemic disease. Vascular: No hyperdense vessel. Skull: No acute osseous abnormality. Sinuses: Imaged portions are clear. Orbits: Absent left lens, which may be postsurgical. The orbits are otherwise  unremarkable. Review of the MIP images confirms the above findings CTA NECK FINDINGS Aortic arch: Status post coarctation repair, with overall unchanged appearance of the imaged portions of the aortic arch compared to 07/01/2019, with narrowing of the distal arch and small superior outpouching. 2 vessel arch with a common origin of the brachiocephalic and left common carotid arteries. No evidence of aneurysm or dissection. Right carotid system: No evidence of dissection, stenosis (50% or greater) or occlusion. Left carotid system: No evidence of dissection, stenosis (50% or greater) or occlusion. Vertebral arteries: Left dominant. No evidence of dissection, stenosis (50% or greater) or occlusion. Skeleton: Degenerative changes in the cervical spine. No acute osseous abnormality. Other neck: Negative Upper chest: Linear atelectasis and/or scarring. No focal pulmonary opacity. No pleural effusion. Review of the MIP images confirms the above findings CTA HEAD FINDINGS Anterior circulation: Both internal carotid arteries are patent to the termini, without stenosis or other abnormality. A1 segments patent. Normal anterior communicating artery. Anterior cerebral arteries are patent to their distal aspects. No M1 stenosis or occlusion. Normal MCA bifurcations. Distal MCA branches perfused and symmetric. Posterior circulation: Vertebral arteries patent to the vertebrobasilar junction without stenosis. Posterior inferior cerebral arteries patent bilaterally. Basilar patent to its distal aspect. Superior cerebellar arteries patent bilaterally. PCAs perfused to their distal aspects without focal stenosis, although distal branches are diminutive. The posterior communicating arteries are not definitively visualized. Venous sinuses: As permitted by contrast timing, patent. Anatomic variants: None significant Review of the MIP images confirms the above findings CT VENOGRAM FINDINGS No dural venous sinus thrombosis or stenosis.  IMPRESSION: 1. No acute intracranial process.   2. No intracranial large vessel occlusion or significant stenosis. 3. No dural venous sinus thrombosis or stenosis. 4. Partially imaged aortic arch, with unchanged postoperative appearance of the distal arch, better evaluated on 07/01/2019. Electronically Signed   By: Alison  Vasan M.D.   On: 02/07/2021 20:55   CT HEAD WO CONTRAST (5MM)  Result Date: 02/05/2021 CLINICAL DATA:  Neuro deficit, acute, stroke suspected EXAM: CT HEAD WITHOUT CONTRAST TECHNIQUE: Contiguous axial images were obtained from the base of the skull through the vertex without intravenous contrast. COMPARISON:  None. FINDINGS: Brain: No acute intracranial hemorrhage. Brain volume is normal for age. Periventricular and deep white matter hypodensity is at least moderate in degree. Low-density in the anterior limb of the right external capsule likely represents chronic ischemia, but is technically indeterminate in age. No midline shift, mass effect, or evidence of mass lesion. There is no hydrocephalus. No subdural or extra-axial collection. Vascular: No hyperdense vessel or unexpected calcification. Skull: No fracture or focal lesion. Sinuses/Orbits: No acute finding. Other: None. IMPRESSION: 1. Low-density in the anterior limb of the right external capsule likely represents chronic ischemia, but is technically indeterminate in age. 2. Periventricular and deep white matter hypodensity, moderate in degree, typical of chronic small vessel ischemia. Electronically Signed   By: Melanie  Sanford M.D.   On: 02/05/2021 22:44   MR Brain W and Wo Contrast  Result Date: 02/07/2021 CLINICAL DATA:  papilladema; Vision loss, monocular EXAM: MRI HEAD AND ORBITS WITHOUT AND WITH CONTRAST TECHNIQUE: Multiplanar, multiecho pulse sequences of the brain and surrounding structures were obtained without and with intravenous contrast. Multiplanar, multiecho pulse sequences of the orbits and surrounding structures  were obtained including fat saturation techniques, before and after intravenous contrast administration. CONTRAST:  7mL GADAVIST GADOBUTROL 1 MMOL/ML IV SOLN COMPARISON:  None. FINDINGS: MRI HEAD FINDINGS Brain: No acute infarction, hemorrhage, hydrocephalus, extra-axial collection or mass lesion. Moderate patchy and confluent T2/FLAIR hyperintensity in the supratentorial and pontine white matter. No abnormal enhancement. Vascular: Major arterial flow voids are maintained at the skull base. Skull and upper cervical spine: Normal marrow signal. Other: No mastoid effusions MRI ORBITS FINDINGS Orbits: No traumatic or inflammatory finding. Optic nerves, orbital fat, extraocular muscles, vascular structures, and lacrimal glands are normal. Unremarkable right globe. Absent lens and small anterior chamber on the left. No abnormal enhancement of the optic nerves. Visualized sinuses: Mild ethmoid air cell and frontal sinus mucosal thickening otherwise, clear sinuses. Soft tissues: Negative. IMPRESSION: MRI head 1. No evidence of acute intracranial abnormality. 2. Moderate T2/FLAIR hyperintensities within the white matter, nonspecific but compatible with chronic microvascular ischemic disease. Sequela of chronic demyelination is a differential consideration. MRI orbits: 1. Mild flattening of the posterior left globe, possibly related to reported papilledema. Otherwise, no acute orbital findings. 2. Absent left lens with small left anterior chamber, probably postsurgical or chronic in this patient with reported chronic left blindness. Recommend correlation with ophthalmologic history. Electronically Signed   By: Frederick S Jones M.D.   On: 02/07/2021 12:45   CT VENOGRAM HEAD  Result Date: 02/07/2021 CLINICAL DATA:  Right eye blurry vision, papilledema, concern for increased intracranial pressure, dural venous sinus thrombosis suspected. EXAM: CT ANGIOGRAPHY HEAD AND NECK CT VENOGRAM HEAD TECHNIQUE: Multidetector CT  imaging of the head and neck was performed using the standard protocol during bolus administration of intravenous contrast. Multiplanar CT image reconstructions and MIPs were obtained to evaluate the vascular anatomy. Carotid stenosis measurements (when applicable) are obtained utilizing NASCET criteria, using the distal internal carotid   diameter as the denominator. Venographic phase images of the brain were obtained following the administration of intravenous contrast. Multiplanar reformats and maximum intensity projections were generated. CONTRAST:  75mL OMNIPAQUE IOHEXOL 350 MG/ML SOLN COMPARISON:  02/05/2021 CT head, 02/07/2021 MRI head, no prior CTA or CT the. FINDINGS: CT HEAD FINDINGS Brain: No acute infarct, hemorrhage, mass, mass effect, or midline shift. No extra-axial collection or hydrocephalus. Ventricles and sulci are within normal limits for age. Periventricular white matter changes, likely the sequela of chronic small vessel ischemic disease. Vascular: No hyperdense vessel. Skull: No acute osseous abnormality. Sinuses: Imaged portions are clear. Orbits: Absent left lens, which may be postsurgical. The orbits are otherwise unremarkable. Review of the MIP images confirms the above findings CTA NECK FINDINGS Aortic arch: Status post coarctation repair, with overall unchanged appearance of the imaged portions of the aortic arch compared to 07/01/2019, with narrowing of the distal arch and small superior outpouching. 2 vessel arch with a common origin of the brachiocephalic and left common carotid arteries. No evidence of aneurysm or dissection. Right carotid system: No evidence of dissection, stenosis (50% or greater) or occlusion. Left carotid system: No evidence of dissection, stenosis (50% or greater) or occlusion. Vertebral arteries: Left dominant. No evidence of dissection, stenosis (50% or greater) or occlusion. Skeleton: Degenerative changes in the cervical spine. No acute osseous abnormality.  Other neck: Negative Upper chest: Linear atelectasis and/or scarring. No focal pulmonary opacity. No pleural effusion. Review of the MIP images confirms the above findings CTA HEAD FINDINGS Anterior circulation: Both internal carotid arteries are patent to the termini, without stenosis or other abnormality. A1 segments patent. Normal anterior communicating artery. Anterior cerebral arteries are patent to their distal aspects. No M1 stenosis or occlusion. Normal MCA bifurcations. Distal MCA branches perfused and symmetric. Posterior circulation: Vertebral arteries patent to the vertebrobasilar junction without stenosis. Posterior inferior cerebral arteries patent bilaterally. Basilar patent to its distal aspect. Superior cerebellar arteries patent bilaterally. PCAs perfused to their distal aspects without focal stenosis, although distal branches are diminutive. The posterior communicating arteries are not definitively visualized. Venous sinuses: As permitted by contrast timing, patent. Anatomic variants: None significant Review of the MIP images confirms the above findings CT VENOGRAM FINDINGS No dural venous sinus thrombosis or stenosis. IMPRESSION: 1. No acute intracranial process. 2. No intracranial large vessel occlusion or significant stenosis. 3. No dural venous sinus thrombosis or stenosis. 4. Partially imaged aortic arch, with unchanged postoperative appearance of the distal arch, better evaluated on 07/01/2019. Electronically Signed   By: Alison  Vasan M.D.   On: 02/07/2021 20:55   DG FL GUIDED LUMBAR PUNCTURE  Result Date: 02/09/2021 CLINICAL DATA:  Papilledema EXAM: DIAGNOSTIC LUMBAR PUNCTURE UNDER FLUOROSCOPIC GUIDANCE COMPARISON:  None FLUOROSCOPY TIME:  Fluoroscopy Time:  42 seconds Radiation Exposure Index (if provided by the fluoroscopic device): 12 mGy PROCEDURE: Informed consent was obtained from the patient prior to the procedure, including potential complications of headache, allergy, and  pain. With the patient prone, the lower back was prepped with Betadine. 1% Lidocaine was used for local anesthesia. Lumbar puncture was attempted first by Shannon Watterson, PA and then by Praneil Patel, MD at the L4-L5 level using a 20 gauge needle. After multiple unsuccessful attempts in the setting of scoliosis, degenerative changes, and no available cross sectional spine imaging, procedure was aborted. There were no apparent complications. IMPRESSION: Unsuccessful fluoroscopic guided lumbar puncture. Electronically Signed   By: Praneil  Patel M.D.   On: 02/09/2021 09:51   MR ORBITS W   WO CONTRAST  Result Date: 02/07/2021 CLINICAL DATA:  papilladema; Vision loss, monocular EXAM: MRI HEAD AND ORBITS WITHOUT AND WITH CONTRAST TECHNIQUE: Multiplanar, multiecho pulse sequences of the brain and surrounding structures were obtained without and with intravenous contrast. Multiplanar, multiecho pulse sequences of the orbits and surrounding structures were obtained including fat saturation techniques, before and after intravenous contrast administration. CONTRAST:  7mL GADAVIST GADOBUTROL 1 MMOL/ML IV SOLN COMPARISON:  None. FINDINGS: MRI HEAD FINDINGS Brain: No acute infarction, hemorrhage, hydrocephalus, extra-axial collection or mass lesion. Moderate patchy and confluent T2/FLAIR hyperintensity in the supratentorial and pontine white matter. No abnormal enhancement. Vascular: Major arterial flow voids are maintained at the skull base. Skull and upper cervical spine: Normal marrow signal. Other: No mastoid effusions MRI ORBITS FINDINGS Orbits: No traumatic or inflammatory finding. Optic nerves, orbital fat, extraocular muscles, vascular structures, and lacrimal glands are normal. Unremarkable right globe. Absent lens and small anterior chamber on the left. No abnormal enhancement of the optic nerves. Visualized sinuses: Mild ethmoid air cell and frontal sinus mucosal thickening otherwise, clear sinuses. Soft  tissues: Negative. IMPRESSION: MRI head 1. No evidence of acute intracranial abnormality. 2. Moderate T2/FLAIR hyperintensities within the white matter, nonspecific but compatible with chronic microvascular ischemic disease. Sequela of chronic demyelination is a differential consideration. MRI orbits: 1. Mild flattening of the posterior left globe, possibly related to reported papilledema. Otherwise, no acute orbital findings. 2. Absent left lens with small left anterior chamber, probably postsurgical or chronic in this patient with reported chronic left blindness. Recommend correlation with ophthalmologic history. Electronically Signed   By: Frederick S Jones M.D.   On: 02/07/2021 12:45         Results/Tests Pending at Time of Discharge:   ANA, IFA (with reflex)    Discharge Medications:  Allergies as of 02/11/2021       Reactions   Statins    Upset stomach   Atorvastatin Other (See Comments)   Stomach upset   Erythromycin    Pt can not remember ? Maybe swelling and itching   Furosemide    Hydrocodone-acetaminophen Itching, Nausea And Vomiting   Meloxicam    Nausea, vomiting   Metoprolol Nausea And Vomiting   Montelukast    Increased head aches   Omeprazole    Never took medication but read about "dangerous side effects"   Potassium Chloride    Rosuvastatin    Myalgias/stomach upset        Medication List     TAKE these medications    acetaminophen 500 MG tablet Commonly known as: TYLENOL Take 500 mg by mouth daily as needed for moderate pain.   albuterol 108 (90 Base) MCG/ACT inhaler Commonly known as: VENTOLIN HFA INHALE 2 PUFFS INTO THE LUNGS EVERY 4 HOURS AS NEEDED FOR WHEEZING OR SHORTNESS OF BREATH What changed:  how to take this Another medication with the same name was removed. Continue taking this medication, and follow the directions you see here.   amoxicillin 500 MG tablet Commonly known as: AMOXIL TAKE 4 TABLETS BY MOUTH 30-60 MINS PRIOR TO DENTAL  PROCEDURES What changed:  how much to take how to take this when to take this   aspirin EC 81 MG tablet Take 81 mg by mouth daily.   Calcium Carb-Cholecalciferol 600-1000 MG-UNIT Caps Take 1 tablet by mouth daily.   calcium carbonate 500 MG chewable tablet Commonly known as: TUMS - dosed in mg elemental calcium Chew 1 tablet by mouth daily as needed for indigestion.     chlorpheniramine 4 MG tablet Commonly known as: CHLOR-TRIMETON Take 4 mg by mouth daily as needed for allergies.   famotidine 10 MG tablet Commonly known as: PEPCID Take 1 tablet (10 mg total) by mouth daily.   fexofenadine 180 MG tablet Commonly known as: ALLEGRA Take 180 mg by mouth daily as needed for allergies.   FOLIC ACID PO Take 1 tablet by mouth daily.   GLYCERIN-HYPROMELLOSE-PEG 400 OP Place 1 drop into both eyes daily as needed (Dry eyes).   hydrochlorothiazide 25 MG tablet Commonly known as: HYDRODIURIL TAKE 1 TABLET(25 MG) BY MOUTH DAILY What changed: See the new instructions.   levothyroxine 100 MCG tablet Commonly known as: SYNTHROID TAKE 1 TABLET BY MOUTH DAILY BEFORE BREAKFAST   lisinopril 10 MG tablet Commonly known as: ZESTRIL TAKE 1 TABLET BY MOUTH EVERY DAY   meclizine 25 MG tablet Commonly known as: ANTIVERT Take 1 tablet (25 mg total) by mouth 3 (three) times daily as needed for dizziness.   OCUVITE PRESERVISION PO Take 1 tablet by mouth daily.   MULTIVITAMIN ADULTS 50+ PO Take 1 tablet by mouth daily.   Praluent 75 MG/ML Soaj Generic drug: Alirocumab Inject 1 pen into the skin every 14 (fourteen) days.   Repatha SureClick 140 MG/ML Soaj Generic drug: Evolocumab Inject 140 mg into the skin every 14 (fourteen) days.   vitamin C 250 MG tablet Commonly known as: ASCORBIC ACID Take 250 mg by mouth daily.   Vitamin E 100 units Tabs Take 100 Units by mouth daily.        Discharge Instructions: Please refer to Patient Instructions section of EMR for full details.   Patient was counseled important signs and symptoms that should prompt return to medical care, changes in medications, dietary instructions, activity restrictions, and follow up appointments.   Follow-Up Appointments:   Brimage, Vondra, DO 02/11/2021 [facility]  

## 2021-02-11 NOTE — Progress Notes (Signed)
Patient discharged to [facility]. Patient transported by [facility]. Patient alert and oriented, vital signs obtained before transport, BP=160/78, MAP=103,TP=98.3,RP=20 O 2=96% Reported called in to the receiving facility. All personal belongings transferred with patient.

## 2021-02-11 NOTE — Progress Notes (Signed)
I have interviewed and examined the patient.  I have discussed the case with Dr. Rachael Darby.   I agree with their documentation and management in their note for today.    LOS: 3 days    Status is: Remains inpatient appropriate because: ongoing IV cotricosteroid therapy and management of potentially catastrophic condition from patient going completely blind in the setting of congenital deafness.   Dispo: The patient is from: Home             Anticipated Date of Discharge: ?             Anticipated d/c is to:  ?                                     For questions or updates, please contact Family Medicine Teaching Service Resident On-call at pager [phone] or  [email or web address] - see pager "CALL FOR HOSPITALIZED PATIENTS"

## 2021-02-11 NOTE — Progress Notes (Signed)
FPTS Brief Progress Note  S:Patient awake. Spoke at length. She has heart burn and requests tums and ginger ale. Also asking to go to gift shop to buy new clothes, she is uncomfortable wearing the hospital gown.   O: BP (!) 128/45 (BP Location: Right Arm)   Pulse 76   Temp 98.4 F (36.9 C) (Oral)   Resp 18   LMP  (LMP Unknown)   SpO2 92%     A/P: Blurred Vision Rt Eye  papilledema  optic neuritis  Day team unable to request transfer. Will try day time Sunday. Plan to transfer to tertiary hospital with neuro-ophthalmology such as [facility] or [facility].   Hypertension Normotensive this evening. No changes to medications at this time.   PT and OT  PT saw patient Saturday, signed off as patient is fully independent and ambulatory. OT has not seen patient yet despite order being placed 11/25.  - new OT eval and treat order - consult TOC order to facilitate connection with services for the blind   - Order placed to allow her off unit to gift shop with assistance - Orders reviewed. Labs for AM ordered, which was adjusted as needed.   Fayette Pho, MD 02/11/2021, 12:44 AM PGY-2, [facility] Night Resident  Please page [phone] with questions.

## 2021-02-11 NOTE — Progress Notes (Addendum)
There is no neuro-ophthalmologist on call this weekend to speak with at [facility]. Spoke with Dr. Marko Stai with ophthalmology at [facility] who would consult on patient if she is transferred.  Dr. Janee Morn recommended consulting the medicine service for admission but noted that the medicine service is full today and we will need to re-consult [facility] tomorrow.   Addendum: Spoke with Astrid Drafts, [facility]ist, chief resident, who is unsure what their team would do differently besides obtaining a LP. She is to speak with her attending and ophthalmology then return my call.   Addendum: Spoke with PAL line for [facility] who reports medicine service is full and is not accepting transfers. Spoke with on-call ophthalmologist, Dr. Marin Shutter who stated she would consult if the patient was able to transfer.   Addendum: Attending, Dr Alver Fisher, [facility] has accepted Ashley Torres to their service without speaking to ophthalmology. They will coordinate with [facility] when she has a bed and [facility] to arrange transport when bed is available.     Katha Cabal, DO PGY-3, [facility] 02/11/2021

## 2021-02-11 NOTE — Evaluation (Addendum)
Occupational Therapy Evaluation Patient Details Name: Ashley Torres MRN: [id number] DOB: 1955/08/18 Today's Date: 02/11/2021   History of Present Illness 65 yo female presenting to ED on 11/21 with vision loss at R eye.  CT and MRI on 11/23 negative for acute changes. MRI orbits showing mild flattening of posterior left globe and no acute findings.  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   Clinical Impression   PTA, pt was living alone and was independent with ADLs and IADLs; has set up assistance for transportation to perform out of home IADLs.  Pt currently performing at her baseline function. Reporting blurriness at R eye but able to see for ADLs and functional mobility. Provided pt with handout and information on Services for the Blind; pt stated understanding. Recommend dc to home once medically stable per physician. Answered all questions and all acute OT needs met. Will sign off.      Recommendations for follow up therapy are one component of a multi-disciplinary discharge planning process, led by the attending physician.  Recommendations may be updated based on patient status, additional functional criteria and insurance authorization.   Follow Up Recommendations  No OT follow up    Assistance Recommended at Discharge None  Functional Status Assessment  Patient has had a recent decline in their functional status and demonstrates the ability to make significant improvements in function in a reasonable and predictable amount of time.  Equipment Recommendations  None recommended by OT    Recommendations for Other Services       Precautions / Restrictions Precautions Precautions: Other (comment) Precaution Comments: Baseline blind in L eye. Acute blindness in R  eye.      Mobility Bed Mobility Overal bed mobility: Independent                  Transfers Overall transfer level: Independent                        Balance Overall balance assessment: Independent                                         ADL either performed or assessed with clinical judgement   ADL Overall ADL's : At baseline                                             Vision         Perception     Praxis      Pertinent Vitals/Pain Pain Assessment: Faces Faces Pain Scale: Hurts little more Pain Location: L abdomen Pain Descriptors / Indicators: Discomfort Pain Intervention(s): Monitored during session;Limited activity within patient's tolerance;Repositioned     Hand Dominance Right   Extremity/Trunk Assessment Upper Extremity Assessment Upper Extremity Assessment: Overall WFL for tasks assessed   Lower Extremity Assessment Lower Extremity Assessment: Overall WFL for tasks assessed   Cervical / Trunk Assessment Cervical / Trunk Assessment: Other exceptions Cervical / Trunk Exceptions: chronic back pain   Communication Communication Communication: Deaf;Prefers language other than English (ADLs)   Cognition Arousal/Alertness: Awake/alert Behavior During Therapy: WFL for tasks assessed/performed Overall Cognitive Status:  Within Functional Limits for tasks assessed                                       General Comments  Provided handout for services for the blind    Exercises     Shoulder Instructions      Home Living Family/patient expects to be discharged to:: Private residence Living Arrangements: Alone Available Help at Discharge: Family;Friend(s);Available PRN/intermittently Type of Home: Apartment (First floor) Home Access: Level entry;Elevator     Home Layout: One level     Bathroom Shower/Tub: Teacher, early years/pre: Standard     Home Equipment:  Shower seat          Prior Functioning/Environment Prior Level of Function : Independent/Modified Independent               ADLs Comments: ADLs and IADLs. has people who drive her to store. Not working since back problems/pain        OT Problem List: Impaired vision/perception      OT Treatment/Interventions:      OT Goals(Current goals can be found in the care plan section) Acute Rehab OT Goals Patient Stated Goal: go home OT Goal Formulation: All assessment and education complete, DC therapy  OT Frequency:     Barriers to D/C:            Co-evaluation              AM-PAC OT "6 Clicks" Daily Activity     Outcome Measure Help from another person eating meals?: None Help from another person taking care of personal grooming?: None Help from another person toileting, which includes using toliet, bedpan, or urinal?: None Help from another person bathing (including washing, rinsing, drying)?: None Help from another person to put on and taking off regular upper body clothing?: None Help from another person to put on and taking off regular lower body clothing?: None 6 Click Score: 24   End of Session Nurse Communication: Mobility status  Activity Tolerance: Patient tolerated treatment well Patient left: in chair;with call bell/phone within reach  OT Visit Diagnosis: Low vision, both eyes (H54.2)                Time: 3086-5784 OT Time Calculation (min): 36 min Charges:  OT General Charges $OT Visit: 1 Visit OT Evaluation $OT Eval Low Complexity: 1 Low OT Treatments $Self Care/Home Management : 8-22 mins  Recia Sons MSOT, OTR/L Acute Rehab Pager: [phone] Office: [place] 02/11/2021, 10:30 AM

## 2021-02-11 NOTE — Progress Notes (Signed)
Family Medicine Teaching Service Daily Progress Note Intern Pager: [phone]  Patient name: Ashley Torres Medical record number: [id number] Date of birth: November 04, 1955 Age: 65 y.o. Gender: female  Primary Care Provider: Orvis Brill, DO Consultants: Neurology, ophthalmology, IR  Code Status: Full  Pt Overview and Major Events to Date:  02/05/21: Admitted  02/09/21: Attempted LP however unsuccessful due to scoliosis 02/10/21: Completed 5-day course of IV steroids   Assessment and Plan: Ashley Torres is a 65 y.o. 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   Right optic nerve edema Ophthalmology was unable to rule out bilateral optic nerve involvement.  Patient with known history of congenital rubella vision loss to her left eye.  Not likely temporal arteritis given negative CRP and ESR.  Patient has completed 5-day course of high-dose IV steroids.  Ophthalmology and neurology recommended transfer to a tertiary center as loss to her right eye would have catastrophic consequences. We we will seek transfer today.  -Neurology following, appreciate recommendation -Ophthalmology consulted, appreciate recommendations -Status post 5-day course 1g Solu-Medrol -PT: Recommended services for the blind -OT: Eval and treat   Hypertension Normotensive.  -Continue hydralazine 25 mg Q6H BP >180 / >100 -Continue HCTZ 25 mg daily -Continue lisinopril 10 mg daily  Hypothyroidism  -Continue Synthroid 100 mcg daily  Hyperlipidemia -Repatha outpatient  Chronic diastolic heart failure EF 60 to 65%.  Euvolemic -Monitor fluid status  FEN/GI: Regular diet, replete electrolytes as needed PPx: SCDs   Disposition: for acute  stabilization or possible transfer to tertiary facility   Subjective:  No significant overnight events.   Objective: Temp:  [97.8 F (36.6 C)-98.8 F (37.1 C)] 98.8 F (37.1 C) (11/27 0802) Pulse Rate:  [68-93] 86 (11/27 0802) Resp:  [18-19] 18 (11/27 0802) BP: (128-173)/(45-90) 161/80 (11/27 0802) SpO2:  [92 %-99 %] 92 % (11/27 0802)   Physical Exam: General: well appearing female in no acute distress Cardiovascular: systolic murmur radiating throughout chest,  Respiratory: clear to ausculation bilaterally  Abdomen: soft, non tender  Extremities: normal ROM, no edema   Laboratory: Recent Labs  Lab 02/05/21 1503 02/06/21 0334 02/11/21 0228  WBC 6.3 6.2 8.5  HGB 13.9 13.6 13.5  HCT 43.0 41.6 40.5  PLT 247 217 238   Recent Labs  Lab 02/05/21 1503 02/06/21 0334 02/11/21 0228  NA 142 139 140  K 3.8 3.7 3.4*  CL 103 103 105  CO2 _0 BUN 21 20 31*  CREATININE 0.75 0.72 0.72  CALCIUM 9.6 9.4 9.1  PROT  --  6.2*  --   BILITOT  --  0.4  --   ALKPHOS  --  61  --   ALT  --  27  --   AST  --  26  --   GLUCOSE 123* 115* 122*      Imaging/Diagnostic Tests: No results found.   Ashley Hensen, DO 02/11/2021, 8:46 AM PGY-3, [place] Intern pager: [phone], text pages welcome

## 2021-02-11 NOTE — Progress Notes (Addendum)
Neurology Progress Note  Brief HPI: 65 y.o. female with PMHx of aphakia of her left eye, congenital rubella with deafness and blindness in the left eye, GERD, hypothyroidism, obesity, bicuspid aortic valve s/p biprosthetic valve replacement, chronic diastolic CHF, HLD, HTN who presented to the ED 11/21 for evaluation of right eye blurred vision since Wednesday, 11/16, initially noticing increased lacrimation and eye pain with movement. Her blurred vision was intermittent without progression over 3 days prior to worsening. She saw an ophthalmologist on 11/18 with elevation of the optic disc in the right eye consistent with papilledema of the right optic nerve and she was recommended to come to the ED for MRI brain and orbit imaging. Imaging delayed due to patient's severe claustrophobia.   Subjective: No acute overnight events. Spoke with patient via ASL interpretor. She states her vision is about the same as yesterday. Objects are still blurry and still the most difficult with reading and using her phone, however seems to wax and wane. She is very anxious to find out if she will be transferring her to another hospital. She also wants to go to the gift shop to buy clothes because she does not have any extras and can not get ahold of her friend to bring her some up. I Informed her that the primary team is working on trying to get her transferred to a hospital with neuro opthalmology and as soon as we hear something we will let her know.  Exam: Vitals:   02/11/21 0420 02/11/21 0802  BP: (!) 153/78 (!) 161/80  Pulse: 68 86  Resp: 18 18  Temp: 97.9 F (36.6 C) 98.8 F (37.1 C)  SpO2: 95% 92%   Gen: Sitting up comfortably in bed, in no acute distress Resp: non-labored breathing, no respiratory distress, on room air Abd: soft, non-tender, non-distended  Neuro: Mental Status: Awake, alert, and oriented x 4.  Fluently communicates via ASL video interpreter, comprehension is intact.  No neglect is  noted. Cranial Nerves: Left eye blindness (chronic), right pupil is round and reactive to light. EOMI without ptosis, gaze preference, or nystagmus. Face is symmetric resting and with movement. Patient is deaf from birth.  Motor: Spontaneously moves all extremities with antigravity movement 5/5 strength throughout.  Normal tone and bulk.  Sensory:Intact and symmetric to light touch throughout each extremity.  Gait: Deferred   Lab Results  Component Value Date   VITAMINB12 668 02/06/2021   Lab Results  Component Value Date   FOLATE 51.3 02/06/2021   Lab Results  Component Value Date   ESRSEDRATE 7 02/09/2021     Imaging Reviewed:  CT angio head neck wwo with CT venogram 11/23: 1. No acute intracranial process. 2. No intracranial large vessel occlusion or significant stenosis. 3. No dural venous sinus thrombosis or stenosis. 4. Partially imaged aortic arch, with unchanged postoperative appearance of the distal arch, better evaluated on 07/01/2019.  MRI head + MRI orbits wwo contrast 11/23: MRI head  1. No evidence of acute intracranial abnormality. 2. Moderate T2/FLAIR hyperintensities within the white matter, nonspecific but compatible with chronic microvascular ischemic disease. Sequela of chronic demyelination is a differential consideration.   MRI orbits: 1. Mild flattening of the posterior left globe, possibly related to reported papilledema. Otherwise, no acute orbital findings. 2. Absent left lens with small left anterior chamber, probably postsurgical or chronic in this patient with reported chronic left blindness. Recommend correlation with ophthalmologic history.  Assessment: 65 y.o. female with PMHx significant for aphakia of her left  eye, congenital rubella with deafness and blindness of the left eye, GERD, hypothyroidism, obesity, bicuspid aortic valve status post bioprosthetic valve replacement, chronic diastolic CHF, hyperlipidemia, hypertension who presents with  blurred vision in the right eye. Saw ophthalmologist and diagnosed with R optic neuritis with optic disc edema. Her neurologic examination is notable for R superior monocular hemi-visual field deficit.   MRI brain imaging is without enhancement of the R optic nerve to indicate optic neuritis and NMO labs are negative, though MRI imaging is not 100% sensitive. She has significant confluent white matter disease in the bilateral cerebral hemispheres without discrete lesions suggestive of demyelination and she does not give a history of acquired neurologic deficits transient or otherwise to suggest a longstanding history of MS. Suspect findings are 2/2 combination of chronic small vessel ischemic disease and potentially damage 2/2 congenital rubella (also responsible for her deafness and L eye blindness) although cumulative damage from previous demyelination cannot be ruled out. I suspect her papilledema would have been bilateral but her L optic nerve was previously too damaged to swell. With seeming isolated right papilledema and right blurry vision, CT venogram and CT angio imaging were obtained without acute abnormality. LP was unable to be performed even with IR 2/2 her severe scoliosis. She will require cervical puncture with NIR or NSU for assessment of OP. I suspect the papilledema is reflective of increased ICP and less likely optic neuritis. However, given that she is deaf and already blind in her L eye, losing vision permanently in her R eye would be catastrophic for ability to communicate and overall quality of life. As such, we did give her a 5 day course of IV solumedrol 1000mg  q 24 hrs with no/minimal improvement. NAION is also on the differential, particularly given her multiple vascular risk factors. Ophthalmology consulted recommends transfer to a tertiary care center with a neuro-ophthalmology department like [facility] given that patient is already deaf and blind in her other eye. Losing vision in her R  eye would have catastrophic effects on her ability to communicate and overall quality of life. Neurology in agreement. Primary team is requesting transfer today.  Work up thus far  RPR 11/22: Non Reactive NMO-IgG < 1.5 Bartonella negative HSV 1&2 DNA negative Toxoplasma  Pending labs: Serum lyme, ANA with IFA,   Recommendations: - Follow up on pending labs  - Primary team to request transfer to a tertiary care center with a neuro-ophthalmology department such as [facility] or [facility]  -If unable to transfer may consider cervical puncture with NIR or NSU to assess OP - Neurology will continue to follow  12/22, NP   Neurology Attending Attestation   I examined the patient and discussed plan with Ms. Ashley Mart NP. Above note has been edited by me to reflect my findings and recommendations. [facility] has accepted patient for transfer to their general neurology service. I spoke to [facility] neuro attending by phone about the patient prior to her transfer. Neurology will follow until she is transferred; will defer consideration of cervical puncture to admitting team at [facility].   [facility], MD [facility] [phone]   If 7pm- 7am, please page neurology on call as listed in AMION.

## 2021-02-12 ENCOUNTER — Encounter (HOSPITAL_COMMUNITY): Payer: Self-pay | Admitting: Family Medicine

## 2021-02-12 DIAGNOSIS — I110 Hypertensive heart disease with heart failure: Secondary | ICD-10-CM | POA: Diagnosis not present

## 2021-02-12 DIAGNOSIS — I5032 Chronic diastolic (congestive) heart failure: Secondary | ICD-10-CM | POA: Diagnosis not present

## 2021-02-12 DIAGNOSIS — M329 Systemic lupus erythematosus, unspecified: Secondary | ICD-10-CM | POA: Diagnosis not present

## 2021-02-12 DIAGNOSIS — H538 Other visual disturbances: Secondary | ICD-10-CM | POA: Diagnosis not present

## 2021-02-12 DIAGNOSIS — H469 Unspecified optic neuritis: Secondary | ICD-10-CM | POA: Diagnosis not present

## 2021-02-12 DIAGNOSIS — H5461 Unqualified visual loss, right eye, normal vision left eye: Secondary | ICD-10-CM | POA: Diagnosis not present

## 2021-02-12 DIAGNOSIS — H903 Sensorineural hearing loss, bilateral: Secondary | ICD-10-CM | POA: Diagnosis not present

## 2021-02-12 DIAGNOSIS — H47011 Ischemic optic neuropathy, right eye: Secondary | ICD-10-CM | POA: Diagnosis not present

## 2021-02-12 DIAGNOSIS — E039 Hypothyroidism, unspecified: Secondary | ICD-10-CM | POA: Diagnosis not present

## 2021-02-12 DIAGNOSIS — Z952 Presence of prosthetic heart valve: Secondary | ICD-10-CM | POA: Diagnosis not present

## 2021-02-13 LAB — ANTINUCLEAR ANTIBODIES, IFA: ANA Ab, IFA: NEGATIVE

## 2021-02-14 ENCOUNTER — Telehealth: Payer: Self-pay | Admitting: Cardiology

## 2021-02-14 DIAGNOSIS — H469 Unspecified optic neuritis: Secondary | ICD-10-CM | POA: Diagnosis not present

## 2021-02-14 DIAGNOSIS — H47011 Ischemic optic neuropathy, right eye: Secondary | ICD-10-CM | POA: Diagnosis not present

## 2021-02-14 DIAGNOSIS — H5461 Unqualified visual loss, right eye, normal vision left eye: Secondary | ICD-10-CM | POA: Diagnosis not present

## 2021-02-14 NOTE — Telephone Encounter (Signed)
Spoke with [facility] and advised per [facility] on 01/24/2021  Praluent PA approved through 03/17/21. Rx sent to pharmacy, pt aware to change to Praluent once she uses up her home supply of Repatha. Has both Medicare Part D and Medicaid so copay should be affordable. She's aware to call with any concerns.  Annice Pih states she will cancel the Repatha as pt has already picked up Praluent.

## 2021-02-14 NOTE — Telephone Encounter (Signed)
Pt c/o medication issue:  1. Name of Medication: Evolocumab (REPATHA SURECLICK) 140 MG/ML SOAJ  Alirocumab (PRALUENT) 75 MG/ML SOAJ  2. How are you currently taking this medication (dosage and times per day)? Inject 140 mg into the skin every 14 (fourteen) days.  Inject 1 pen into the skin every 14 (fourteen) days.  3. Are you having a reaction (difficulty breathing--STAT)? no  4. What is your medication issue? Calling to see if we want the patient to be taking both medication at the same time. And if so need authorization for the repatha

## 2021-02-15 NOTE — Addendum Note (Signed)
Addended by: Mitchael Luckey E on: 02/15/2021 12:16 PM   Modules accepted: Orders

## 2021-02-18 NOTE — Anesthesia Postprocedure Evaluation (Signed)
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  Procedure(s) Performed: MRI WITH ANESTHESIA- BRAIN WITH /WITHOUT  CONSTRAST ORBITS WITH / WITHOUT CONSTRAST     Patient location during evaluation: PACU Anesthesia Type: General Level of consciousness: awake and alert Pain management: pain level controlled Vital Signs Assessment: post-procedure vital signs reviewed and stable Respiratory status: spontaneous breathing, nonlabored ventilation, respiratory function stable and patient connected to nasal cannula oxygen Cardiovascular status: blood pressure returned to baseline and stable Postop Assessment: no apparent nausea or vomiting Anesthetic complications: no   No notable events documented.  Last Vitals:  Vitals:   02/11/21 1941 02/11/21 2201  BP: (!) 151/66 (!) 160/78  Pulse: 75 96  Resp:  20  Temp: 36.8 C 36.8 C  SpO2: 94% 96%    Last Pain:  Vitals:   02/11/21 2000  TempSrc:   PainSc: 0-No pain                 Therron Sells L Wanda Cellucci

## 2021-02-19 ENCOUNTER — Other Ambulatory Visit: Payer: Self-pay | Admitting: Pharmacist

## 2021-02-23 ENCOUNTER — Other Ambulatory Visit: Payer: Self-pay | Admitting: Student

## 2021-02-23 DIAGNOSIS — I10 Essential (primary) hypertension: Secondary | ICD-10-CM

## 2021-02-26 ENCOUNTER — Ambulatory Visit (INDEPENDENT_AMBULATORY_CARE_PROVIDER_SITE_OTHER): Payer: Medicare HMO | Admitting: Family Medicine

## 2021-02-26 DIAGNOSIS — E039 Hypothyroidism, unspecified: Secondary | ICD-10-CM

## 2021-02-26 NOTE — Progress Notes (Signed)
err

## 2021-03-01 ENCOUNTER — Other Ambulatory Visit: Payer: Self-pay

## 2021-03-01 ENCOUNTER — Ambulatory Visit (INDEPENDENT_AMBULATORY_CARE_PROVIDER_SITE_OTHER): Payer: Medicare HMO | Admitting: Family Medicine

## 2021-03-01 DIAGNOSIS — Z0289 Encounter for other administrative examinations: Secondary | ICD-10-CM

## 2021-03-01 DIAGNOSIS — H5440 Blindness, one eye, unspecified eye: Secondary | ICD-10-CM | POA: Diagnosis not present

## 2021-03-01 DIAGNOSIS — H905 Unspecified sensorineural hearing loss: Secondary | ICD-10-CM

## 2021-03-01 NOTE — Progress Notes (Signed)
° °  SUBJECTIVE:   CHIEF COMPLAINT / HPI:   Ashley Torres is a 65 y.o. female here for to discuss paperwork for deaf and blind services. She has no other concerns today.  She was hospitalized for concerns of right optic neuritis. She was transferred to [facility] after completing 5d course of high dose steroids. She reports [facility] physicians told her it was not related to lack of blood flow, multiple sclerosis, diabetes, hypertension or tumor. She has an upcoming appointment with an eye specialists. She is to follow up with [facility] specialists in 2-6 months.      PERTINENT  PMH / PSH: reviewed and updated as appropriate   OBJECTIVE:   LMP  (LMP Unknown)   Declined to have vital signs today.  GEN: well appearing female in no acute distress HENT: notable ear deformity bilaterally   CVS: well perfused  RESP: speaking in full sentences without pause, no respiratory distress    ASSESSMENT/PLAN:    Congenital Deafness   Left Eye Blindness   Threatened Vision Loss of Right Eye  Reviewed hospital discharge summary and [facility] hospital notes. Medicaid forms completed. Provided letter stating patient has bilateral congenital deafness and left eye blindness.   Katha Cabal, DO PGY-3, [facility] 03/01/2021

## 2021-03-02 ENCOUNTER — Encounter: Payer: Self-pay | Admitting: Family Medicine

## 2021-03-02 DIAGNOSIS — H47332 Pseudopapilledema of optic disc, left eye: Secondary | ICD-10-CM | POA: Diagnosis not present

## 2021-03-02 DIAGNOSIS — H35361 Drusen (degenerative) of macula, right eye: Secondary | ICD-10-CM | POA: Diagnosis not present

## 2021-03-02 DIAGNOSIS — H43821 Vitreomacular adhesion, right eye: Secondary | ICD-10-CM | POA: Diagnosis not present

## 2021-03-02 DIAGNOSIS — H35033 Hypertensive retinopathy, bilateral: Secondary | ICD-10-CM | POA: Diagnosis not present

## 2021-03-08 DIAGNOSIS — H47321 Drusen of optic disc, right eye: Secondary | ICD-10-CM | POA: Diagnosis not present

## 2021-03-08 DIAGNOSIS — Q120 Congenital cataract: Secondary | ICD-10-CM | POA: Diagnosis not present

## 2021-03-08 DIAGNOSIS — H47011 Ischemic optic neuropathy, right eye: Secondary | ICD-10-CM | POA: Diagnosis not present

## 2021-03-08 DIAGNOSIS — H53012 Deprivation amblyopia, left eye: Secondary | ICD-10-CM | POA: Diagnosis not present

## 2021-03-22 ENCOUNTER — Other Ambulatory Visit: Payer: Self-pay | Admitting: Physician Assistant

## 2021-04-09 ENCOUNTER — Other Ambulatory Visit: Payer: Self-pay | Admitting: Neurology

## 2021-04-09 ENCOUNTER — Other Ambulatory Visit: Payer: Self-pay | Admitting: Family

## 2021-04-09 DIAGNOSIS — Z1231 Encounter for screening mammogram for malignant neoplasm of breast: Secondary | ICD-10-CM

## 2021-04-12 ENCOUNTER — Telehealth: Payer: Self-pay | Admitting: Cardiology

## 2021-04-12 NOTE — Telephone Encounter (Signed)
Spoke with Ashley Torres in [facility] Scheduling.  This pt is already in the work que with [facility], to have a sign language interpreter present at any visit within the [facility]. [facility] will reach out to the sign language interpreter contracted with them to have this service provided to the pt with any appointments in the system.   Will send this message back to the scheduling team to make them aware of this process and to call [facility] Interpreter Service for further questions, concerns, or details about this at [phone].

## 2021-04-12 NOTE — Telephone Encounter (Signed)
Pt is needing an in person sign language interpreter... pt is losing her sight so video relay will not work either... please advise

## 2021-04-27 DIAGNOSIS — H53012 Deprivation amblyopia, left eye: Secondary | ICD-10-CM | POA: Diagnosis not present

## 2021-04-27 DIAGNOSIS — Q120 Congenital cataract: Secondary | ICD-10-CM | POA: Diagnosis not present

## 2021-04-27 DIAGNOSIS — H47011 Ischemic optic neuropathy, right eye: Secondary | ICD-10-CM | POA: Diagnosis not present

## 2021-04-27 DIAGNOSIS — H541214 Low vision right eye category 1, blindness left eye category 4: Secondary | ICD-10-CM | POA: Diagnosis not present

## 2021-05-08 DIAGNOSIS — H47321 Drusen of optic disc, right eye: Secondary | ICD-10-CM | POA: Diagnosis not present

## 2021-05-08 DIAGNOSIS — H47011 Ischemic optic neuropathy, right eye: Secondary | ICD-10-CM | POA: Diagnosis not present

## 2021-05-24 ENCOUNTER — Ambulatory Visit
Admission: RE | Admit: 2021-05-24 | Discharge: 2021-05-24 | Disposition: A | Discharge status: Home / Self Care | Payer: Medicare HMO | Source: Ambulatory Visit | Attending: Neurology | Admitting: Neurology

## 2021-05-24 DIAGNOSIS — Z1231 Encounter for screening mammogram for malignant neoplasm of breast: Secondary | ICD-10-CM

## 2021-05-24 IMAGING — MG MM DIGITAL SCREENING BILAT W/ TOMO AND CAD
8 series · 8 of 24 positions shown · non-contrast
Comparison: Previous exam(s).

CLINICAL DATA: Screening.

EXAM:
DIGITAL SCREENING BILATERAL MAMMOGRAM WITH TOMOSYNTHESIS AND CAD
TECHNIQUE: Bilateral screening digital craniocaudal and mediolateral oblique
mammograms were obtained. Bilateral screening digital breast
tomosynthesis was performed. The images were evaluated with
computer-aided detection.

[L CC synth-2D]
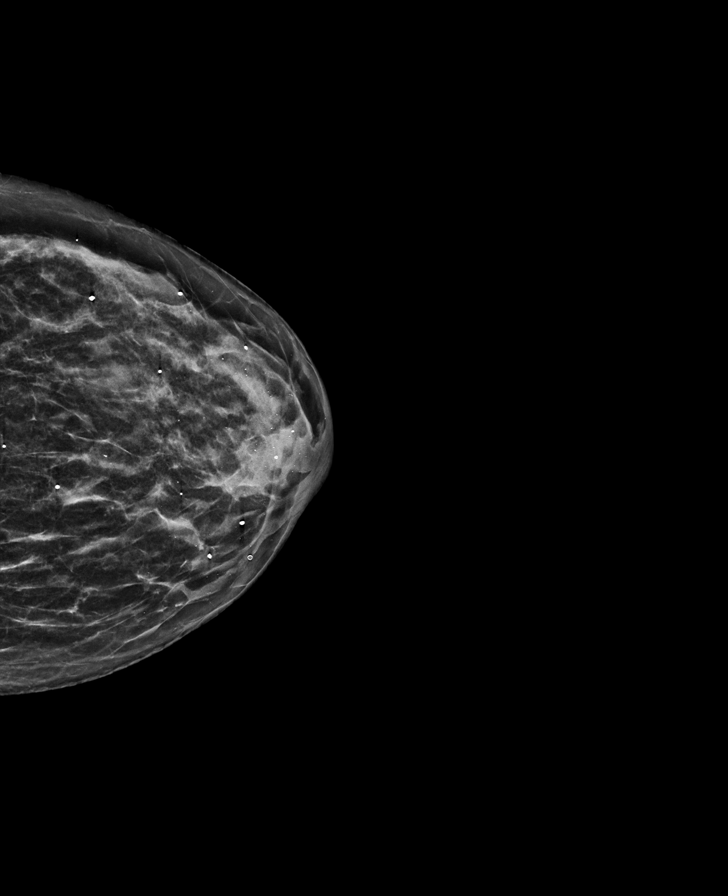

[L MLO synth-2D]
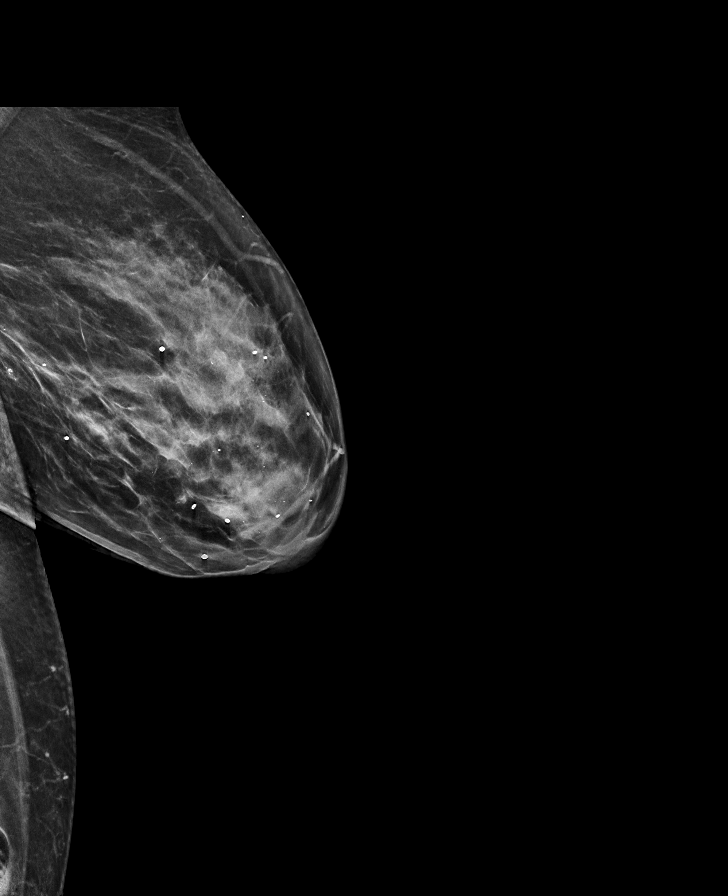

[R MLO synth-2D]
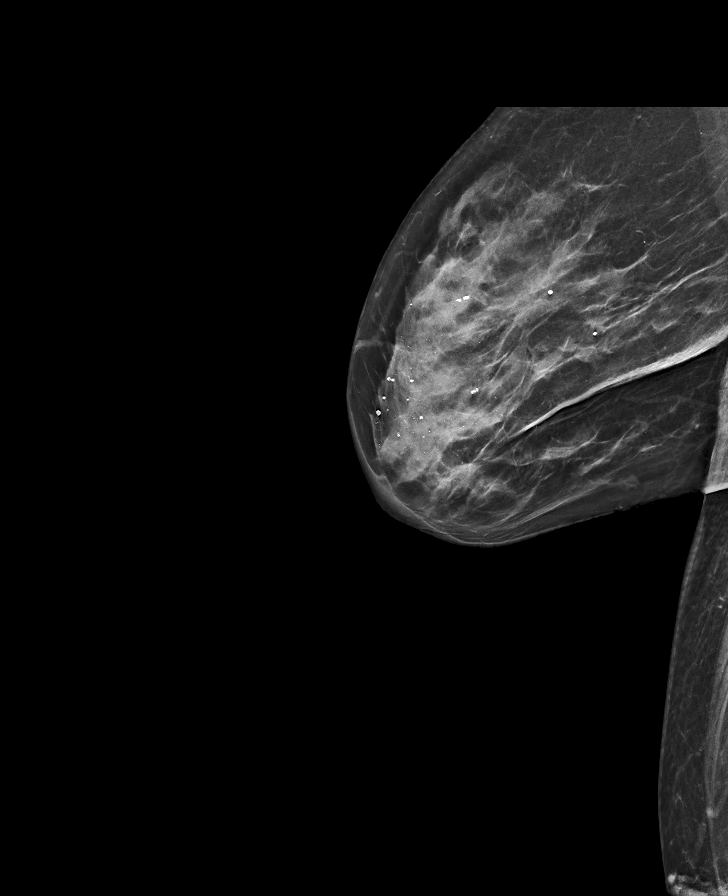

[R CC synth-2D]
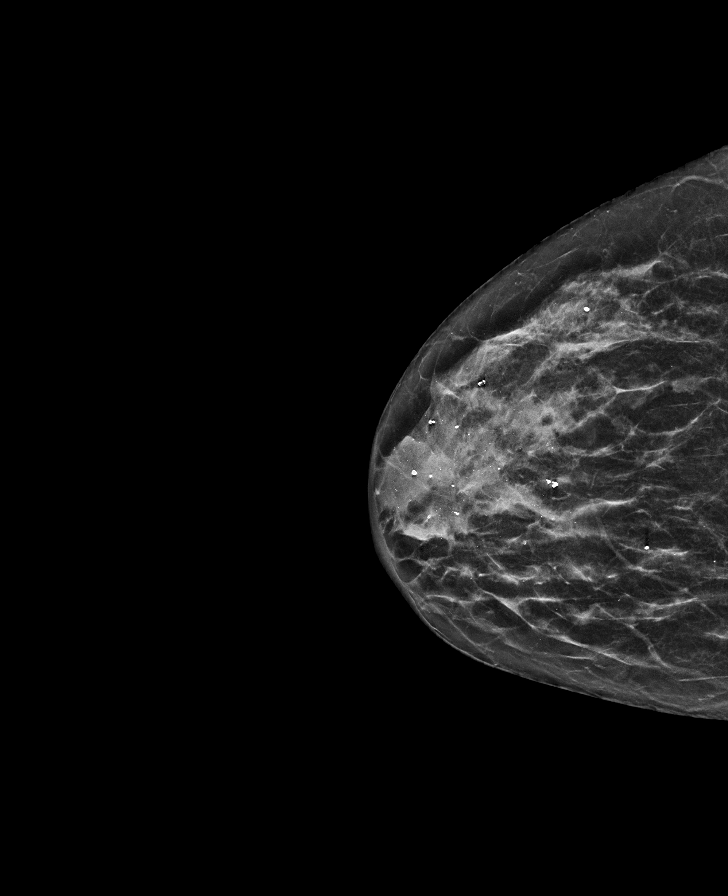

[L CC tomo · tomo slice 23/46.0]
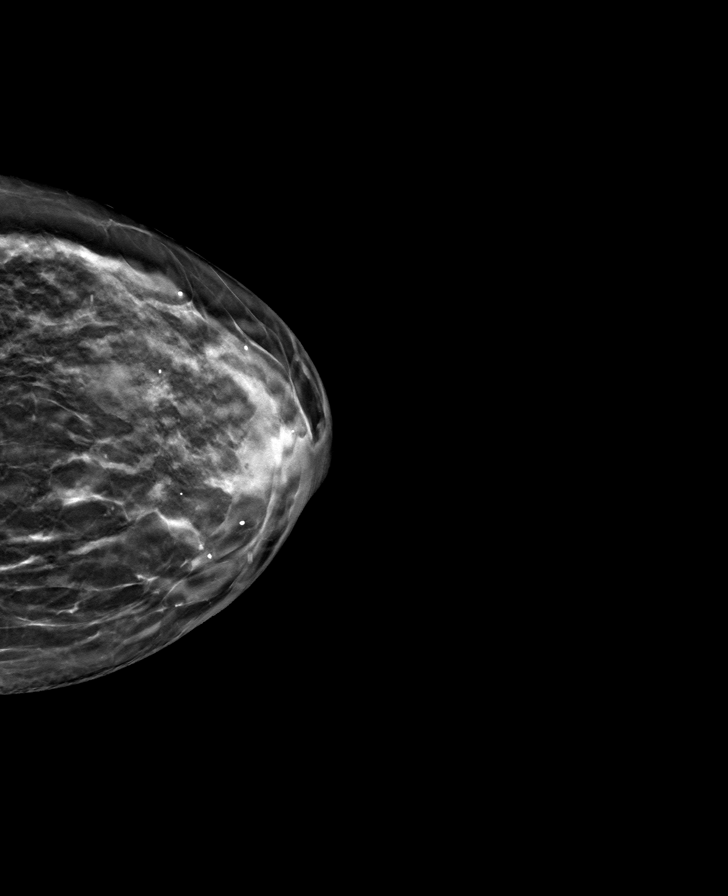

[R CC tomo · tomo slice 26/51.0]
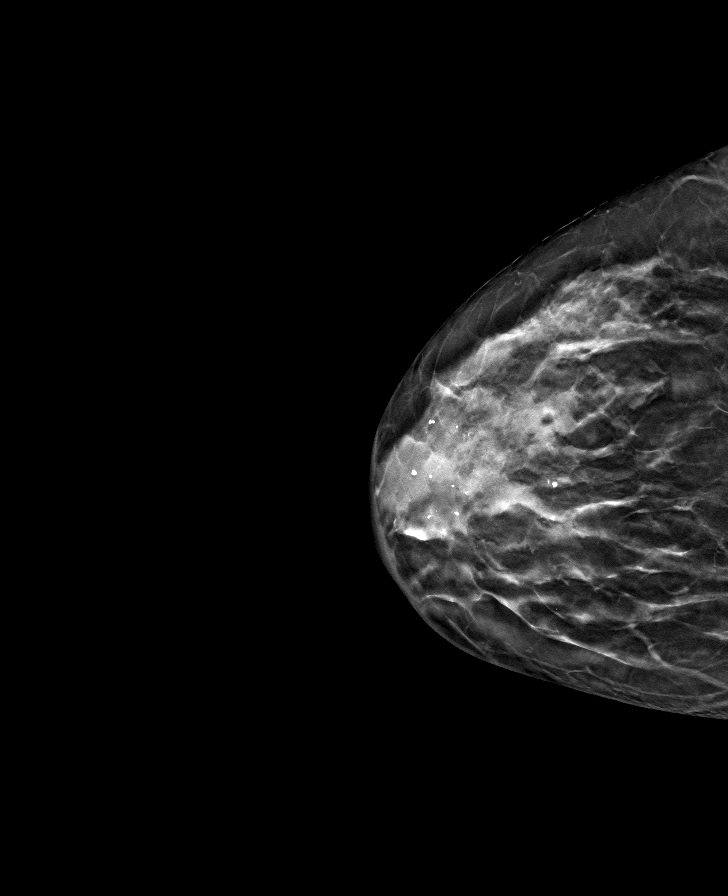

[L MLO tomo · tomo slice 34/67.0]
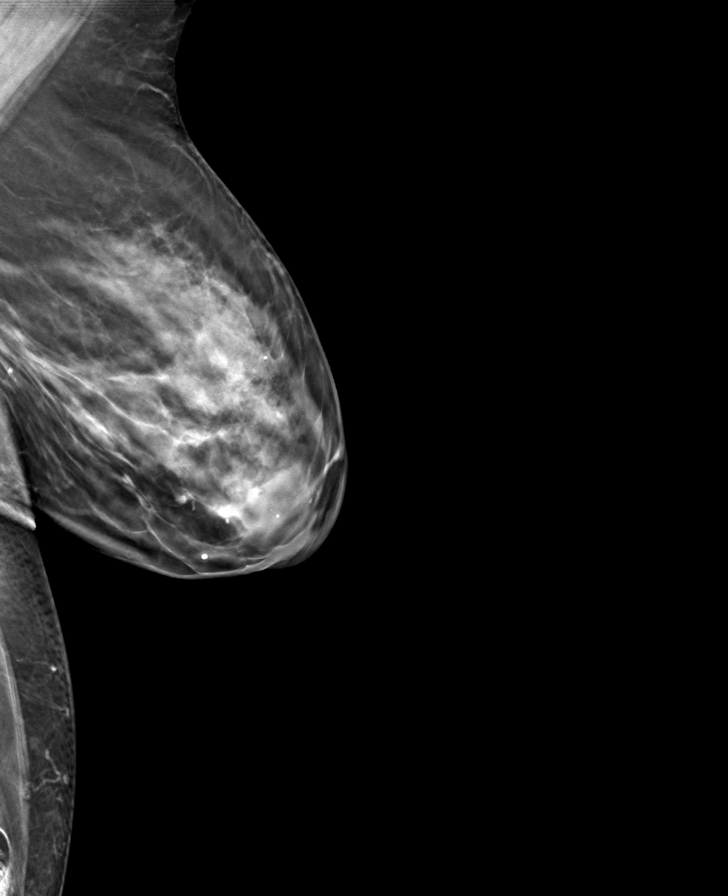

[R MLO tomo · tomo slice 33/65.0]
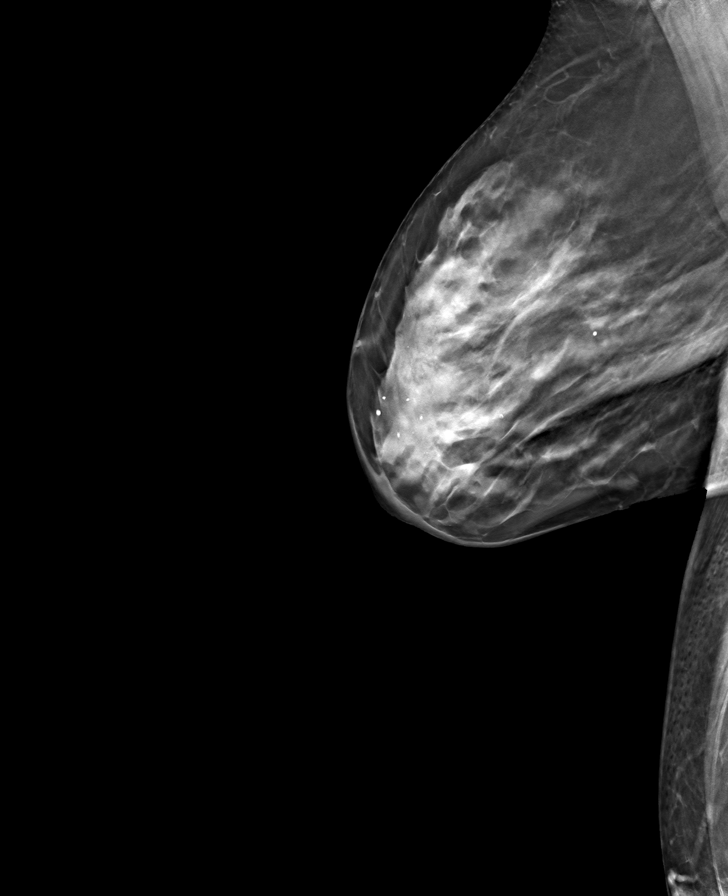

[8 of 24 positions shown; findings below may reference images not displayed]

ACR Breast Density Category c: The breast tissue is heterogeneously
dense, which may obscure small masses.
FINDINGS: There are no findings suspicious for malignancy.
IMPRESSION: No mammographic evidence of malignancy. A result letter of this
screening mammogram will be mailed directly to the patient.

RECOMMENDATION:
Screening mammogram in one year. (Code:[V2])

BI-RADS CATEGORY  1: Negative.

## 2021-05-31 ENCOUNTER — Other Ambulatory Visit: Payer: Self-pay | Admitting: Student

## 2021-06-22 ENCOUNTER — Ambulatory Visit (HOSPITAL_COMMUNITY): Payer: Medicare HMO | Attending: Cardiology

## 2021-06-22 ENCOUNTER — Other Ambulatory Visit: Payer: Medicare HMO

## 2021-06-22 DIAGNOSIS — Z952 Presence of prosthetic heart valve: Secondary | ICD-10-CM | POA: Diagnosis not present

## 2021-06-22 LAB — ECHOCARDIOGRAM COMPLETE
AR max vel: 0.74 cm2
AV Area VTI: 0.79 cm2
AV Area mean vel: 0.77 cm2
AV Mean grad: 24 mmHg
AV Peak grad: 42.4 mmHg
Ao pk vel: 3.26 m/s
Area-P 1/2: 2.6 cm2
S' Lateral: 2.3 cm

## 2021-07-23 ENCOUNTER — Ambulatory Visit: Payer: Medicare HMO | Admitting: Cardiology

## 2021-07-24 ENCOUNTER — Other Ambulatory Visit: Payer: Medicare HMO | Admitting: *Deleted

## 2021-07-24 ENCOUNTER — Ambulatory Visit: Payer: Medicare HMO | Admitting: Cardiology

## 2021-07-24 DIAGNOSIS — E785 Hyperlipidemia, unspecified: Secondary | ICD-10-CM

## 2021-07-24 LAB — LIPID PANEL
Chol/HDL Ratio: 2.8 ratio (ref 0.0–4.4)
Cholesterol, Total: 138 mg/dL (ref 100–199)
HDL: 49 mg/dL (ref >39)
LDL Chol Calc (NIH): 61 mg/dL (ref 0–99)
Triglycerides: 170 mg/dL — ABNORMAL HIGH (ref 0–149)
VLDL Cholesterol Cal: 28 mg/dL (ref 5–40)

## 2021-07-27 ENCOUNTER — Ambulatory Visit (INDEPENDENT_AMBULATORY_CARE_PROVIDER_SITE_OTHER): Payer: Medicare HMO | Admitting: Student

## 2021-07-27 ENCOUNTER — Ambulatory Visit (INDEPENDENT_AMBULATORY_CARE_PROVIDER_SITE_OTHER): Payer: Medicare HMO

## 2021-07-27 ENCOUNTER — Encounter: Payer: Self-pay | Admitting: Student

## 2021-07-27 VITALS — BP 114/68 | HR 65 | Ht <= 58 in | Wt 163.0 lb

## 2021-07-27 DIAGNOSIS — I10 Essential (primary) hypertension: Secondary | ICD-10-CM

## 2021-07-27 DIAGNOSIS — E039 Hypothyroidism, unspecified: Secondary | ICD-10-CM

## 2021-07-27 DIAGNOSIS — Z23 Encounter for immunization: Secondary | ICD-10-CM

## 2021-07-27 MED ORDER — LEVOTHYROXINE SODIUM 100 MCG PO TABS: 100.0000 ug | ORAL_TABLET | Freq: Every day | ORAL | 3 refills | Status: DC

## 2021-07-27 MED ORDER — LISINOPRIL 10 MG PO TABS: 10.0000 mg | ORAL_TABLET | Freq: Every day | ORAL | 0 refills | Status: DC

## 2021-07-27 MED ORDER — HYDROCHLOROTHIAZIDE 25 MG PO TABS: ORAL_TABLET | ORAL | 1 refills | Status: DC

## 2021-07-27 MED ORDER — MECLIZINE HCL 25 MG PO TABS: 25.0000 mg | ORAL_TABLET | Freq: Three times a day (TID) | ORAL | 3 refills | Status: DC | PRN

## 2021-07-27 MED ORDER — ALBUTEROL SULFATE HFA 108 (90 BASE) MCG/ACT IN AERS: 2.0000 | INHALATION_SPRAY | RESPIRATORY_TRACT | 3 refills | Status: DC | PRN

## 2021-07-27 NOTE — Progress Notes (Signed)
? ? ?  SUBJECTIVE:  ? ?CHIEF COMPLAINT / HPI:  ? ?Ashley Torres is a 66 year-old female here for a follow-up/medication refill. Her concerns today include skin tags under right armpit that bother her with shaving. No other complaints. Would like to have her TSH checked- feeling a little cold sometimes. ? ?Would like to receive COVID booster today. ? ?Medications: Reviewed and updated. ? ?In-person ASL interpreter used for entirety of exam today. ? ?History tabs reviewed and updated. ? ?PERTINENT  PMH / PSH: Reviewed ? ?OBJECTIVE:  ? ?BP 114/68   Pulse 65   Ht 4' 9.5" (1.461 m)   Wt 163 lb (73.9 kg)   LMP  (LMP Unknown)   SpO2 98%   BMI 34.66 kg/m?   ? ?General: Well-appearing, pleasant, walks unassisted ?HEENT: Pupils PERRLA ?CV: RRR, no murmurs ?Resp: Clear in all fields. Normal WOB on room air ?Abdomen: Soft, non-distended, umbilical hernia reducible ?Skin: Small skin tags in right armpit ?Ext: Warm, well-perfused, LE edema BL ? ? ?ASSESSMENT/PLAN:  ? ?Hypothyroidism (acquired) ?2/2 iatrogenic thyroidectomy for benign nodule. No worsening hypothyroid symptoms other than feeling a bit cold. ?- Continue levothyroxine 100 mcg daily, will adjust as appropriate pending TSH level collected today ? ?HTN (hypertension) ?At goal. Continue lisinopril 10mg  daily and HCTZ 25mg  daily. BMP next visit in 6 months. ?  ? COVID booster today. ? ?Recommended to make f/u dermatology clinic appt for skin tag removal. ? ?Medications refilled and sent to pharmacy. ? ? , DO ?[facility]  ? ? ? ?

## 2021-07-27 NOTE — Assessment & Plan Note (Signed)
At goal. Continue lisinopril 10mg  daily and HCTZ 25mg  daily. BMP next visit in 6 months. ?

## 2021-07-27 NOTE — Assessment & Plan Note (Signed)
2/2 iatrogenic thyroidectomy for benign nodule. No worsening hypothyroid symptoms other than feeling a bit cold. ?- Continue levothyroxine 100 mcg daily, will adjust as appropriate pending TSH level collected today ?

## 2021-07-27 NOTE — Patient Instructions (Addendum)
It was great seeing you today, Ashley Torres! I refilled your medications for you. You also received your COVID booster vaccine.  ? ?We are checking your thyroid levels, I will call you if your results are abnormal or need any medication changes. ? ?Please schedule an appointment for a pap smear in the next few months. ?For your skin tags, schedule an appointment with our dermatology clinic and they can remove them. ?  ?If you have any questions or concerns, please feel free to call the clinic.  ?  ?Be well,  ?Dr. Darral Dash ?[facility] ?[phone]  ?

## 2021-07-28 LAB — TSH: TSH: 0.925 u[IU]/mL (ref 0.450–4.500)

## 2021-08-01 ENCOUNTER — Encounter: Payer: Self-pay | Admitting: Student

## 2021-08-16 ENCOUNTER — Ambulatory Visit (INDEPENDENT_AMBULATORY_CARE_PROVIDER_SITE_OTHER): Payer: Medicare HMO | Admitting: Student

## 2021-08-16 ENCOUNTER — Encounter: Payer: Self-pay | Admitting: Student

## 2021-08-16 ENCOUNTER — Telehealth: Payer: Self-pay | Admitting: *Deleted

## 2021-08-16 VITALS — BP 132/74 | HR 69 | Ht <= 58 in | Wt 165.0 lb

## 2021-08-16 DIAGNOSIS — H5789 Other specified disorders of eye and adnexa: Secondary | ICD-10-CM | POA: Insufficient documentation

## 2021-08-16 DIAGNOSIS — H905 Unspecified sensorineural hearing loss: Secondary | ICD-10-CM | POA: Diagnosis not present

## 2021-08-16 NOTE — Progress Notes (Signed)
    SUBJECTIVE:   CHIEF COMPLAINT / HPI:   Hypertension Did not take BP medicine today  Right eye redness Noticed a red spot on her eye one week ago No pain Thought she may have scratched it No vision changes Has low vision at abseline 3 weeks ago had an appointment but supposed to go yesterday- everythng is full and cant get in  *In-person ASL interpreter used for entirety of visit  PERTINENT  PMH / PSH: Reviewed  OBJECTIVE:   BP 132/74   Pulse 69   Ht 4' 9.5" (1.461 m)   Wt 165 lb (74.8 kg)   LMP  (LMP Unknown)   SpO2 99%   BMI 35.09 kg/m   General: Well-appearing, pleasant, in no distress HEENT: Conjunctival injection bilaterally- mild. EMOI. No pain with palpation of surrounding orbital area. No drainage noted. No ptosis or propotosis. CV: RRR Resp: Normal WOB on room air   ASSESSMENT/PLAN:   Redness, eye No concern for acute orbital emergency- no signs of orbital cellulitis. No red flag symptoms- including vision changes, proptosis, associated headache or scalp pain. Eye exam generally unremarkable other than mild conjunctival injection in right eye. ? Possibly trauma from scratching eye vs. Allergies or viral conjunctivitis. Encouraged pt to follow-up with [facility] where she is established. Care coordination CCM referral placed to aid in transportation needs. Return precautions discussed.     Darral Dash, DO [facility]

## 2021-08-16 NOTE — Assessment & Plan Note (Addendum)
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 No concern for acute orbital emergency- no signs of orbital cellulitis. No red flag symptoms- including vision changes, proptosis, associated headache or scalp pain. Eye exam generally unremarkable other than mild conjunctival injection in right eye. ? Possibly trauma from scratching eye vs. Allergies or viral conjunctivitis. Encouraged pt to follow-up with [facility] where she is established. Care coordination CCM referral placed to aid in transportation needs. Return precautions discussed.

## 2021-08-16 NOTE — Telephone Encounter (Signed)
LM with interpreter to tell pt to call office.  Calling to let her know that she is not due for a PAP until 01/08/2023.  If she doesn't have anything else to see doctor for she does not need to come in but if there is something else that she needs to be seen for we are happy to see her.Joyice Magda Zimmerman Rumple, CMA

## 2021-08-16 NOTE — Patient Instructions (Signed)
It was great seeing you today.  Your eye may be a bit red from allergies Make sure you are using your eye drops appropriately  There is no sign of infection or other acute eye emergency  If you develop vision changes, headache, swelling of the eye- please seek urgent care  I placed a referral to our care coordination team to help with transportation needs   If you have any questions or concerns, please feel free to call the clinic.    Be well,  Dr. Darral Dash [facility] [phone]

## 2021-08-16 NOTE — Telephone Encounter (Signed)
Patient called back and came in for something different.Ashley Torres, CMA

## 2021-08-17 ENCOUNTER — Telehealth: Payer: Self-pay | Admitting: *Deleted

## 2021-08-17 NOTE — Chronic Care Management (AMB) (Signed)
  Care Management   Note  08/17/2021 Name: Ashley Torres MRN: [id number] DOB: Jul 18, 1955  [name] is a 66 y.o. year old female who is a primary care patient of Darral Dash, DO. I reached out to [employer] by phone today offer care coordination services.   [name] was given information about care management services today including:  Care management services include personalized support from designated clinical staff supervised by her physician, including individualized plan of care and coordination with other care providers 24/7 contact phone numbers for assistance for urgent and routine care needs. The patient may stop care management services at any time by phone call to the office staff.  Patient agreed to services and verbal consent obtained.   Follow up plan: Telephone appointment with care management team member scheduled for:08/20/21  [facility] Guide, Embedded Care Coordination [facility]  Care Management  Direct Dial: [phone]

## 2021-08-20 ENCOUNTER — Telehealth: Payer: Medicare HMO

## 2021-08-20 ENCOUNTER — Telehealth: Payer: Self-pay

## 2021-08-20 NOTE — Telephone Encounter (Signed)
  Care Management   Follow Up Note   08/20/2021 Name: Ashley Torres MRN: [id number] DOB: 22-Nov-1955   Referred by: Orvis Brill, DO Reason for referral : Care Coordination (Unsuccessful call)   An unsuccessful telephone outreach was attempted today. The patient was referred to the case management team for assistance with care management and care coordination.   Follow Up Plan: The care management team will reach out to the patient again over the next 30 days.   Daneen Schick, BSW, CDP Social Worker, Certified Dementia Practitioner [place] [facility] [phone]

## 2021-08-22 ENCOUNTER — Telehealth: Payer: Self-pay | Admitting: Pharmacist

## 2021-08-22 NOTE — Telephone Encounter (Signed)
Spoke with pt, nothing is needed from our end.

## 2021-08-22 NOTE — Telephone Encounter (Signed)
Patient called thru interpreter and wanted to let Jinny Blossom know that the insurance stopped covering the 90 day and went down to 60 days. Patient wants to know how that will work for  100 day injections

## 2021-08-30 ENCOUNTER — Telehealth: Payer: Self-pay | Admitting: *Deleted

## 2021-08-30 NOTE — Chronic Care Management (AMB) (Signed)
  Care Coordination Note  08/30/2021 Name: Seleni Meller MRN: [id number] DOB: 1955-07-25  Ashley Torres is a 66 y.o. year old female who is a primary care patient of Darral Dash, DO and is actively engaged with the care management team. I reached out to [employer] by phone today to assist with re-scheduling an initial visit with the BSW.  Follow up plan: Patient declines further follow up and engagement by the care management team patient stated through interpreter "she does not need a Child psychotherapist and she is going out of town" patient then hung up the phone. Appropriate care team members and provider have been notified via electronic communication.   Gwenevere Ghazi  Care Guide, Embedded Care Coordination [facility] Management  Direct Dial: [phone]

## 2021-10-03 DIAGNOSIS — H1045 Other chronic allergic conjunctivitis: Secondary | ICD-10-CM | POA: Diagnosis not present

## 2021-10-03 DIAGNOSIS — H47321 Drusen of optic disc, right eye: Secondary | ICD-10-CM | POA: Diagnosis not present

## 2021-10-03 DIAGNOSIS — H47011 Ischemic optic neuropathy, right eye: Secondary | ICD-10-CM | POA: Diagnosis not present

## 2021-11-13 ENCOUNTER — Other Ambulatory Visit: Payer: Self-pay | Admitting: Student

## 2021-11-13 DIAGNOSIS — I10 Essential (primary) hypertension: Secondary | ICD-10-CM

## 2021-12-26 NOTE — Progress Notes (Deleted)
Cardiology Office Note:    Date:  12/26/2021   ID:  Anysha Laban, DOB 12-10-1955, MRN [id number]  PCP:  Orvis Brill, [place] Providers Cardiologist:  Ena Dawley, MD   Referring MD: Orvis Brill, DO    History of Present Illness:    Ashley Torres is a 66 y.o. female with a hx of congenital deafness, bicuspid aortic valve status post bioprosthetic aortic valve replacement in 03/2015, coarctation of the aorta status post repair as a child, hypertension, hyperlipidemia, hypothyroidism, chronic lower extremity edema, chronic sinusitis, and GERD   Past Medical History:  Diagnosis Date   Allergic rhinitis    Anxiety    Aortic stenosis    Aphakia of left eye    Aphakia of left eye 06/29/2014   Bicuspid aortic valve    Blindness of left eye    Cervical prolapse 09/14/2014   Stage 3 cervical prolapse with urge incontinence per Gynecology. 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   Overview:  - congential deafness - congenital blindness in Left Eye - congenital aortic stenosis   Deafness congenital    Drusen of right optic disc    Environmental and seasonal allergies    Gastric stress ulcer    GERD (gastroesophageal reflux disease) 1990   H/O aortic coarctation repair    H/O aortic valve replacement 01/13/2016   Congenital biscuspid aortic valve s/p replacement 03/2015 and coarctation of aorta s/p repair in childhood   H/O coarctation of aorta 01/13/2016   HLD (hyperlipidemia) 07/17/2013   Overview:  - Lipid at goal (ASCVD 10-yr risk 3.1%) on Simvastatin 20 mg , d/c statin in 2016 for myalgias. Started red rice yeast. Statin restarted in hospital and now patient off all meds for this  Last Assessment & Plan:  Remains at goal    HTN (hypertension) 01/13/2016   Overview:  - Goal SBP <140 - Lisinopril 10 mg p.o. (07/20/19 decreased to 10 mg from 20 mg for  dizziness symptoms).  -    Hyperlipidemia 1997   Hypertension 1997   Hypothyroidism    Hypothyroidism (acquired) 07/17/2013   Overview:  - secondary to iatrogenic thyroidectomy for benign nodule - levothyroxine 88 mg/day   Nuclear sclerosis of right eye    Obesity    Obesity, Class II, BMI 35-39.9 07/17/2013   Overview:  - weight stable with diet and exercise - consider bariatric surgery given associated HTN   Rubella syndrome, congenital    S/P AVR (aortic valve replacement)    Scoliosis    Uterine prolapse    Ventral hernia without obstruction or gangrene 07/21/2019   Vision impairment    Vision loss, left eye     Past Surgical History:  Procedure Laterality Date   AORTIC VALVE REPLACEMENT  2017   breast biopsies     2001, 2002, & 2005    BREAST EXCISIONAL BIOPSY Left    CARDIAC CATHETERIZATION     COARCTATION OF AORTA REPAIR     repaired at age 30   [place]   blindness caused by german measles   RADIOLOGY WITH ANESTHESIA N/A 02/07/2021   Procedure: MRI WITH ANESTHESIA- BRAIN WITH /WITHOUT  CONSTRAST ORBITS WITH / WITHOUT CONSTRAST;  Surgeon: Radiologist, Medication, MD;  Location: [place];  Service: Radiology;  Laterality: N/A;   THYROID SURGERY  1997   benign mass  Current Medications: No outpatient medications have been marked as taking for the 12/28/21 encounter (Appointment) with Freada Bergeron, MD.     Allergies:   Statins, Atorvastatin, Erythromycin, Furosemide, Hydrocodone-acetaminophen, Meloxicam, Metoprolol, Montelukast, Omeprazole, Potassium chloride, and Rosuvastatin   Social History   Socioeconomic History   Marital status: Single    Spouse name: Not on file   Number of children: Not on file   Years of education: some college   Highest education level: Not on file  Occupational History    Comment: unemployed  Tobacco Use   Smoking status: Never   Smokeless tobacco: Never  Vaping Use   Vaping Use: Never used  Substance and Sexual  Activity   Alcohol use: Never   Drug use: Never   Sexual activity: Never  Other Topics Concern   Not on file  Social History Narrative   Lives alone   Right handed   Caffeine: chocolate only, not often   Social Determinants of Radio broadcast assistant Strain: Not on file  Food Insecurity: Not on file  Transportation Needs: Not on file  Physical Activity: Not on file  Stress: Not on file  Social Connections: Not on file     Family History: The patient's ***family history includes Arthritis in her brother and father; Asthma in her mother; Breast cancer in an other family member; Diabetes in her paternal grandmother; GER disease in her father; Heart murmur in her father; Hyperlipidemia in her father; Other in her mother; Rheumatic fever in her paternal grandmother; Sinusitis in her brother. There is no history of Heart disease.  ROS:   Please see the history of present illness.    *** All other systems reviewed and are negative.  EKGs/Labs/Other Studies Reviewed:    The following studies were reviewed today: ***  EKG:  EKG is *** ordered today.  The ekg ordered today demonstrates ***  Recent Labs: 02/06/2021: ALT 27 02/11/2021: BUN 31; Creatinine, Ser 0.72; Hemoglobin 13.5; Platelets 238; Potassium 3.4; Sodium 140 07/27/2021: TSH 0.925  Recent Lipid Panel    Component Value Date/Time   CHOL 138 07/24/2021 1054   TRIG 170 (H) 07/24/2021 1054   HDL 49 07/24/2021 1054   CHOLHDL 2.8 07/24/2021 1054   CHOLHDL 2.8 02/06/2021 0334   VLDL 40 02/06/2021 0334   LDLCALC 61 07/24/2021 1054   LDLDIRECT 135 (H) 12/13/2016 1440     Risk Assessment/Calculations:   {Does this patient have ATRIAL FIBRILLATION?:[phone]}  No BP recorded.  {Refresh Note OR Click here to enter BP  :1}***         Physical Exam:    VS:  LMP  (LMP Unknown)     Wt Readings from Last 3 Encounters:  08/16/21 165 lb (74.8 kg)  07/27/21 163 lb (73.9 kg)  01/05/21 163 lb (73.9 kg)     GEN:  *** Well nourished, well developed in no acute distress HEENT: Normal NECK: No JVD; No carotid bruits LYMPHATICS: No lymphadenopathy CARDIAC: ***RRR, no murmurs, rubs, gallops RESPIRATORY:  Clear to auscultation without rales, wheezing or rhonchi  ABDOMEN: Soft, non-tender, non-distended MUSCULOSKELETAL:  No edema; No deformity  SKIN: Warm and dry NEUROLOGIC:  Alert and oriented x 3 PSYCHIATRIC:  Normal affect   ASSESSMENT:    No diagnosis found. PLAN:    In order of problems listed above:  ***      {Are you ordering a CV Procedure (e.g. stress test, cath, DCCV, TEE, etc)?   Press F2        :  [id number]    Medication Adjustments/Labs and Tests Ordered: Current medicines are reviewed at length with the patient today.  Concerns regarding medicines are outlined above.  No orders of the defined types were placed in this encounter.  No orders of the defined types were placed in this encounter.   There are no Patient Instructions on file for this visit.   Signed, Freada Bergeron, MD  12/26/2021 4:47 PM    [place]

## 2021-12-27 ENCOUNTER — Encounter: Payer: Self-pay | Admitting: Cardiology

## 2021-12-27 NOTE — Progress Notes (Deleted)
Cardiology Office Note:    Date:  12/27/2021   ID:  Ashley Torres, DOB [birth year], MRN [id number]  PCP:  Orvis Brill, [facility] Cardiologist:  Ena Dawley, MD {   Referring MD: Orvis Brill, DO    History of Present Illness:    Ashley Torres is a 66 y.o. female with a hx of congenital deafness, bicuspid aortic valve status post bioprosthetic aortic valve replacement in 03/2015, coarctation of the aorta status post repair as a child, hypertension, hyperlipidemia, hypothyroidism, chronic lower extremity edema, chronic sinusitis, and GERD who presents to clinic for follow-up.   Per Dr. Francesca Oman notes, she had coarctation repair around 66 years of age. In 2016 she was found to have severe aortic stenosis. Pre-op cath 02/2015 at [facility] showed normal coronaries. She subsequently underwent aortic valve replacement with annulus enlargement (19 mm Magna model 300 Carpentier-Edwards bovine pericardial heart valve) on 04/17/15 at [facility]. TTE 08/2017 showed moderate focal BSH of the septum, EF 60-65%, grade 1 DD, high VFP, s/p AVR with mildly elevated mean gradient of 21 mmHg; no AI; trace TR with mild pulmonary hypertension; elevated velocity in descending aorta (2.7 m/s); suggested CTA or MRA to further assess coarctation. CT scan 09/2017 showed "some expected narrowing of the distal arch and proximal descending thoracic aorta at the level of prior coarctation repair. 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 This can be followed in the future by CTA or MRA. Elevated aortic velocity may be secondary to the underlying smoothly tapered narrowing in combination with significant tortuosity present. The lack of significant collateral arteries may argue against a hemodynamically significant recurrent stenosis at the level of prior repair."  Dr. Meda Coffee felt there was some expected narrowing but not significant and recommended continued surveillance. She was seen in the ED 02/2018 with acute on chronic dizziness with unrevealing workup. She declined MRI at that time. Meclizine resolved her symptoms. She was previously offered furosemide for her chronic edema but opted against this.  Was seen in follow-up by HLD clinic and was ultimately placed on repatha due to statin intolerance and GI upset with zetia.  Was last seen by Cecilie Kicks on 12/2020 where she was doing well.   Last TTE 06/22/21 with LVEF 60-65%, mild LVH, G1DD, normal RV, mild LAE, mean AoV gradient 27mmHg, Vmax 3.25m/s. Turbulence in the prox descending aorta with peak gradient 38mmHg consistent with residual coarctation.  Today, ***  Today, ***    Past Medical History:  Diagnosis Date   Allergic rhinitis    Anxiety    Aortic stenosis    Aphakia of left eye    Aphakia of left eye 06/29/2014   Bicuspid aortic valve    Blindness of left eye    Cervical prolapse 09/14/2014   Stage 3 cervical prolapse with urge incontinence per Gynecology. 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   Overview:  - congential deafness - congenital blindness in Left Eye - congenital aortic stenosis   Deafness congenital    Drusen of right optic disc    Environmental and seasonal allergies    Gastric stress ulcer    GERD (gastroesophageal reflux disease) 1990   H/O aortic coarctation repair    H/O aortic valve replacement  01/13/2016   Congenital biscuspid aortic valve s/p replacement 03/2015 and coarctation of aorta s/p repair in childhood   H/O coarctation of aorta 01/13/2016   HLD (hyperlipidemia) 07/17/2013   Overview:  - Lipid at goal (ASCVD 10-yr risk 3.1%) on Simvastatin 20 mg , d/c statin in 2016 for myalgias. Started red rice yeast. Statin restarted in hospital and now  patient off all meds for this  Last Assessment & Plan:  Remains at goal    HTN (hypertension) 01/13/2016   Overview:  - Goal SBP <140 - Lisinopril 10 mg p.o. (07/20/19 decreased to 10 mg from 20 mg for dizziness symptoms).  -    Hyperlipidemia 1997   Hypertension 1997   Hypothyroidism    Hypothyroidism (acquired) 07/17/2013   Overview:  - secondary to iatrogenic thyroidectomy for benign nodule - levothyroxine 88 mg/day   Nuclear sclerosis of right eye    Obesity    Obesity, Class II, BMI 35-39.9 07/17/2013   Overview:  - weight stable with diet and exercise - consider bariatric surgery given associated HTN   Rubella syndrome, congenital    S/P AVR (aortic valve replacement)    Scoliosis    Uterine prolapse    Ventral hernia without obstruction or gangrene 07/21/2019   Vision impairment    Vision loss, left eye     Past Surgical History:  Procedure Laterality Date   AORTIC VALVE REPLACEMENT  2017   breast biopsies     2001, 2002, & 2005    BREAST EXCISIONAL BIOPSY Left    CARDIAC CATHETERIZATION     COARCTATION OF AORTA REPAIR     repaired at age 8   EYE SURGERY  1964   blindness caused by german measles   RADIOLOGY WITH ANESTHESIA N/A 02/07/2021   Procedure: MRI WITH ANESTHESIA- BRAIN WITH /WITHOUT  CONSTRAST ORBITS WITH / WITHOUT CONSTRAST;  Surgeon: Radiologist, Medication, MD;  Location: [facility] OR;  Service: Radiology;  Laterality: N/A;   THYROID SURGERY  1997   benign mass    Current Medications: No outpatient medications have been marked as taking for the 12/28/21 encounter (Appointment) with Meriam Sprague, MD.     Allergies:   Statins, Atorvastatin, Erythromycin, Furosemide, Hydrocodone-acetaminophen, Meloxicam, Metoprolol, Montelukast, Omeprazole, Potassium chloride, and Rosuvastatin   Social History   Socioeconomic History   Marital status: Single    Spouse name: Not on file   Number of children: Not on file   Years of education: some college   Highest education  level: Not on file  Occupational History    Comment: unemployed  Tobacco Use   Smoking status: Never   Smokeless tobacco: Never  Vaping Use   Vaping Use: Never used  Substance and Sexual Activity   Alcohol use: Never   Drug use: Never   Sexual activity: Never  Other Topics Concern   Not on file  Social History Narrative   Lives alone   Right handed   Caffeine: chocolate only, not often   Social Determinants of Corporate investment banker Strain: Not on file  Food Insecurity: Not on file  Transportation Needs: Not on file  Physical Activity: Not on file  Stress: Not on file  Social Connections: Not on file     Family History: The patient's ***family history includes Arthritis in her brother and father; Asthma in her mother; Breast cancer in an other family member; Diabetes in her paternal grandmother; GER disease in her father; Heart murmur in her father; Hyperlipidemia in her  father; Other in her mother; Rheumatic fever in her paternal grandmother; Sinusitis in her brother. There is no history of Heart disease.  ROS:   Please see the history of present illness.    *** All other systems reviewed and are negative.  EKGs/Labs/Other Studies Reviewed:    The following studies were reviewed today: TTE 07/07/2021: IMPRESSIONS     1. Left ventricular ejection fraction, by estimation, is 60 to 65%. The  left ventricle has normal function. The left ventricle has no regional  wall motion abnormalities. There is mild left ventricular hypertrophy of  the basal-septal segment. Left  ventricular diastolic parameters are consistent with Grade I diastolic  dysfunction (impaired relaxation).   2. Right ventricular systolic function is normal. The right ventricular  size is normal.   3. Left atrial size was mildly dilated.   4. The mitral valve is normal in structure. No evidence of mitral valve  regurgitation. No evidence of mitral stenosis.   5. Aortic valve gradients are  moderately increased but not significantly  changed since 2022. The aortic valve has been repaired/replaced. Aortic  valve regurgitation is not visualized. Moderate aortic valve stenosis.  Aortic valve area, by VTI measures  0.79 cm. Aortic valve mean gradient measures 24.0 mmHg. Aortic valve Vmax  measures 3.26 m/s.   6. The inferior vena cava is normal in size with greater than 50%  respiratory variability, suggesting right atrial pressure of 3 mmHg.   7. There is turbulence in proximal descending aorta. Peak gradient  . Consistent with residual coarctation stenosis.    EKG:  EKG is *** ordered today.  The ekg ordered today demonstrates ***  Recent Labs: 02/06/2021: ALT 27 02/11/2021: BUN 31; Creatinine, Ser 0.72; Hemoglobin 13.5; Platelets 238; Potassium 3.4; Sodium 140 07/27/2021: TSH 0.925  Recent Lipid Panel    Component Value Date/Time   CHOL 138 07/24/2021 1054   TRIG 170 (H) 07/24/2021 1054   HDL 49 07/24/2021 1054   CHOLHDL 2.8 07/24/2021 1054   CHOLHDL 2.8 02/06/2021 0334   VLDL 40 02/06/2021 0334   LDLCALC 61 07/24/2021 1054   LDLDIRECT 135 (H) 12/13/2016 1440     Risk Assessment/Calculations:   {Does this patient have ATRIAL FIBRILLATION?:[phone]}  No BP recorded.  {Refresh Note OR Click here to enter BP  :1}***         Physical Exam:    VS:  LMP  (LMP Unknown)     Wt Readings from Last 3 Encounters:  08/16/21 165 lb (74.8 kg)  07/27/21 163 lb (73.9 kg)  01/05/21 163 lb (73.9 kg)     GEN: *** Well nourished, well developed in no acute distress HEENT: Normal NECK: No JVD; No carotid bruits LYMPHATICS: No lymphadenopathy CARDIAC: ***RRR, no murmurs, rubs, gallops RESPIRATORY:  Clear to auscultation without rales, wheezing or rhonchi  ABDOMEN: Soft, non-tender, non-distended MUSCULOSKELETAL:  No edema; No deformity  SKIN: Warm and dry NEUROLOGIC:  Alert and oriented x 3 PSYCHIATRIC:  Normal affect   ASSESSMENT:    No diagnosis  found. PLAN:    In order of problems listed above:  #History of Bicuspid Aortic Valve S/p AVR in 2017: Underwent aortic valve replacement with annulus enlargement (19 mm Magna model 300 Carpentier-Edwards bovine pericardial heart valve) on 04/17/15 at [facility]. Pre-op cath without significant disease. TTEs with elevated gradients but have been relatively stable with last mean gradient . Currently, doing well.  -Continue yearly echoes for monitoring -Continue SBE ppx -Continue ASA 81mg   daily  #Coarctation of the Aorta: S/p repair at age 72 now with residual coarctation with mean gradient . Recommend serial monitoring with CTA/MRA. Needs aggressive BP control as below.   #Chronic Diastolic HF: TTE with LVEF 60-65%, G1DD. Has chronic LE edema and has declined lasix. -Declined lasix -Continue lisinopril 10mg  daily -Low Na diet  #HTN: -Continue lisinopril 10mg  daily -Continue HCTZ 25mg  daily  #HLD: -Continue praluent 75mg  q 2 weeks     {Are you ordering a CV Procedure (e.g. stress test, cath, DCCV, TEE, etc)?   Press F2        :    Medication Adjustments/Labs and Tests Ordered: Current medicines are reviewed at length with the patient today.  Concerns regarding medicines are outlined above.  No orders of the defined types were placed in this encounter.  No orders of the defined types were placed in this encounter.   There are no Patient Instructions on file for this visit.   Signed, , MD  12/27/2021 7:32 PM    [facility]

## 2021-12-27 NOTE — Telephone Encounter (Signed)
Error

## 2021-12-28 ENCOUNTER — Other Ambulatory Visit: Payer: Self-pay | Admitting: Pharmacist

## 2021-12-28 ENCOUNTER — Ambulatory Visit: Payer: Medicare HMO | Attending: Cardiology | Admitting: Cardiology

## 2021-12-28 ENCOUNTER — Ambulatory Visit: Payer: Medicare HMO | Admitting: Cardiology

## 2021-12-28 ENCOUNTER — Encounter: Payer: Self-pay | Admitting: Cardiology

## 2021-12-28 VITALS — BP 123/73 | HR 86 | Ht <= 58 in | Wt 166.0 lb

## 2021-12-28 DIAGNOSIS — E785 Hyperlipidemia, unspecified: Secondary | ICD-10-CM

## 2021-12-28 DIAGNOSIS — Q2381 Bicuspid aortic valve: Secondary | ICD-10-CM

## 2021-12-28 DIAGNOSIS — Z952 Presence of prosthetic heart valve: Secondary | ICD-10-CM | POA: Diagnosis not present

## 2021-12-28 DIAGNOSIS — I10 Essential (primary) hypertension: Secondary | ICD-10-CM | POA: Diagnosis not present

## 2021-12-28 DIAGNOSIS — E039 Hypothyroidism, unspecified: Secondary | ICD-10-CM

## 2021-12-28 DIAGNOSIS — Z8774 Personal history of (corrected) congenital malformations of heart and circulatory system: Secondary | ICD-10-CM | POA: Diagnosis not present

## 2021-12-28 DIAGNOSIS — I5032 Chronic diastolic (congestive) heart failure: Secondary | ICD-10-CM

## 2021-12-28 DIAGNOSIS — Q231 Congenital insufficiency of aortic valve: Secondary | ICD-10-CM

## 2021-12-28 MED ORDER — ALBUTEROL SULFATE HFA 108 (90 BASE) MCG/ACT IN AERS: 2.0000 | INHALATION_SPRAY | RESPIRATORY_TRACT | 3 refills | Status: DC | PRN

## 2021-12-28 MED ORDER — HYDROCHLOROTHIAZIDE 25 MG PO TABS: ORAL_TABLET | ORAL | 3 refills | Status: DC

## 2021-12-28 MED ORDER — LEVOTHYROXINE SODIUM 100 MCG PO TABS: 100.0000 ug | ORAL_TABLET | Freq: Every day | ORAL | 3 refills | Status: DC

## 2021-12-28 MED ORDER — PRALUENT 75 MG/ML ~~LOC~~ SOAJ: 1.0000 "pen " | SUBCUTANEOUS | 3 refills | Status: DC

## 2021-12-28 MED ORDER — LISINOPRIL 10 MG PO TABS: ORAL_TABLET | ORAL | 3 refills | Status: DC

## 2021-12-28 MED ORDER — AMOXICILLIN 500 MG PO TABS: ORAL_TABLET | ORAL | 4 refills | Status: DC

## 2021-12-28 NOTE — Progress Notes (Signed)
Cardiology Office Note:    Date:  12/28/2021   ID:  Ashley Torres, DOB [birth year], MRN [id number]  PCP:  [name], [name], DO   [facility] Providers Cardiologist:  Tobias AlexanderKatarina Nelson, MD {   Referring MD: [name], [name], DO    History of Present Illness:    Ashley Torres is a 66 y.o. female with a hx of congenital deafness, bicuspid aortic valve status post bioprosthetic aortic valve replacement in 03/2015, coarctation of the aorta status post repair as a child, hypertension, hyperlipidemia, hypothyroidism, chronic lower extremity edema, chronic sinusitis, and GERD who presents to clinic for follow-up.   Per Dr. [name]'s notes, she had coarctation repair around 66 years of age. In 2016 she was found to have severe aortic stenosis. [facility] showed normal coronaries. She subsequently underwent aortic valve replacement with annulus enlargement (19 mm Magna model 300 Carpentier-Edwards bovine pericardial heart valve) [facility]. TTE 08/2017 showed moderate focal BSH of the septum, EF 60-65%, grade 1 DD, high VFP, s/p AVR with mildly elevated mean gradient of 21 mmHg; no AI; trace TR with mild pulmonary hypertension; elevated velocity in descending aorta (2.7 m/s); suggested CTA or MRA to further assess coarctation. CT scan 09/2017 showed "some expected narrowing of the distal arch and proximal descending thoracic aorta at the level of prior coarctation repair. 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 This can be followed in the future by CTA or MRA. Elevated aortic velocity may be secondary to the underlying smoothly tapered narrowing in combination with significant tortuosity present. The lack of significant collateral arteries may argue against a hemodynamically significant recurrent stenosis at the level of prior repair."  Dr. Delton SeeNelson felt there was some expected narrowing but not significant and recommended continued surveillance. She was seen in the ED 02/2018 with acute on chronic dizziness with unrevealing workup. She declined MRI at that time. Meclizine resolved her symptoms. She was previously offered furosemide for her chronic edema but opted against this.  Was seen in follow-up by HLD clinic and was ultimately placed on repatha due to statin intolerance and GI upset with zetia.  Was last seen by [name] on 12/2020 where she was doing well.   Last TTE 06/22/21 with LVEF 60-65%, mild LVH, G1DD, normal RV, mild LAE, mean AoV gradient 24mmHg, Vmax 3.1251m/s. Turbulence in the prox descending aorta with peak gradient 30mmHg consistent with residual coarctation.  Today, she is accompanied by a professional interpreter. She states she feels overall well. No chest pain, SOB, orthopnea or PND. She reports her blood pressure is elevated because she is nervous. It did improve to 120/70s on recheck. She has chronic LE edema that is well controlled with HCTZ. She is compliant with her medications. Does not have a BP cuff at home.   She is planning to move to [place] to help her father.   Past Medical History:  Diagnosis Date   Allergic rhinitis    Anxiety    Aortic stenosis    Aphakia of left eye    Aphakia of left eye 06/29/2014   Bicuspid aortic valve    Blindness of left eye    Cervical prolapse 09/14/2014   Stage 3 cervical prolapse with urge incontinence per Gynecology. 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   Overview:  - congential deafness - congenital blindness in Left Eye - congenital aortic stenosis   Deafness congenital    Drusen of right optic disc    Environmental and seasonal allergies    Gastric stress ulcer    GERD (gastroesophageal reflux disease) 1990   H/O aortic coarctation repair     H/O aortic valve replacement 01/13/2016   Congenital biscuspid aortic valve s/p replacement 03/2015 and coarctation of aorta s/p repair in childhood   H/O coarctation of aorta 01/13/2016   HLD (hyperlipidemia) 07/17/2013   Overview:  - Lipid at goal (ASCVD 10-yr risk 3.1%) on Simvastatin 20 mg , d/c statin in 2016 for myalgias. Started red rice yeast. Statin restarted in hospital and now patient off all meds for this  Last Assessment & Plan:  Remains at goal    HTN (hypertension) 01/13/2016   Overview:  - Goal SBP <140 - Lisinopril 10 mg p.o. (07/20/19 decreased to 10 mg from 20 mg for dizziness symptoms).  -    Hyperlipidemia 1997   Hypertension 1997   Hypothyroidism    Hypothyroidism (acquired) 07/17/2013   Overview:  - secondary to iatrogenic thyroidectomy for benign nodule - levothyroxine 88 mg/day   Nuclear sclerosis of right eye    Obesity    Obesity, Class II, BMI 35-39.9 07/17/2013   Overview:  - weight stable with diet and exercise - consider bariatric surgery given associated HTN   Rubella syndrome, congenital    S/P AVR (aortic valve replacement)    Scoliosis    Uterine prolapse    Ventral hernia without obstruction or gangrene 07/21/2019   Vision impairment    Vision loss, left eye     Past Surgical History:  Procedure Laterality Date   AORTIC VALVE REPLACEMENT  2017   breast biopsies     2001, 2002, & 2005    BREAST EXCISIONAL BIOPSY Left    CARDIAC CATHETERIZATION     COARCTATION OF AORTA REPAIR     repaired at age 4   EYE SURGERY  1964   blindness caused by german measles   RADIOLOGY WITH ANESTHESIA N/A 02/07/2021   Procedure: MRI WITH ANESTHESIA- BRAIN WITH /WITHOUT  CONSTRAST ORBITS WITH / WITHOUT CONSTRAST;  Surgeon: Radiologist, Medication, MD;  Location: [facility] OR;  Service: Radiology;  Laterality: N/A;   THYROID SURGERY  1997   benign mass    Current Medications: Current Meds  Medication Sig   acetaminophen (TYLENOL) 500 MG tablet Take 500 mg by mouth daily as  needed for moderate pain.    Alirocumab (PRALUENT) 75 MG/ML SOAJ Inject 1 pen into the skin every 14 (fourteen) days.   ALPHAGAN P 0.1 % SOLN Place 1 drop into the right eye 2 (two) times daily.   aspirin EC 81 MG tablet Take 81 mg by mouth daily.   brimonidine (ALPHAGAN P) 0.1 % SOLN Apply to eye.   Calcium Carb-Cholecalciferol [id number] MG-UNIT CAPS Take 1 tablet by mouth daily.   calcium carbonate (TUMS - DOSED IN MG ELEMENTAL CALCIUM) 500 MG chewable tablet Chew 1 tablet by mouth daily as needed for indigestion.   chlorpheniramine (CHLOR-TRIMETON) 4 MG tablet Take 4 mg by mouth daily as needed for allergies.   famotidine (PEPCID) 10 MG tablet Take 1 tablet (10 mg total) by mouth daily.   fexofenadine (ALLEGRA) 180 MG tablet Take 180 mg by mouth daily as needed for allergies.   FOLIC ACID PO Take 1  tablet by mouth daily.   GLYCERIN-HYPROMELLOSE-PEG 400 OP Place 1 drop into both eyes daily as needed (Dry eyes).   meclizine (ANTIVERT) 25 MG tablet Take 1 tablet (25 mg total) by mouth 3 (three) times daily as needed for dizziness.   Multiple Vitamins-Minerals (MULTIVITAMIN ADULTS 50+ PO) Take 1 tablet by mouth daily.   Multiple Vitamins-Minerals (OCUVITE PRESERVISION PO) Take 1 tablet by mouth daily.   vitamin C (ASCORBIC ACID) 250 MG tablet Take 250 mg by mouth daily.   Vitamin E 100 units TABS Take 100 Units by mouth daily.   [DISCONTINUED] albuterol (VENTOLIN HFA) 108 (90 Base) MCG/ACT inhaler Inhale 2 puffs into the lungs every 4 (four) hours as needed for wheezing or shortness of breath.   [DISCONTINUED] amoxicillin (AMOXIL) 500 MG tablet TAKE 4 TABLETS BY MOUTH 30 TO 60 MINUTES PRIOR TO DENTAL PROCEDURES   [DISCONTINUED] hydrochlorothiazide (HYDRODIURIL) 25 MG tablet Take 1 tablet daily by mouth   [DISCONTINUED] levothyroxine (SYNTHROID) 100 MCG tablet Take 1 tablet (100 mcg total) by mouth daily before breakfast.   [DISCONTINUED] lisinopril (ZESTRIL) 10 MG tablet TAKE 1 TABLET(10 MG) BY  MOUTH DAILY     Allergies:   Statins, Atorvastatin, Erythromycin, Furosemide, Hydrocodone-acetaminophen, Meloxicam, Metoprolol, Montelukast, Omeprazole, Potassium chloride, and Rosuvastatin   Social History   Socioeconomic History   Marital status: Single    Spouse name: Not on file   Number of children: Not on file   Years of education: some college   Highest education level: Not on file  Occupational History    Comment: unemployed  Tobacco Use   Smoking status: Never   Smokeless tobacco: Never  Vaping Use   Vaping Use: Never used  Substance and Sexual Activity   Alcohol use: Never   Drug use: Never   Sexual activity: Never  Other Topics Concern   Not on file  Social History Narrative   Lives alone   Right handed   Caffeine: chocolate only, not often   Social Determinants of Radio broadcast assistant Strain: Not on file  Food Insecurity: Not on file  Transportation Needs: Not on file  Physical Activity: Not on file  Stress: Not on file  Social Connections: Not on file     Family History: The patient's family history includes Arthritis in her brother and father; Asthma in her mother; Breast cancer in an other family member; Diabetes in her paternal grandmother; GER disease in her father; Heart murmur in her father; Hyperlipidemia in her father; Other in her mother; Rheumatic fever in her paternal grandmother; Sinusitis in her brother. There is no history of Heart disease.  ROS:   Review of Systems  Constitutional:  Negative for chills and fever.  HENT:  Negative for nosebleeds and tinnitus.   Eyes:  Negative for photophobia and discharge.  Respiratory:  Negative for cough and hemoptysis.   Cardiovascular:  Positive for leg swelling. Negative for chest pain, palpitations, orthopnea, claudication and PND.  Gastrointestinal:  Negative for diarrhea and heartburn.  Genitourinary:  Negative for dysuria.  Musculoskeletal:  Negative for falls.  Neurological:  Negative  for dizziness and tremors.  Endo/Heme/Allergies:  Positive for environmental allergies.  Psychiatric/Behavioral:  Negative for hallucinations and suicidal ideas.      EKGs/Labs/Other Studies Reviewed:    The following studies were reviewed today:  TTE 06/22/21: IMPRESSIONS    1. Left ventricular ejection fraction, by estimation, is 60 to 65%. The  left ventricle has normal function. The left ventricle has no regional  wall motion abnormalities. There is mild left ventricular hypertrophy of  the basal-septal segment. Left  ventricular diastolic parameters are consistent with Grade I diastolic  dysfunction (impaired relaxation).   2. Right ventricular systolic function is normal. The right ventricular  size is normal.   3. Left atrial size was mildly dilated.   4. The mitral valve is normal in structure. No evidence of mitral valve  regurgitation. No evidence of mitral stenosis.   5. Aortic valve gradients are moderately increased but not significantly  changed since 2022. The aortic valve has been repaired/replaced. Aortic  valve regurgitation is not visualized. Moderate aortic valve stenosis.  Aortic valve area, by VTI measures  0.79 cm. Aortic valve mean gradient measures 24.0 mmHg. Aortic valve Vmax  measures 3.26 m/s.   6. The inferior vena cava is normal in size with greater than 50%  respiratory variability, suggesting right atrial pressure of 3 mmHg.   7. There is turbulence in proximal descending aorta. Peak gradient  . Consistent with residual coarctation stenosis.   CT Angio Head/Neck 02/07/21:  IMPRESSION:  1. No acute intracranial process. 2. No intracranial large vessel occlusion or significant stenosis. 3. No dural venous sinus thrombosis or stenosis. 4. Partially imaged aortic arch, with unchanged postoperative appearance of the distal arch, better evaluated on 07/01/2019.    EKG:  EKG was personally reviewed. 12/28/21, EKG was not ordered.   02/05/21, (ED), Borderline prolonged PR interval, LAE, consider biatrial enlargement, Right bundle branch block, Left ventricular hypertrophy, ST elevation, consider inferior injury, 94 bpm   Recent Labs: 02/06/2021: ALT 27 02/11/2021: BUN 31; Creatinine, Ser 0.72; Hemoglobin 13.5; Platelets 238; Potassium 3.4; Sodium 140 07/27/2021: TSH 0.925  Recent Lipid Panel    Component Value Date/Time   CHOL 138 07/24/2021 1054   TRIG 170 (H) 07/24/2021 1054   HDL 49 07/24/2021 1054   CHOLHDL 2.8 07/24/2021 1054   CHOLHDL 2.8 02/06/2021 0334   VLDL 40 02/06/2021 0334   LDLCALC 61 07/24/2021 1054   LDLDIRECT 135 (H) 12/13/2016 1440     Risk Assessment/Calculations:                Physical Exam:    VS:  BP 123/73 (BP Location: Right Arm, Patient Position: Sitting, Cuff Size: Normal)   Pulse 86   Ht 4' 9.5" (1.461 m)   Wt 166 lb (75.3 kg)   LMP  (LMP Unknown)   SpO2 95%   BMI 35.30 kg/m     Wt Readings from Last 3 Encounters:  12/28/21 166 lb (75.3 kg)  08/16/21 165 lb (74.8 kg)  07/27/21 163 lb (73.9 kg)     GEN: Comfortable, congenitally deaf HEENT: Normal NECK: No JVD; No carotid bruits CARDIAC: RRR, 3/6 early peaking systolic murmur RESPIRATORY:  Clear to auscultation without rales, wheezing or rhonchi  ABDOMEN: Soft, non-tender, non-distended MUSCULOSKELETAL:  Trace-1+ LE edema, warm SKIN: Warm and dry NEUROLOGIC:  Alert and oriented x 3 PSYCHIATRIC:  Normal affect   ASSESSMENT:    1. Bicuspid aortic valve   2. Essential hypertension   3. Hypothyroidism (acquired)   4. H/O coarctation of aorta   5. S/P AVR (aortic valve replacement)   6. Hyperlipidemia, unspecified hyperlipidemia type   7. Chronic diastolic heart failure (HCC)    PLAN:    In order of problems listed above:  #History of Bicuspid Aortic Valve S/p AVR in 2017: Underwent aortic valve replacement with annulus enlargement (19 mm Magna model 300 Carpentier-Edwards bovine pericardial heart  valve)  on 04/17/15 at [facility]. Pre-op cath without significant disease. TTEs with elevated gradients but have been relatively stable with last mean gradient . Currently, doing well with no chest pain, SOB, lightheadedness or dizziness.  -Continue yearly echoes for monitoring -Continue SBE ppx -Continue ASA 81mg  daily  #Coarctation of the Aorta: S/p repair at age 24 now with residual coarctation with mean gradient 5. Recommend serial monitoring with CTA/MRA. Needs aggressive BP control as below.  -Check CTA aorta -BP control as below  #Chronic Diastolic HF: TTE with LVEF 60-65%, G1DD. Has chronic LE edema which is fairly well controlled with HCTZ.  -Continue HCTZ 25mg  daily (has allergy to lasix) -Continue lisinopril 10mg  daily -Low Na diet  #HTN: Elevated initially but improved to 120/70 on recheck. -Continue lisinopril 10mg  daily -Continue HCTZ 25mg  daily  #HLD: -Continue praluent 75mg  q 2 weeks -Last LDL controlled at 61         Follow-up: 47-months    Medication Adjustments/Labs and Tests Ordered: Current medicines are reviewed at length with the patient today.  Concerns regarding medicines are outlined above.  Orders Placed This Encounter  Procedures   CT ANGIO CHEST AORTA W/CM & OR WO/CM   Basic metabolic panel   Meds ordered this encounter  Medications   lisinopril (ZESTRIL) 10 MG tablet    Sig: TAKE 1 TABLET(10 MG) BY MOUTH DAILY    Dispense:  90 tablet    Refill:  3   levothyroxine (SYNTHROID) 100 MCG tablet    Sig: Take 1 tablet (100 mcg total) by mouth daily before breakfast.    Dispense:  90 tablet    Refill:  3   hydrochlorothiazide (HYDRODIURIL) 25 MG tablet    Sig: Take 1 tablet daily by mouth    Dispense:  90 tablet    Refill:  3   amoxicillin (AMOXIL) 500 MG tablet    Sig: TAKE 4 TABLETS BY MOUTH 30 TO 60 MINUTES PRIOR TO DENTAL PROCEDURES    Dispense:  4 tablet    Refill:  4   albuterol (VENTOLIN HFA) 108 (90  Base) MCG/ACT inhaler    Sig: Inhale 2 puffs into the lungs every 4 (four) hours as needed for wheezing or shortness of breath.    Dispense:  6.7 g    Refill:  3    Patient Instructions  Medication Instructions:   Your physician recommends that you continue on your current medications as directed. Please refer to the Current Medication list given to you today.  *If you need a refill on your cardiac medications before your next appointment, please call your pharmacy*   Testing/Procedures:  CT ANGIO CHEST AORTA W/WO CONTRAST--TO BE DONE NOW   Follow-Up: At [facility], you and your health needs are our priority.  As part of our continuing mission to provide you with exceptional heart care, we have created designated Provider Care Teams.  These Care Teams include your primary Cardiologist (physician) and Advanced Practice Providers (APPs -  Physician Assistants and Nurse Practitioners) who all work together to provide you with the care you need, when you need it.  We recommend signing up for the patient portal called "MyChart".  Sign up information is provided on this After Visit Summary.  MyChart is used to connect with patients for Virtual Visits (Telemedicine).  Patients are able to view lab/test results, encounter notes, upcoming appointments, etc.  Non-urgent messages can be sent to your provider as well.   To learn more about  what you can do with MyChart, go to [email or web address].    Your next appointment:   6 month(s)  The format for your next appointment:   In Person  Provider:   DR. Shari Prows   Important Information About Sugar          I,Mathew Stumpf,acting as a scribe for Meriam Sprague, MD.,have documented all relevant documentation on the behalf of Meriam Sprague, MD,as directed by  Meriam Sprague, MD while in the presence of Meriam Sprague, MD.   I, Meriam Sprague, MD, have reviewed all documentation for this visit. The  documentation on 12/28/21 for the exam, diagnosis, procedures, and orders are all accurate and complete.   Signed, Meriam Sprague, MD  12/28/2021 11:34 AM     [facility]

## 2021-12-28 NOTE — Patient Instructions (Signed)
Medication Instructions:   Your physician recommends that you continue on your current medications as directed. Please refer to the Current Medication list given to you today.  *If you need a refill on your cardiac medications before your next appointment, please call your pharmacy*   Testing/Procedures:  CT ANGIO CHEST AORTA W/WO CONTRAST--TO BE DONE NOW   Follow-Up: At [facility], you and your health needs are our priority.  As part of our continuing mission to provide you with exceptional heart care, we have created designated Provider Care Teams.  These Care Teams include your primary Cardiologist (physician) and Advanced Practice Providers (APPs -  Physician Assistants and Nurse Practitioners) who all work together to provide you with the care you need, when you need it.  We recommend signing up for the patient portal called "MyChart".  Sign up information is provided on this After Visit Summary.  MyChart is used to connect with patients for Virtual Visits (Telemedicine).  Patients are able to view lab/test results, encounter notes, upcoming appointments, etc.  Non-urgent messages can be sent to your provider as well.   To learn more about what you can do with MyChart, go to [email or web address].    Your next appointment:   6 month(s)  The format for your next appointment:   In Person  Provider:   DR. Johney Frame   Important Information About Sugar

## 2022-01-03 ENCOUNTER — Ambulatory Visit (INDEPENDENT_AMBULATORY_CARE_PROVIDER_SITE_OTHER): Payer: Medicare HMO | Admitting: Student

## 2022-01-03 ENCOUNTER — Encounter: Payer: Self-pay | Admitting: Student

## 2022-01-03 VITALS — BP 122/80 | HR 82 | Ht <= 58 in | Wt 166.6 lb

## 2022-01-03 DIAGNOSIS — I10 Essential (primary) hypertension: Secondary | ICD-10-CM | POA: Diagnosis not present

## 2022-01-03 DIAGNOSIS — H469 Unspecified optic neuritis: Secondary | ICD-10-CM

## 2022-01-03 NOTE — Assessment & Plan Note (Signed)
Plan per ophthalmologist. Filled out transportation forms today.

## 2022-01-03 NOTE — Progress Notes (Signed)
    SUBJECTIVE:   CHIEF COMPLAINT / HPI:   Ashley Torres is a 66 year old female here for blood pressure follow-up and to have transportation forms filled out.  She tells me that her vision has been worsening, was seen by her ophthalmologist and had 20/25 OD with full color vision, with optic nerve drusens and mild superotemporal pallor OS.  She did have a new decrease in her baseline vision. Given her scoliosis, language barrier using ASL and vision difficulties, she she is requiring assistance with transportation.  Regarding blood pressure, she says it is always high when she comes to the doctor that our initial reading was elevated.  Denies any chest pain, headache, shortness of breath.  She was asking when she will be due for labs and her Pap smear.  Of note, she is possibly moving to [place] next year to help with her father who had a neck fracture and has had some declining health.  In person ASL interpreter used throughout visit.  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 Filled out transportation forms today.  HTN (hypertension) At goal.  Repeat blood pressure 122/80.  Continue lisinopril 10 mg daily and hydrochlorothiazide 25 mg daily. BMP next visit    Discussed plan for Pap smear at visit next year, this will be her last one as she will be turning 42.  Orvis Brill, [place]

## 2022-01-03 NOTE — Progress Notes (Signed)
Forms

## 2022-01-03 NOTE — Assessment & Plan Note (Signed)
At goal.  Repeat blood pressure 122/80.  Continue lisinopril 10 mg daily and hydrochlorothiazide 25 mg daily. BMP next visit

## 2022-01-22 ENCOUNTER — Telehealth: Payer: Self-pay | Admitting: Cardiology

## 2022-01-22 DIAGNOSIS — E7849 Other hyperlipidemia: Secondary | ICD-10-CM

## 2022-01-22 DIAGNOSIS — Z8774 Personal history of (corrected) congenital malformations of heart and circulatory system: Secondary | ICD-10-CM

## 2022-01-22 DIAGNOSIS — E785 Hyperlipidemia, unspecified: Secondary | ICD-10-CM

## 2022-01-22 NOTE — Telephone Encounter (Signed)
Medication will be covered until the end of the year. Will switch to Repatha on 03/18/22.

## 2022-01-22 NOTE — Telephone Encounter (Signed)
Pt informed of plan. Pt agreeable to plan.

## 2022-01-22 NOTE — Telephone Encounter (Signed)
Patient was returning call. Please advise ?

## 2022-01-22 NOTE — Telephone Encounter (Signed)
Left message to call back  

## 2022-01-22 NOTE — Telephone Encounter (Signed)
Pt c/o medication issue:  1. Name of Medication: Alirocumab ([place]) 75 MG/ML SOAJ   2. How are you currently taking this medication (dosage and times per day)?   Inject 1 pen into the skin every 14 (fourteen) days.    3. Are you having a reaction (difficulty breathing--STAT)? no  4. What is your medication issue? Patient is call because she states that her insurance is not longer going to cover it. Please advise

## 2022-01-29 ENCOUNTER — Telehealth: Payer: Self-pay | Admitting: Cardiology

## 2022-01-29 NOTE — Telephone Encounter (Signed)
Called the pt back via sign language interpreter.   Pt endorsed to the sign language interpreter that she had several questions to ask our billing dept, in regards to upcoming CT she will be having on 11/16.  Pt states she has to ask them  if they received authorization or pre-certification through her insurance carrier for upcoming CT ANGIO CHEST AORTA that we ordered for her to have done on 11/16.  Pt aware that I don't know the complete answer to those questions but I will route this message and direct this to our billing department, so that they can call her back and assist her with any billing questions she has, for upcoming CT on this Thursday 11/16.  Pt verbalized understanding and agrees with this plan, via sign language interpreter.

## 2022-01-29 NOTE — Telephone Encounter (Signed)
New Message:     Please have the nurse to call her. It is about a test that she needs.

## 2022-01-29 NOTE — Telephone Encounter (Signed)
Spoke with our billing dept.  They will be calling the pt today to assist in answering any billing or pre-cert questions, regarding upcoming CT ANGIO CHEST AORTA on 11/16.

## 2022-01-31 ENCOUNTER — Inpatient Hospital Stay: Admission: RE | Admit: 2022-01-31 | Payer: Medicare HMO | Source: Ambulatory Visit

## 2022-02-12 ENCOUNTER — Encounter: Payer: Self-pay | Admitting: Pharmacist

## 2022-02-12 MED ORDER — REPATHA SURECLICK 140 MG/ML ~~LOC~~ SOAJ: 1.0000 mL | SUBCUTANEOUS | 3 refills | Status: DC

## 2022-02-12 NOTE — Addendum Note (Signed)
Addended by: Cheree Ditto on: 02/12/2022 12:40 PM   Modules accepted: Orders

## 2022-02-12 NOTE — Telephone Encounter (Signed)
Plan will now cover Repatha.  Rx sent to pharmacy

## 2022-02-18 ENCOUNTER — Telehealth: Payer: Self-pay | Admitting: Pharmacist

## 2022-02-18 MED ORDER — PRALUENT 75 MG/ML ~~LOC~~ SOAJ: 1.0000 mL | SUBCUTANEOUS | 3 refills | Status: DC

## 2022-02-18 NOTE — Telephone Encounter (Signed)
Preferred agent is still [employer] until the end of the year. Will send in Praluent Rx

## 2022-03-05 ENCOUNTER — Other Ambulatory Visit: Payer: Medicare HMO

## 2022-03-06 ENCOUNTER — Inpatient Hospital Stay: Admission: RE | Admit: 2022-03-06 | Payer: Medicare HMO | Source: Ambulatory Visit

## 2022-03-19 ENCOUNTER — Telehealth: Payer: Self-pay | Admitting: Pharmacist

## 2022-03-19 MED ORDER — REPATHA SURECLICK 140 MG/ML ~~LOC~~ SOAJ: 1.0000 | SUBCUTANEOUS | 3 refills | Status: DC

## 2022-03-19 NOTE — Telephone Encounter (Signed)
Pt's insurance formulary changed from [place] to [place] for 2024 year. Prior auth for Repatha submitted, Key: [id number]. Advised that PA not required, med is on formulary. Rx sent to pharmacy, left message making pt aware.

## 2022-04-05 ENCOUNTER — Ambulatory Visit
Admission: RE | Admit: 2022-04-05 | Discharge: 2022-04-05 | Disposition: A | Discharge status: Home / Self Care | Payer: Medicare HMO | Source: Ambulatory Visit | Attending: Cardiology | Admitting: Cardiology

## 2022-04-05 DIAGNOSIS — Z8774 Personal history of (corrected) congenital malformations of heart and circulatory system: Secondary | ICD-10-CM

## 2022-04-05 DIAGNOSIS — Q251 Coarctation of aorta: Secondary | ICD-10-CM | POA: Diagnosis not present

## 2022-04-05 DIAGNOSIS — R918 Other nonspecific abnormal finding of lung field: Secondary | ICD-10-CM | POA: Diagnosis not present

## 2022-04-05 DIAGNOSIS — Z952 Presence of prosthetic heart valve: Secondary | ICD-10-CM

## 2022-04-05 DIAGNOSIS — M419 Scoliosis, unspecified: Secondary | ICD-10-CM | POA: Diagnosis not present

## 2022-04-05 MED ORDER — IOPAMIDOL (ISOVUE-370) INJECTION 76%
75.0000 mL | Freq: Once | INTRAVENOUS | Status: AC | PRN
  Administered 2022-04-05: 75 mL via INTRAVENOUS

## 2022-04-10 ENCOUNTER — Other Ambulatory Visit: Payer: Self-pay | Admitting: Student

## 2022-04-10 DIAGNOSIS — Z1231 Encounter for screening mammogram for malignant neoplasm of breast: Secondary | ICD-10-CM

## 2022-04-19 DIAGNOSIS — H04123 Dry eye syndrome of bilateral lacrimal glands: Secondary | ICD-10-CM | POA: Diagnosis not present

## 2022-04-19 DIAGNOSIS — H47321 Drusen of optic disc, right eye: Secondary | ICD-10-CM | POA: Diagnosis not present

## 2022-04-19 DIAGNOSIS — H02886 Meibomian gland dysfunction of left eye, unspecified eyelid: Secondary | ICD-10-CM | POA: Diagnosis not present

## 2022-04-19 DIAGNOSIS — H02883 Meibomian gland dysfunction of right eye, unspecified eyelid: Secondary | ICD-10-CM | POA: Diagnosis not present

## 2022-04-19 DIAGNOSIS — H2511 Age-related nuclear cataract, right eye: Secondary | ICD-10-CM | POA: Diagnosis not present

## 2022-04-22 DIAGNOSIS — H35033 Hypertensive retinopathy, bilateral: Secondary | ICD-10-CM | POA: Diagnosis not present

## 2022-04-22 DIAGNOSIS — H43821 Vitreomacular adhesion, right eye: Secondary | ICD-10-CM | POA: Diagnosis not present

## 2022-04-22 DIAGNOSIS — H47011 Ischemic optic neuropathy, right eye: Secondary | ICD-10-CM | POA: Diagnosis not present

## 2022-04-22 DIAGNOSIS — H35361 Drusen (degenerative) of macula, right eye: Secondary | ICD-10-CM | POA: Diagnosis not present

## 2022-04-25 ENCOUNTER — Encounter: Payer: Self-pay | Admitting: Family Medicine

## 2022-04-25 ENCOUNTER — Ambulatory Visit (INDEPENDENT_AMBULATORY_CARE_PROVIDER_SITE_OTHER): Payer: Medicare HMO | Admitting: Family Medicine

## 2022-04-25 VITALS — HR 84

## 2022-04-25 DIAGNOSIS — J320 Chronic maxillary sinusitis: Secondary | ICD-10-CM

## 2022-04-25 DIAGNOSIS — J0100 Acute maxillary sinusitis, unspecified: Secondary | ICD-10-CM

## 2022-04-25 MED ORDER — AMOXICILLIN-POT CLAVULANATE 875-125 MG PO TABS: 1.0000 | ORAL_TABLET | Freq: Two times a day (BID) | ORAL | 0 refills | Status: AC

## 2022-04-25 NOTE — Patient Instructions (Signed)
It was great seeing you today!  For your sinus infection I have printed out a prescription for Augmentin to take twice a day for 10 days if your symptoms persist beyond another 3 days.  At that point we have more concern for a bacterial rather than viral infection.  These return if you are still experiencing the symptoms despite your 10-day course of Augmentin if that is needed, but if you need to be seen earlier than that for any new issues we're happy to fit you in, just give [place] a call!  Feel free to call with any questions or concerns at any time, at [phone].   Take care,  Dr. Shary Key [facility]

## 2022-04-25 NOTE — Progress Notes (Signed)
    SUBJECTIVE:   CHIEF COMPLAINT / HPI:   Patient presents for possible sinus infection. Endorses A lot of sinus pressure and mucuous for the past week starting Friday. Feels like head is heavy. Has thick mucous in back of throat. Has been coughing because so much drainage. No fever or chills. Has been using Allegra for allergies, Tylenol for headache, and also Chlorpheniramine. Does not use Flonase says it worsens her sinus infections   PERTINENT  PMH / PSH: Reviewed   OBJECTIVE:   Pulse 84   LMP  (LMP Unknown)   SpO2 96%    General: alert, NAD Heent: MMM. Oropharynx non erythematous and without lesions. R TM clear and bulging. L TM normal. Mild tenderness over maxillary sinus CV: RRR no murmurs Resp: CTAB normal WOB  GI: soft, non distended    ASSESSMENT/PLAN:   Sinus infection Patient presents for thick mucous, maxillary tenderness, post nasal drip resulting in coughing x 1 wk duration. Since symptoms have been going on for a week and less than 10 days wrote a Rx for Augmentin if symptoms do not improve over the next 3 days. I think she would also benefit from [facility] given her allergy symptoms and postnasal but she declines. Return precautions discussed.     [place]

## 2022-04-27 DIAGNOSIS — J329 Chronic sinusitis, unspecified: Secondary | ICD-10-CM | POA: Insufficient documentation

## 2022-04-27 NOTE — Assessment & Plan Note (Signed)
Patient presents for thick mucous, maxillary tenderness, post nasal drip resulting in coughing x 1 wk duration. Since symptoms have been going on for a week and less than 10 days wrote a Rx for Augmentin if symptoms do not improve over the next 3 days. I think she would also benefit from [facility] given her allergy symptoms and postnasal but she declines. Return precautions discussed.

## 2022-05-29 ENCOUNTER — Ambulatory Visit
Admission: RE | Admit: 2022-05-29 | Discharge: 2022-05-29 | Disposition: A | Discharge status: Home / Self Care | Payer: Medicare HMO | Source: Ambulatory Visit | Attending: *Deleted | Admitting: *Deleted

## 2022-05-29 DIAGNOSIS — Z1231 Encounter for screening mammogram for malignant neoplasm of breast: Secondary | ICD-10-CM | POA: Diagnosis not present

## 2022-06-27 ENCOUNTER — Telehealth: Payer: Self-pay | Admitting: Cardiology

## 2022-06-27 NOTE — Telephone Encounter (Signed)
Patient called and wanted to get results to her CT Scan that was done in January or February.

## 2022-06-27 NOTE — Telephone Encounter (Signed)
Patient is aware of Ct results and scheduled follow up for May per [facility].

## 2022-07-24 DIAGNOSIS — H02886 Meibomian gland dysfunction of left eye, unspecified eyelid: Secondary | ICD-10-CM | POA: Diagnosis not present

## 2022-07-24 DIAGNOSIS — H04123 Dry eye syndrome of bilateral lacrimal glands: Secondary | ICD-10-CM | POA: Diagnosis not present

## 2022-07-24 DIAGNOSIS — H02883 Meibomian gland dysfunction of right eye, unspecified eyelid: Secondary | ICD-10-CM | POA: Diagnosis not present

## 2022-07-24 DIAGNOSIS — H47321 Drusen of optic disc, right eye: Secondary | ICD-10-CM | POA: Diagnosis not present

## 2022-07-24 DIAGNOSIS — H2511 Age-related nuclear cataract, right eye: Secondary | ICD-10-CM | POA: Diagnosis not present

## 2022-07-26 ENCOUNTER — Telehealth: Payer: Self-pay | Admitting: *Deleted

## 2022-07-26 NOTE — Patient Outreach (Signed)
  Care Coordination   07/26/2022 Name: Ashley Torres MRN: [id number] DOB: 06-08-1955   Care Coordination Outreach Attempts:  An unsuccessful telephone outreach was attempted today to offer the patient information about available care coordination services.  Follow Up Plan:  Additional outreach attempts will be made to offer the patient care coordination information and services.   Encounter Outcome:  No Answer   Care Coordination Interventions:  No, not indicated    Reece Levy, MSW, LCSW Clinical Social Worker [facility] [phone]

## 2022-08-10 NOTE — Progress Notes (Unsigned)
Cardiology Office Note:    Date:  08/13/2022   ID:  Ashley Torres, DOB [birth year], MRN [id number]  PCP:  Darral Dash, DO   [facility] Providers Cardiologist:  Tobias Alexander, MD {   Referring MD: Darral Dash, DO    History of Present Illness:    Ashley Torres is a 67 y.o. female with a hx of congenital deafness, bicuspid aortic valve status post bioprosthetic aortic valve replacement in 03/2015, coarctation of the aorta status post repair as a child, hypertension, hyperlipidemia, hypothyroidism, chronic lower extremity edema, chronic sinusitis, and GERD who presents to clinic for follow-up.   Per Dr. Lindaann Slough notes, she had coarctation repair around 67 years of age. In 2016 she was found to have severe aortic stenosis. Pre-op cath 02/2015 at [facility] showed normal coronaries. She subsequently underwent aortic valve replacement with annulus enlargement (19 mm Magna model 300 Carpentier-Edwards bovine pericardial heart valve) on 04/17/15 at [facility] TTE 08/2017 showed moderate focal BSH of the septum, EF 60-65%, grade 1 DD, high VFP, s/p AVR with mildly elevated mean gradient of 21 mmHg; no AI; trace TR with mild pulmonary hypertension; elevated velocity in descending aorta (2.7 m/s); suggested CTA or MRA to further assess coarctation. CT scan 09/2017 showed "some expected narrowing of the distal arch and proximal descending thoracic aorta at the level of prior coarctation repair. 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 This can be followed in the future by CTA or MRA. Elevated aortic velocity may be secondary to the underlying smoothly tapered narrowing in combination with significant tortuosity present. The lack of significant collateral arteries may argue against a hemodynamically significant recurrent stenosis at the level of prior repair."  Dr. Delton See felt there was some expected narrowing but not significant and recommended continued surveillance. She was seen in the ED 02/2018 with acute on chronic dizziness with unrevealing workup. She declined MRI at that time. Meclizine resolved her symptoms. She was previously offered furosemide for her chronic edema but opted against this.  Was seen in follow-up by HLD clinic and was ultimately placed on repatha due to statin intolerance and GI upset with zetia.  Last TTE 06/22/21 with LVEF 60-65%, mild LVH, G1DD, normal RV, mild LAE, mean AoV gradient , Vmax 3.23m/s. Turbulence in the prox descending aorta with peak gradient consistent with residual coarctation.  Was last seen in clinic on 12/2021 where she was doing well from a CV standpoint.  Today, the patient overall feels well. No chest pain, orthopnea, or PND. Has SOB with seasonal allergies, but no significant dyspnea. Has LE edema at the end of the day. She exercises regularly without exertional symptoms. She no longer plans to move back to [place].   Past Medical History:  Diagnosis Date   Allergic rhinitis    Anxiety    Aortic stenosis    Aphakia of left eye    Aphakia of left eye 06/29/2014   Bicuspid aortic valve    Blindness of left eye    Cervical prolapse 09/14/2014   Stage 3 cervical prolapse with urge incontinence per Gynecology. 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   Overview:  - congential deafness - congenital blindness in Left Eye - congenital aortic stenosis   Deafness congenital  Drusen of right optic disc    Environmental and seasonal allergies    Gastric stress ulcer    GERD (gastroesophageal reflux disease) 1990   H/O aortic coarctation repair    H/O aortic valve replacement 01/13/2016   Congenital biscuspid aortic valve s/p replacement 03/2015 and coarctation of aorta s/p repair in  childhood   H/O coarctation of aorta 01/13/2016   HLD (hyperlipidemia) 07/17/2013   Overview:  - Lipid at goal (ASCVD 10-yr risk 3.1%) on Simvastatin 20 mg , d/c statin in 2016 for myalgias. Started red rice yeast. Statin restarted in hospital and now patient off all meds for this  Last Assessment & Plan:  Remains at goal    HTN (hypertension) 01/13/2016   Overview:  - Goal SBP <140 - Lisinopril 10 mg p.o. (07/20/19 decreased to 10 mg from 20 mg for dizziness symptoms).  -    Hyperlipidemia 1997   Hypertension 1997   Hypothyroidism    Hypothyroidism (acquired) 07/17/2013   Overview:  - secondary to iatrogenic thyroidectomy for benign nodule - levothyroxine 88 mg/day   Nuclear sclerosis of right eye    Obesity    Obesity, Class II, BMI 35-39.9 07/17/2013   Overview:  - weight stable with diet and exercise - consider bariatric surgery given associated HTN   Rubella syndrome, congenital    S/P AVR (aortic valve replacement)    Scoliosis    Uterine prolapse    Ventral hernia without obstruction or gangrene 07/21/2019   Vision impairment    Vision loss, left eye     Past Surgical History:  Procedure Laterality Date   AORTIC VALVE REPLACEMENT  2017   breast biopsies     2001, 2002, & 2005    BREAST EXCISIONAL BIOPSY Left    CARDIAC CATHETERIZATION     COARCTATION OF AORTA REPAIR     repaired at age 58   EYE SURGERY  1964   blindness caused by german measles   RADIOLOGY WITH ANESTHESIA N/A 02/07/2021   Procedure: MRI WITH ANESTHESIA- BRAIN WITH /WITHOUT  CONSTRAST ORBITS WITH / WITHOUT CONSTRAST;  Surgeon: Radiologist, Medication, MD;  Location: [facility] OR;  Service: Radiology;  Laterality: N/A;   THYROID SURGERY  1997   benign mass    Current Medications: Current Meds  Medication Sig   acetaminophen (TYLENOL) 500 MG tablet Take 500 mg by mouth daily as needed for moderate pain.    albuterol (VENTOLIN HFA) 108 (90 Base) MCG/ACT inhaler Inhale 2 puffs into the lungs every 4 (four) hours as  needed for wheezing or shortness of breath.   ALPHAGAN P 0.1 % SOLN Place 1 drop into the right eye 2 (two) times daily.   amoxicillin (AMOXIL) 500 MG tablet TAKE 4 TABLETS BY MOUTH 30 TO 60 MINUTES PRIOR TO DENTAL PROCEDURES   aspirin EC 81 MG tablet Take 81 mg by mouth daily.   Calcium Carb-Cholecalciferol [phone] MG-UNIT CAPS Take 1 tablet by mouth daily.   calcium carbonate (TUMS - DOSED IN MG ELEMENTAL CALCIUM) 500 MG chewable tablet Chew 1 tablet by mouth daily as needed for indigestion.   chlorpheniramine (CHLOR-TRIMETON) 4 MG tablet Take 4 mg by mouth daily as needed for allergies.   Evolocumab (REPATHA SURECLICK) 140 MG/ML SOAJ Inject 140 mg into the skin every 14 (fourteen) days.   famotidine (PEPCID) 10 MG tablet Take 1 tablet (10 mg total) by mouth daily.   fexofenadine (ALLEGRA) 180 MG tablet Take 180 mg by mouth daily as needed for allergies.  FOLIC ACID PO Take 1 tablet by mouth daily.   GLYCERIN-HYPROMELLOSE-PEG 400 OP Place 1 drop into both eyes daily as needed (Dry eyes).   hydrochlorothiazide (HYDRODIURIL) 25 MG tablet Take 1 tablet daily by mouth   levothyroxine (SYNTHROID) 100 MCG tablet Take 1 tablet (100 mcg total) by mouth daily before breakfast.   lisinopril (ZESTRIL) 10 MG tablet TAKE 1 TABLET(10 MG) BY MOUTH DAILY   meclizine (ANTIVERT) 25 MG tablet Take 1 tablet (25 mg total) by mouth 3 (three) times daily as needed for dizziness.   Multiple Vitamins-Minerals (OCUVITE PRESERVISION PO) Take 1 tablet by mouth daily.   vitamin C (ASCORBIC ACID) 250 MG tablet Take 250 mg by mouth daily.   XIIDRA 5 % SOLN Apply 1 drop to eye 2 (two) times daily.     Allergies:   Statins, Atorvastatin, Erythromycin, Furosemide, Hydrocodone-acetaminophen, Meloxicam, Metoprolol, Montelukast, Omeprazole, Potassium chloride, and Rosuvastatin   Social History   Socioeconomic History   Marital status: Single    Spouse name: Not on file   Number of children: Not on file   Years of  education: some college   Highest education level: Not on file  Occupational History    Comment: unemployed  Tobacco Use   Smoking status: Never    Passive exposure: Never   Smokeless tobacco: Never  Vaping Use   Vaping Use: Never used  Substance and Sexual Activity   Alcohol use: Never   Drug use: Never   Sexual activity: Never  Other Topics Concern   Not on file  Social History Narrative   Lives alone   Right handed   Caffeine: chocolate only, not often   Social Determinants of Corporate investment banker Strain: Not on file  Food Insecurity: Not on file  Transportation Needs: Not on file  Physical Activity: Not on file  Stress: Not on file  Social Connections: Not on file     Family History: The patient's family history includes Arthritis in her brother and father; Asthma in her mother; Breast cancer in an other family member; Diabetes in her paternal grandmother; GER disease in her father; Heart murmur in her father; Hyperlipidemia in her father; Other in her mother; Rheumatic fever in her paternal grandmother; Sinusitis in her brother. There is no history of Heart disease.  ROS:   As per HPI   EKGs/Labs/Other Studies Reviewed:    The following studies were reviewed today:  Cardiac Studies & Procedures       ECHOCARDIOGRAM  ECHOCARDIOGRAM COMPLETE 06/22/2021  Narrative ECHOCARDIOGRAM REPORT    Patient Name:   SHONTE BRIGNAC Date of Exam: 06/22/2021 Medical Rec #:  [id number]      Height:       57.5 in Accession #:    [id number]     Weight:       163.0 lb Date of Birth:  05/26/1955      BSA:          1.660 m Patient Age:    65 years       BP:           122/80 mmHg Patient Gender: F              HR:           90 bpm. Exam Location:  [address]  Procedure: 2D Echo, Cardiac Doppler and Color Doppler  Indications:    Z95.2 AVR  History:        Patient has prior history of  Echocardiogram examinations, most recent 04/26/2020. AVR (19mm Carpentier  bioprosthesis), Signs/Symptoms:H/o coarctation of the aorta; Risk Factors:Hypertension and Dyslipidemia.  Sonographer:    Samule Ohm RDCS Referring Phys: [id number] LAURA R INGOLD  IMPRESSIONS   1. Left ventricular ejection fraction, by estimation, is 60 to 65%. The left ventricle has normal function. The left ventricle has no regional wall motion abnormalities. There is mild left ventricular hypertrophy of the basal-septal segment. Left ventricular diastolic parameters are consistent with Grade I diastolic dysfunction (impaired relaxation). 2. Right ventricular systolic function is normal. The right ventricular size is normal. 3. Left atrial size was mildly dilated. 4. The mitral valve is normal in structure. No evidence of mitral valve regurgitation. No evidence of mitral stenosis. 5. Aortic valve gradients are moderately increased but not significantly changed since 2022. The aortic valve has been repaired/replaced. Aortic valve regurgitation is not visualized. Moderate aortic valve stenosis. Aortic valve area, by VTI measures 0.79 cm. Aortic valve mean gradient measures 24.0 mmHg. Aortic valve Vmax measures 3.26 m/s. 6. The inferior vena cava is normal in size with greater than 50% respiratory variability, suggesting right atrial pressure of 3 mmHg. 7. There is turbulence in proximal descending aorta. Peak gradient . Consistent with residual coarctation stenosis.  FINDINGS Left Ventricle: Left ventricular ejection fraction, by estimation, is 60 to 65%. The left ventricle has normal function. The left ventricle has no regional wall motion abnormalities. The left ventricular internal cavity size was normal in size. There is mild left ventricular hypertrophy of the basal-septal segment. Left ventricular diastolic parameters are consistent with Grade I diastolic dysfunction (impaired relaxation).  Right Ventricle: The right ventricular size is normal. No increase in right ventricular  wall thickness. Right ventricular systolic function is normal.  Left Atrium: Left atrial size was mildly dilated.  Right Atrium: Right atrial size was normal in size.  Pericardium: There is no evidence of pericardial effusion.  Mitral Valve: The mitral valve is normal in structure. No evidence of mitral valve regurgitation. No evidence of mitral valve stenosis.  Tricuspid Valve: The tricuspid valve is normal in structure. Tricuspid valve regurgitation is trivial. No evidence of tricuspid stenosis.  Aortic Valve: Aortic valve gradients are moderately increased but not significantly changed since 2022. The aortic valve has been repaired/replaced. Aortic valve regurgitation is not visualized. Moderate aortic stenosis is present. Aortic valve mean gradient measures 24.0 mmHg. Aortic valve peak gradient measures 42.4 mmHg. Aortic valve area, by VTI measures 0.79 cm.  Pulmonic Valve: The pulmonic valve was normal in structure. Pulmonic valve regurgitation is mild to moderate. No evidence of pulmonic stenosis.  Aorta: The aortic root is normal in size and structure.  Venous: The inferior vena cava is normal in size with greater than 50% respiratory variability, suggesting right atrial pressure of 3 mmHg.  IAS/Shunts: No atrial level shunt detected by color flow Doppler.   LEFT VENTRICLE PLAX 2D LVIDd:         3.20 cm   Diastology LVIDs:         2.30 cm   LV e' medial:    7.18 cm/s LV PW:         1.10 cm   LV E/e' medial:  13.0 LV IVS:        1.10 cm   LV e' lateral:   7.07 cm/s LVOT diam:     1.70 cm   LV E/e' lateral: 13.2 LV SV:         53 LV SV Index:   32  LVOT Area:     2.27 cm   RIGHT VENTRICLE            IVC RVSP:           32.6 mmHg  IVC diam: 1.60 cm  LEFT ATRIUM             Index        RIGHT ATRIUM           Index LA diam:        3.70 cm 2.23 cm/m   RA Pressure: 3.00 mmHg LA Vol (A2C):   49.7 ml 29.95 ml/m  RA Area:     13.00 cm LA Vol (A4C):   47.5 ml 28.62 ml/m   RA Volume:   31.80 ml  19.16 ml/m LA Biplane Vol: 49.0 ml 29.53 ml/m AORTIC VALVE AV Area (Vmax):    0.74 cm AV Area (Vmean):   0.77 cm AV Area (VTI):     0.79 cm AV Vmax:           325.50 cm/s AV Vmean:          227.000 cm/s AV VTI:            0.670 m AV Peak Grad:      42.4 mmHg AV Mean Grad:      24.0 mmHg LVOT Vmax:         106.00 cm/s LVOT Vmean:        76.600 cm/s LVOT VTI:          0.233 m LVOT/AV VTI ratio: 0.35  AORTA Ao Root diam: 3.00 cm Ao Asc diam:  3.30 cm  MITRAL VALVE                TRICUSPID VALVE MV Area (PHT): 2.60 cm     TR Peak grad:   29.6 mmHg MV Decel Time: 292 msec     TR Vmax:        272.00 cm/s MV E velocity: 93.30 cm/s   Estimated RAP:  3.00 mmHg MV A velocity: 138.00 cm/s  RVSP:           32.6 mmHg MV E/A ratio:  0.68 SHUNTS Systemic VTI:  0.23 m Systemic Diam: 1.70 cm  Arvilla Meres MD Electronically signed by Arvilla Meres MD Signature Date/Time: 06/22/2021/12:04:40 PM    Final              EKG:  NSR, RBBB, LAFB, HR 72   Recent Labs: No results found for requested labs within last 365 days.  Recent Lipid Panel    Component Value Date/Time   CHOL 138 07/24/2021 1054   TRIG 170 (H) 07/24/2021 1054   HDL 49 07/24/2021 1054   CHOLHDL 2.8 07/24/2021 1054   CHOLHDL 2.8 02/06/2021 0334   VLDL 40 02/06/2021 0334   LDLCALC 61 07/24/2021 1054   LDLDIRECT 135 (H) 12/13/2016 1440     Risk Assessment/Calculations:                Physical Exam:    VS:  BP 129/74   Pulse 72   Ht 4' 9.5" (1.461 m)   Wt 165 lb 3.2 oz (74.9 kg)   LMP  (LMP Unknown)   SpO2 95%   BMI 35.13 kg/m     Wt Readings from Last 3 Encounters:  08/13/22 165 lb 3.2 oz (74.9 kg)  01/03/22 166 lb 9.6 oz (75.6 kg)  12/28/21 166 lb (75.3 kg)     GEN: Comfortable,  congenitally deaf HEENT: Normal NECK: No JVD; No carotid bruits CARDIAC: RRR, 3/6 early peaking systolic murmur RESPIRATORY:  Clear to auscultation without rales, wheezing or  rhonchi  ABDOMEN: Soft, non-tender, non-distended MUSCULOSKELETAL:  Trace-1+ LE edema, warm SKIN: Warm and dry NEUROLOGIC:  Alert and oriented x 3 PSYCHIATRIC:  Normal affect   ASSESSMENT:    1. Bicuspid aortic valve   2. H/O coarctation of aorta   3. Essential hypertension   4. S/P AVR (aortic valve replacement)   5. Hyperlipidemia, unspecified hyperlipidemia type   6. Chronic diastolic heart failure (HCC)   7. Hypothyroidism (acquired)    PLAN:    In order of problems listed above:  #History of Bicuspid Aortic Valve S/p AVR in 2017: Underwent aortic valve replacement with annulus enlargement (19 mm Magna model 300 Carpentier-Edwards bovine pericardial heart valve) on 04/17/15 at [facility]. Pre-op cath without significant disease. TTEs with elevated gradients but have been relatively stable with last mean gradient . Currently, doing well with no chest pain, SOB, lightheadedness or dizziness.  -Continue yearly echoes for monitoring -Continue SBE ppx -Continue ASA 81mg  daily  #Coarctation of the Aorta: S/p repair at age 69 now with residual coarctation with mean gradient . Recommend serial monitoring with CTA/MRA. Needs aggressive BP control as below.  -Last CTA 03/2022 with no evidence of aneurysm, dissection, or re-coarctation -Prefers CTA every 2 years -BP control as below  #Chronic Diastolic HF: TTE with LVEF 60-65%, G1DD. Has chronic LE edema which is fairly well controlled with HCTZ.  -Continue HCTZ 25mg  daily (has allergy to lasix) -Continue lisinopril 10mg  daily -Low Na diet  #HTN: Elevated initially but improved to 120s/70s on recheck. -Continue lisinopril 10mg  daily -Continue HCTZ 25mg  daily  #HLD: -Continue praluent 75mg  q 2 weeks -Last LDL controlled at 61 -Repeat lipids today  #RBBB: #LAFB: -No concerning symptoms -Will monitor         Follow-up: 17-months    Medication Adjustments/Labs and Tests  Ordered: Current medicines are reviewed at length with the patient today.  Concerns regarding medicines are outlined above.  No orders of the defined types were placed in this encounter.  No orders of the defined types were placed in this encounter.   There are no Patient Instructions on file for this visit.     Signed, Meriam Sprague, MD  08/13/2022 11:16 AM    [facility]

## 2022-08-13 ENCOUNTER — Ambulatory Visit: Payer: Medicare HMO | Attending: Cardiology | Admitting: Cardiology

## 2022-08-13 ENCOUNTER — Encounter: Payer: Self-pay | Admitting: Cardiology

## 2022-08-13 VITALS — BP 129/74 | HR 72 | Ht <= 58 in | Wt 165.2 lb

## 2022-08-13 DIAGNOSIS — E039 Hypothyroidism, unspecified: Secondary | ICD-10-CM | POA: Diagnosis not present

## 2022-08-13 DIAGNOSIS — Z952 Presence of prosthetic heart valve: Secondary | ICD-10-CM

## 2022-08-13 DIAGNOSIS — I10 Essential (primary) hypertension: Secondary | ICD-10-CM | POA: Diagnosis not present

## 2022-08-13 DIAGNOSIS — Q231 Congenital insufficiency of aortic valve: Secondary | ICD-10-CM | POA: Diagnosis not present

## 2022-08-13 DIAGNOSIS — Z8774 Personal history of (corrected) congenital malformations of heart and circulatory system: Secondary | ICD-10-CM

## 2022-08-13 DIAGNOSIS — E785 Hyperlipidemia, unspecified: Secondary | ICD-10-CM | POA: Diagnosis not present

## 2022-08-13 DIAGNOSIS — I5032 Chronic diastolic (congestive) heart failure: Secondary | ICD-10-CM | POA: Diagnosis not present

## 2022-08-13 MED ORDER — LISINOPRIL 10 MG PO TABS
ORAL_TABLET | ORAL | 3 refills | Status: DC
Start: 2022-08-13 — End: 2023-08-21

## 2022-08-13 MED ORDER — AMOXICILLIN 500 MG PO TABS
ORAL_TABLET | ORAL | 4 refills | Status: DC
Start: 2022-08-13 — End: 2022-11-06

## 2022-08-13 MED ORDER — ALBUTEROL SULFATE HFA 108 (90 BASE) MCG/ACT IN AERS
2.0000 | INHALATION_SPRAY | RESPIRATORY_TRACT | 3 refills | Status: DC | PRN
Start: 2022-08-13 — End: 2022-10-23

## 2022-08-13 MED ORDER — LEVOTHYROXINE SODIUM 100 MCG PO TABS
100.0000 ug | ORAL_TABLET | Freq: Every day | ORAL | 3 refills | Status: AC
Start: 2022-08-13 — End: ?

## 2022-08-13 MED ORDER — HYDROCHLOROTHIAZIDE 25 MG PO TABS
ORAL_TABLET | ORAL | 3 refills | Status: DC
Start: 2022-08-13 — End: 2023-05-23

## 2022-08-13 NOTE — Patient Instructions (Signed)
Medication Instructions:   Your physician recommends that you continue on your current medications as directed. Please refer to the Current Medication list given to you today.  *If you need a refill on your cardiac medications before your next appointment, please call your pharmacy*   Lab Work:  TODAY--LIPIDS, CMET, TSH, AND CBC  If you have labs (blood work) drawn today and your tests are completely normal, you will receive your results only by: MyChart Message (if you have MyChart) OR A paper copy in the mail If you have any lab test that is abnormal or we need to change your treatment, we will call you to review the results.   Testing/Procedures:  Your physician has requested that you have an echocardiogram. Echocardiography is a painless test that uses sound waves to create images of your heart. It provides your doctor with information about the size and shape of your heart and how well your heart's chambers and valves are working. This procedure takes approximately one hour. There are no restrictions for this procedure. Please do NOT wear cologne, perfume, aftershave, or lotions (deodorant is allowed). Please arrive 15 minutes prior to your appointment time.    Follow-Up: At [facility], you and your health needs are our priority.  As part of our continuing mission to provide you with exceptional heart care, we have created designated Provider Care Teams.  These Care Teams include your primary Cardiologist (physician) and Advanced Practice Providers (APPs -  Physician Assistants and Nurse Practitioners) who all work together to provide you with the care you need, when you need it.  We recommend signing up for the patient portal called "MyChart".  Sign up information is provided on this After Visit Summary.  MyChart is used to connect with patients for Virtual Visits (Telemedicine).  Patients are able to view lab/test results, encounter notes, upcoming appointments, etc.   Non-urgent messages can be sent to your provider as well.   To learn more about what you can do with MyChart, go to [email or web address].    Your next appointment:   1 year(s)  Provider:   Jodelle Red, MD

## 2022-08-14 LAB — CBC
Hematocrit: 40.4 % (ref 34.0–46.6)
Hemoglobin: 13.7 g/dL (ref 11.1–15.9)
MCH: 29.8 pg (ref 26.6–33.0)
MCHC: 33.9 g/dL (ref 31.5–35.7)
MCV: 88 fL (ref 79–97)
Platelets: 245 10*3/uL (ref 150–450)
RBC: 4.6 x10E6/uL (ref 3.77–5.28)
RDW: 13.1 % (ref 11.7–15.4)
WBC: 5.4 10*3/uL (ref 3.4–10.8)

## 2022-08-14 LAB — COMPREHENSIVE METABOLIC PANEL
ALT: 24 IU/L (ref 0–32)
AST: 23 IU/L (ref 0–40)
Albumin/Globulin Ratio: 1.8 (ref 1.2–2.2)
Albumin: 4.4 g/dL (ref 3.9–4.9)
Alkaline Phosphatase: 96 IU/L (ref 44–121)
BUN/Creatinine Ratio: 30 — ABNORMAL HIGH (ref 12–28)
BUN: 23 mg/dL (ref 8–27)
Bilirubin Total: 0.4 mg/dL (ref 0.0–1.2)
CO2: 26 mmol/L (ref 20–29)
Calcium: 10.9 mg/dL — ABNORMAL HIGH (ref 8.7–10.3)
Chloride: 98 mmol/L (ref 96–106)
Creatinine, Ser: 0.77 mg/dL (ref 0.57–1.00)
Globulin, Total: 2.5 g/dL (ref 1.5–4.5)
Glucose: 81 mg/dL (ref 70–99)
Potassium: 4.5 mmol/L (ref 3.5–5.2)
Sodium: 140 mmol/L (ref 134–144)
Total Protein: 6.9 g/dL (ref 6.0–8.5)
eGFR: 86 mL/min/{1.73_m2} (ref >59)

## 2022-08-14 LAB — LIPID PANEL
Chol/HDL Ratio: 2.9 ratio (ref 0.0–4.4)
Cholesterol, Total: 152 mg/dL (ref 100–199)
HDL: 53 mg/dL (ref >39)
LDL Chol Calc (NIH): 73 mg/dL (ref 0–99)
Triglycerides: 154 mg/dL — ABNORMAL HIGH (ref 0–149)
VLDL Cholesterol Cal: 26 mg/dL (ref 5–40)

## 2022-08-14 LAB — TSH: TSH: 1.08 u[IU]/mL (ref 0.450–4.500)

## 2022-08-19 ENCOUNTER — Encounter: Payer: Self-pay | Admitting: Student

## 2022-08-19 ENCOUNTER — Other Ambulatory Visit (HOSPITAL_COMMUNITY)
Admission: RE | Admit: 2022-08-19 | Discharge: 2022-08-19 | Disposition: A | Discharge status: Home / Self Care | Payer: Medicare HMO | Source: Ambulatory Visit | Attending: Family Medicine | Admitting: Family Medicine

## 2022-08-19 ENCOUNTER — Ambulatory Visit (INDEPENDENT_AMBULATORY_CARE_PROVIDER_SITE_OTHER): Payer: Medicare HMO | Admitting: Student

## 2022-08-19 VITALS — HR 85 | Wt 165.0 lb

## 2022-08-19 DIAGNOSIS — Z124 Encounter for screening for malignant neoplasm of cervix: Secondary | ICD-10-CM | POA: Diagnosis not present

## 2022-08-19 DIAGNOSIS — Z131 Encounter for screening for diabetes mellitus: Secondary | ICD-10-CM | POA: Diagnosis not present

## 2022-08-19 DIAGNOSIS — Z0000 Encounter for general adult medical examination without abnormal findings: Secondary | ICD-10-CM | POA: Diagnosis not present

## 2022-08-19 DIAGNOSIS — Z01419 Encounter for gynecological examination (general) (routine) without abnormal findings: Secondary | ICD-10-CM | POA: Insufficient documentation

## 2022-08-19 DIAGNOSIS — Z1151 Encounter for screening for human papillomavirus (HPV): Secondary | ICD-10-CM | POA: Diagnosis not present

## 2022-08-19 DIAGNOSIS — L821 Other seborrheic keratosis: Secondary | ICD-10-CM | POA: Insufficient documentation

## 2022-08-19 LAB — POCT GLYCOSYLATED HEMOGLOBIN (HGB A1C): Hemoglobin A1C: 5.4 % (ref 4.0–5.6)

## 2022-08-19 NOTE — Assessment & Plan Note (Addendum)
-  1 x 2 cm seborrheic keratosis noted in the left anterior hip/groin region. Bothersome to patient as it snags on clothing.  -Patient to schedule appointment with Derm clinic for cryotherapy for removal

## 2022-08-19 NOTE — Assessment & Plan Note (Addendum)
Cytology pap performed today. No STI screening. Cervical prolapse noted on exam, fairly significant.  Did encourage follow-up with gynecology for this.

## 2022-08-19 NOTE — Progress Notes (Signed)
SUBJECTIVE:   CHIEF COMPLAINT / HPI:   Ashley Torres is a 67 year-old female here for annual exam/ Pap smear. ASL in person interpreter utilized for entirety of visit  Denies alcohol, tobacco, ilicit substance use.  Exercises 4-6 hours a week at gym using treadmill, weights, leg press.  Denies sexual activity. Concerned about a small bump on her groin Does have a history of stage 3 cervical prolapse with urge incontinence.  Patient is also noticed a bump on her left hip area that is bothersome as it snagged on clothing.  It has been there for some time.  PERTINENT  PMH / PSH:   Past Medical History:  Diagnosis Date   Allergic rhinitis    Anxiety    Aortic stenosis    Aphakia of left eye    Aphakia of left eye 06/29/2014   Bicuspid aortic valve    Blindness of left eye    Cervical prolapse 09/14/2014   Stage 3 cervical prolapse with urge incontinence per Gynecology. 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   Overview:  - congential deafness - congenital blindness in Left Eye - congenital aortic stenosis   Deafness congenital    Drusen of right optic disc    Environmental and seasonal allergies    Gastric stress ulcer    GERD (gastroesophageal reflux disease) 1990   H/O aortic coarctation repair    H/O aortic valve replacement 01/13/2016   Congenital biscuspid aortic valve s/p replacement 03/2015 and coarctation of aorta s/p repair in childhood   H/O coarctation of aorta 01/13/2016   HLD (hyperlipidemia) 07/17/2013   Overview:  - Lipid at goal (ASCVD 10-yr risk 3.1%) on Simvastatin 20 mg , d/c statin in 2016 for myalgias. Started red rice yeast. Statin restarted in hospital and now patient off all meds for this  Last Assessment & Plan:  Remains at goal    HTN (hypertension) 01/13/2016   Overview:  - Goal SBP <140 - Lisinopril 10 mg p.o. (07/20/19 decreased to 10 mg from 20 mg for  dizziness symptoms).  -    Hyperlipidemia 1997   Hypertension 1997   Hypothyroidism    Hypothyroidism (acquired) 07/17/2013   Overview:  - secondary to iatrogenic thyroidectomy for benign nodule - levothyroxine 88 mg/day   Nuclear sclerosis of right eye    Obesity    Obesity, Class II, BMI 35-39.9 07/17/2013   Overview:  - weight stable with diet and exercise - consider bariatric surgery given associated HTN   Rubella syndrome, congenital    S/P AVR (aortic valve replacement)    Scoliosis    Uterine prolapse    Ventral hernia without obstruction or gangrene 07/21/2019   Vision impairment    Vision loss, left eye     Patient Care Team: Darral Dash, DO as PCP - General (Family Medicine) Lars Masson, MD as PCP - Cardiology (Cardiology) OBJECTIVE:  Pulse 85   Wt 165 lb (74.8 kg)   LMP  (LMP Unknown)   SpO2 96%   BMI 35.09 kg/m  Physical Exam Constitutional:      Appearance: Normal appearance.  Eyes:     Comments: Wears corrective lenses  Cardiovascular:     Rate and Rhythm: Normal rate and regular rhythm.  Pulmonary:     Effort: Pulmonary effort is normal.     Breath sounds: Normal breath sounds.  Genitourinary:  General: Normal vulva.     Vagina: No vaginal discharge.     Comments: Chaperone present for exam, Cordelia Pen CMA.  Cervical prolapse noted on exam. Skin:    General: Skin is warm and dry.     Comments: 1 to 2 cm seborrheic keratosis on left anterior hip/groin region   Neurological:     Mental Status: She is alert.      ASSESSMENT/PLAN:  Diabetes mellitus screening -     POCT glycosylated hemoglobin (Hb A1C) -     Cytology - PAP  Healthcare maintenance Assessment & Plan: -Up-to-date on colonoscopy, last done in 2016.  On 10-year plan. -Up-to-date on cholesterol screening. -Received shingles, pneumonia, flu, COVID vaccines last year.   Papanicolaou smear Assessment & Plan: Cytology pap performed today. No STI screening. Cervical prolapse  noted on exam, fairly significant.  Did encourage follow-up with gynecology for this.    Seborrheic keratosis Assessment & Plan: -1 x 2 cm seborrheic keratosis noted in the left anterior hip/groin region. Bothersome to patient as it snags on clothing.  -Patient to schedule appointment with Derm clinic for cryotherapy for removal    No follow-ups on file. Darral Dash, DO 08/19/2022, 11:22 AM PGY-2, [facility]

## 2022-08-19 NOTE — Progress Notes (Signed)
P 

## 2022-08-19 NOTE — Assessment & Plan Note (Signed)
-  Up-to-date on colonoscopy, last done in 2016.  On 10-year plan. -Up-to-date on cholesterol screening. -Received shingles, pneumonia, flu, COVID vaccines last year.

## 2022-08-19 NOTE — Patient Instructions (Signed)
It was great to see you! Thank you for allowing me to participate in your care!  Our plans for today:  - Continue taking your medications as prescribed - I will let you know your pap smear results - Make an appointment with our Dermatology clinic to get your lesion removed. It is benign and called a "seborrheic keratosis"  Take care and seek immediate care sooner if you develop any concerns.   Dr. Darral Dash, DO [facility]

## 2022-08-21 ENCOUNTER — Encounter: Payer: Self-pay | Admitting: Student

## 2022-08-21 LAB — CYTOLOGY - PAP
Comment: NEGATIVE
Diagnosis: NEGATIVE
High risk HPV: NEGATIVE

## 2022-08-29 ENCOUNTER — Ambulatory Visit (INDEPENDENT_AMBULATORY_CARE_PROVIDER_SITE_OTHER): Payer: Medicare HMO | Admitting: Family Medicine

## 2022-08-29 DIAGNOSIS — L821 Other seborrheic keratosis: Secondary | ICD-10-CM | POA: Diagnosis not present

## 2022-08-29 DIAGNOSIS — D229 Melanocytic nevi, unspecified: Secondary | ICD-10-CM | POA: Diagnosis not present

## 2022-08-29 NOTE — Progress Notes (Signed)
    SUBJECTIVE:   CHIEF COMPLAINT / HPI: Interpreter present  Groin mole: Here for removal consultation. The mole of her left groin has been present for over 3 years. Has not increased in size or changed color lately. She denies any symptoms. Would like to get it off and r/o cancer.   PERTINENT  PMH / PSH: PMHx reviewed  OBJECTIVE:   LMP  (LMP Unknown)   Physical Exam Vitals reviewed.  Pulmonary:     Effort: Pulmonary effort is normal. No respiratory distress.  Skin:         Comments: Round, raised, sessile, hyperpigmented, stuck-on lesion on her left inguinal fold: no surrounding erythema and no tenderness. Size is about 3cm by 3 cm circumferentially.   Psychiatric:        Mood and Affect: Mood normal.      ASSESSMENT/PLAN:   Seborrheic keratosis Large seborrheic keratosis Patient reassured this is likely benign. However, she wants it off and sent to pathology. Informed consent obtained for the procedure.   Excision of Benign Skin Lesion Procedure Note PRE-OP DIAGNOSIS: Large seborrheic keratosis  POST-OP DIAGNOSIS: Same   PROCEDURE: skin lesion excision  Performing Physician: Janit Pagan, MD, MPH  PROCEDURE:  Excisional biopsy  The area surrounding the skin lesion was prepared and draped in the  usual sterile manner. Anesthesia attained by local infiltration of 1% lidocaine without epi. The lesion was removed in the usual manner by the  biopsy method noted above. Hemostasis was assured.  Closure:   Both deep and superficial suturing Absorbable sutures 4-0 used for deep suturing 6 stiches of non-absorbable suture placed for surface suturing.   Followup: The patient tolerated the procedure well without  complications.  Standard post-procedure care is explained and return  precautions are given.  F/U in 14 days for suture removal.     Janit Pagan, MD [facility]

## 2022-08-29 NOTE — Patient Instructions (Signed)
Sutured Wound Care Sutures are stitches that can be used to close wounds. Some stitches break down as they heal (absorbable). Other stitches need to be taken out by your doctor (nonabsorbable). Taking good care of your wound can help to prevent pain and infection. It can also help your wound heal more quickly. Follow instructions from your doctor about how to care for your sutured wound. Supplies needed: Soap and water. A clean, dry towel. Solution to clean your wound, if needed. A clean gauze or bandage (dressing), if needed. Antibiotic ointment, if told by your doctor. How to care for your sutured wound  Keep the wound fully dry for the first 24 hours or as long as told by your doctor. After 24-48 hours, you may shower or bathe as told by your doctor. Do not soak the wound or put the wound under water until the stitches have been taken out. After the first 24 hours, clean the wound once a day, or as often as your doctor tells you to. Take these steps: Wash and rinse the wound as told by your health care provider. Pat the wound dry with a clean towel. Do not rub the wound. After cleaning the wound, put a thin layer of antibiotic ointment on the wound as told by your doctor. This will help: Prevent infection. Keep the bandage from sticking to the wound. Follow instructions from your doctor about how to change your bandage. Make sure you: Wash your hands with soap and water for at least 20 seconds. If you cannot use soap and water, use hand sanitizer. Change your bandage at least once a day, or as often as told by your doctor. If your dressing gets wet or dirty, change it. Leavestitches in place for at least 2 weeks. If you have skin glue over your stitches, this should also stay in place for at least 2 weeks. Leave tape strips alone (if you have them) unless you are told to take them off. You may trim the edges of the tape strips if they curl up. Check your wound every day for signs of  infection. Watch for: Redness, swelling, or pain. Fluid or blood. New warmth, a rash, or hardness at the wound site. Pus or a bad smell. Have the stitches taken out as told by your doctor. Follow these instructions at home: Medicines Take or apply over-the-counter and prescription medicines only as told by your doctor. If you were prescribed an antibiotic medicine or ointment, take or apply it as told by your doctor. Do not stop using the antibiotic even if you start to feel better. General instructions Cover your wound with clothes or put sunscreen on when you are outside. Use a sunscreen of at least 30 SPF. Do not scratch or pick at your wound. Avoid stretching your wound. Raise the injured area above the level of your heart while you are sitting or lying down, if possible. Eat a diet that includes protein, vitamin A, and vitamin C. Doing this will help your wound heal. Drink enough fluid to keep your pee (urine) pale yellow. Keep all follow-up visits. Contact a doctor if: You were given a tetanus shot and you have any of the following at the site where the needle went in: Swelling. Very bad pain. Redness. Bleeding. Your wound breaks open. You see something coming out of your wound, such as wood or glass. You have any of these signs of infection in or around your wound: Redness, swelling, or pain. Fluid or blood.  Warmth. A new rash. Your wound feels hard. You have a fever. The skin near your wound changes color. You have pain that does not get better with medicine. You get numbness around the wound. Get help right away if: You have very bad swelling or more pain around your wound. You have pus or a bad smell coming from your wound. You have painful lumps near your wound or anywhere on your body. You have a red streak going away from your wound. The wound is on your hand or foot, and: Your fingers or toes look pale or blue. You cannot move a finger or toe as you used to  do. You have numbness that spreads down your hand, foot, fingers, or toes. Summary Sutures are stitches that are used to close wounds. Taking good care of your wound can help to prevent pain and infection. Keep the wound fully dry for the first 24 hours or for as long as told by your doctor. After 24-48 hours, you may shower or bathe as told by your doctor. This information is not intended to replace advice given to you by your health care provider. Make sure you discuss any questions you have with your health care provider. Document Revised: 07/10/2020 Document Reviewed: 07/10/2020 Elsevier Patient Education  2024 [employer].

## 2022-08-29 NOTE — Assessment & Plan Note (Signed)
Large seborrheic keratosis Patient reassured this is likely benign. However, she wants it off and sent to pathology. Informed consent obtained for the procedure.   Excision of Benign Skin Lesion Procedure Note PRE-OP DIAGNOSIS: Large seborrheic keratosis  POST-OP DIAGNOSIS: Same   PROCEDURE: skin lesion excision  Performing Physician: Janit Pagan, MD, MPH  PROCEDURE:  Excisional biopsy  The area surrounding the skin lesion was prepared and draped in the  usual sterile manner. Anesthesia attained by local infiltration of 1% lidocaine without epi. The lesion was removed in the usual manner by the  biopsy method noted above. Hemostasis was assured.  Closure:   Both deep and superficial suturing Absorbable sutures 4-0 used for deep suturing 6 stiches of non-absorbable suture placed for surface suturing.   Followup: The patient tolerated the procedure well without  complications.  Standard post-procedure care is explained and return  precautions are given.  F/U in 14 days for suture removal.

## 2022-08-30 ENCOUNTER — Ambulatory Visit (INDEPENDENT_AMBULATORY_CARE_PROVIDER_SITE_OTHER): Payer: Medicare HMO

## 2022-08-30 VITALS — Ht <= 58 in | Wt 165.0 lb

## 2022-08-30 DIAGNOSIS — Z0000 Encounter for general adult medical examination without abnormal findings: Secondary | ICD-10-CM | POA: Diagnosis not present

## 2022-08-30 NOTE — Patient Instructions (Signed)
[name] , Thank you for taking time to come for your Medicare Wellness Visit. I appreciate your ongoing commitment to your health goals. Please review the following plan we discussed and let me know if I can assist you in the future.   These are the goals we discussed:  Goals      Remain active and independent        This is a list of the screening recommended for you and due dates:  Health Maintenance  Topic Date Due   COVID-19 Vaccine (5 - 2023-24 season) 11/16/2021   Flu Shot  10/17/2022   Mammogram  05/29/2023   Medicare Annual Wellness Visit  08/30/2023   Colon Cancer Screening  01/12/2024   Pap Smear  08/18/2025   DTaP/Tdap/Td vaccine (3 - Td or Tdap) 03/03/2030   Hepatitis C Screening  Completed   HIV Screening  Completed   Zoster (Shingles) Vaccine  Completed   HPV Vaccine  Aged Out    Advanced directives: Information on Advanced Care Planning can be found at [facility] Advance Health Care Directives Advance Health Care Directives ([email or web address] Please bring a copy of your health care power of attorney and living will to the office to be added to your chart at your convenience.   Conditions/risks identified: Aim for 30 minutes of exercise or brisk walking, 6-8 glasses of water, and 5 servings of fruits and vegetables each day.  Next appointment: Follow up in one year for your annual wellness visit.   Preventive Care 40-64 Years, Female Preventive care refers to lifestyle choices and visits with your health care provider that can promote health and wellness. What does preventive care include? A yearly physical exam. This is also called an annual well check. Dental exams once or twice a year. Routine eye exams. Ask your health care provider how often you should have your eyes checked. Personal lifestyle choices, including: Daily care of your teeth and gums. Regular physical activity. Eating a healthy diet. Avoiding tobacco and drug  use. Limiting alcohol use. Practicing safe sex. Taking low-dose aspirin daily starting at age 44. Taking vitamin and mineral supplements as recommended by your health care provider. What happens during an annual well check? The services and screenings done by your health care provider during your annual well check will depend on your age, overall health, lifestyle risk factors, and family history of disease. Counseling  Your health care provider may ask you questions about your: Alcohol use. Tobacco use. Drug use. Emotional well-being. Home and relationship well-being. Sexual activity. Eating habits. Work and work Astronomer. Method of birth control. Menstrual cycle. Pregnancy history. Screening  You may have the following tests or measurements: Height, weight, and BMI. Blood pressure. Lipid and cholesterol levels. These may be checked every 5 years, or more frequently if you are over 3 years old. Skin check. Lung cancer screening. You may have this screening every year starting at age 46 if you have a 30-pack-year history of smoking and currently smoke or have quit within the past 15 years. Fecal occult blood test (FOBT) of the stool. You may have this test every year starting at age 72. Flexible sigmoidoscopy or colonoscopy. You may have a sigmoidoscopy every 5 years or a colonoscopy every 10 years starting at age 18. Hepatitis C blood test. Hepatitis B blood test. Sexually transmitted disease (STD) testing. Diabetes screening. This is done by checking your blood sugar (glucose) after you have not eaten for a while (fasting). You  may have this done every 1-3 years. Mammogram. This may be done every 1-2 years. Talk to your health care provider about when you should start having regular mammograms. This may depend on whether you have a family history of breast cancer. BRCA-related cancer screening. This may be done if you have a family history of breast, ovarian, tubal, or  peritoneal cancers. Pelvic exam and Pap test. This may be done every 3 years starting at age 23. Starting at age 57, this may be done every 5 years if you have a Pap test in combination with an HPV test. Bone density scan. This is done to screen for osteoporosis. You may have this scan if you are at high risk for osteoporosis. Discuss your test results, treatment options, and if necessary, the need for more tests with your health care provider. Vaccines  Your health care provider may recommend certain vaccines, such as: Influenza vaccine. This is recommended every year. Tetanus, diphtheria, and acellular pertussis (Tdap, Td) vaccine. You may need a Td booster every 10 years. Zoster vaccine. You may need this after age 71. Pneumococcal 13-valent conjugate (PCV13) vaccine. You may need this if you have certain conditions and were not previously vaccinated. Pneumococcal polysaccharide (PPSV23) vaccine. You may need one or two doses if you smoke cigarettes or if you have certain conditions. Talk to your health care provider about which screenings and vaccines you need and how often you need them. This information is not intended to replace advice given to you by your health care provider. Make sure you discuss any questions you have with your health care provider. Document Released: 03/31/2015 Document Revised: 11/22/2015 Document Reviewed: 01/03/2015 Elsevier Interactive Patient Education  2017 [employer].    Fall Prevention in the Home Falls can cause injuries. They can happen to people of all ages. There are many things you can do to make your home safe and to help prevent falls. What can I do on the outside of my home? Regularly fix the edges of walkways and driveways and fix any cracks. Remove anything that might make you trip as you walk through a door, such as a raised step or threshold. Trim any bushes or trees on the path to your home. Use bright outdoor lighting. Clear any walking  paths of anything that might make someone trip, such as rocks or tools. Regularly check to see if handrails are loose or broken. Make sure that both sides of any steps have handrails. Any raised decks and porches should have guardrails on the edges. Have any leaves, snow, or ice cleared regularly. Use sand or salt on walking paths during winter. Clean up any spills in your garage right away. This includes oil or grease spills. What can I do in the bathroom? Use night lights. Install grab bars by the toilet and in the tub and shower. Do not use towel bars as grab bars. Use non-skid mats or decals in the tub or shower. If you need to sit down in the shower, use a plastic, non-slip stool. Keep the floor dry. Clean up any water that spills on the floor as soon as it happens. Remove soap buildup in the tub or shower regularly. Attach bath mats securely with double-sided non-slip rug tape. Do not have throw rugs and other things on the floor that can make you trip. What can I do in the bedroom? Use night lights. Make sure that you have a light by your bed that is easy to reach. Do  not use any sheets or blankets that are too big for your bed. They should not hang down onto the floor. Have a firm chair that has side arms. You can use this for support while you get dressed. Do not have throw rugs and other things on the floor that can make you trip. What can I do in the kitchen? Clean up any spills right away. Avoid walking on wet floors. Keep items that you use a lot in easy-to-reach places. If you need to reach something above you, use a strong step stool that has a grab bar. Keep electrical cords out of the way. Do not use floor polish or wax that makes floors slippery. If you must use wax, use non-skid floor wax. Do not have throw rugs and other things on the floor that can make you trip. What can I do with my stairs? Do not leave any items on the stairs. Make sure that there are handrails  on both sides of the stairs and use them. Fix handrails that are broken or loose. Make sure that handrails are as long as the stairways. Check any carpeting to make sure that it is firmly attached to the stairs. Fix any carpet that is loose or worn. Avoid having throw rugs at the top or bottom of the stairs. If you do have throw rugs, attach them to the floor with carpet tape. Make sure that you have a light switch at the top of the stairs and the bottom of the stairs. If you do not have them, ask someone to add them for you. What else can I do to help prevent falls? Wear shoes that: Do not have high heels. Have rubber bottoms. Are comfortable and fit you well. Are closed at the toe. Do not wear sandals. If you use a stepladder: Make sure that it is fully opened. Do not climb a closed stepladder. Make sure that both sides of the stepladder are locked into place. Ask someone to hold it for you, if possible. Clearly mark and make sure that you can see: Any grab bars or handrails. First and last steps. Where the edge of each step is. Use tools that help you move around (mobility aids) if they are needed. These include: Canes. Walkers. Scooters. Crutches. Turn on the lights when you go into a dark area. Replace any light bulbs as soon as they burn out. Set up your furniture so you have a clear path. Avoid moving your furniture around. If any of your floors are uneven, fix them. If there are any pets around you, be aware of where they are. Review your medicines with your doctor. Some medicines can make you feel dizzy. This can increase your chance of falling. Ask your doctor what other things that you can do to help prevent falls. This information is not intended to replace advice given to you by your health care provider. Make sure you discuss any questions you have with your health care provider. Document Released: 12/29/2008 Document Revised: 08/10/2015 Document Reviewed:  04/08/2014 Elsevier Interactive Patient Education  2017 [employer].

## 2022-08-30 NOTE — Progress Notes (Signed)
Subjective:   Ashley Torres is a 67 y.o. female who presents for an Initial Medicare Annual Wellness Visit.  I connected with  Janis Moger on 08/30/22 by a audio enabled telemedicine application and verified that I am speaking with the correct person using two identifiers.  Patient Location: Home  Provider Location: Home Office  I discussed the limitations of evaluation and management by telemedicine. The patient expressed understanding and agreed to proceed.  Review of Systems     Cardiac Risk Factors include: sedentary lifestyle;hypertension     Objective:    Today's Vitals   08/30/22 1234  Weight: 165 lb (74.8 kg)  Height: 4' 9.5" (1.461 m)   Body mass index is 35.09 kg/m.     08/30/2022   12:42 PM 08/19/2022   10:03 AM 04/25/2022    1:52 PM 01/03/2022   11:27 AM 08/16/2021   10:38 AM 07/27/2021    2:16 PM 08/30/2020    1:40 PM  Advanced Directives  Does Patient Have a Medical Advance Directive? No No No No No No No  Would patient like information on creating a medical advance directive? Yes (MAU/Ambulatory/Procedural Areas - Information given) No - Patient declined No - Patient declined No - Patient declined No - Patient declined No - Patient declined No - Patient declined    Current Medications (verified) Outpatient Encounter Medications as of 08/30/2022  Medication Sig   acetaminophen (TYLENOL) 500 MG tablet Take 500 mg by mouth daily as needed for moderate pain.    albuterol (VENTOLIN HFA) 108 (90 Base) MCG/ACT inhaler Inhale 2 puffs into the lungs every 4 (four) hours as needed for wheezing or shortness of breath.   ALPHAGAN P 0.1 % SOLN Place 1 drop into the right eye 2 (two) times daily.   amoxicillin (AMOXIL) 500 MG tablet TAKE 4 TABLETS BY MOUTH 30 TO 60 MINUTES PRIOR TO DENTAL PROCEDURES   aspirin EC 81 MG tablet Take 81 mg by mouth daily.   Calcium Carb-Cholecalciferol [phone] MG-UNIT CAPS Take 1 tablet by mouth daily.   calcium carbonate (TUMS - DOSED IN  MG ELEMENTAL CALCIUM) 500 MG chewable tablet Chew 1 tablet by mouth daily as needed for indigestion.   chlorpheniramine (CHLOR-TRIMETON) 4 MG tablet Take 4 mg by mouth daily as needed for allergies.   Evolocumab (REPATHA SURECLICK) 140 MG/ML SOAJ Inject 140 mg into the skin every 14 (fourteen) days.   famotidine (PEPCID) 10 MG tablet Take 1 tablet (10 mg total) by mouth daily.   fexofenadine (ALLEGRA) 180 MG tablet Take 180 mg by mouth daily as needed for allergies.   FOLIC ACID PO Take 1 tablet by mouth daily.   GLYCERIN-HYPROMELLOSE-PEG 400 OP Place 1 drop into both eyes daily as needed (Dry eyes).   hydrochlorothiazide (HYDRODIURIL) 25 MG tablet Take 1 tablet daily by mouth   levothyroxine (SYNTHROID) 100 MCG tablet Take 1 tablet (100 mcg total) by mouth daily before breakfast.   lisinopril (ZESTRIL) 10 MG tablet TAKE 1 TABLET(10 MG) BY MOUTH DAILY   meclizine (ANTIVERT) 25 MG tablet Take 1 tablet (25 mg total) by mouth 3 (three) times daily as needed for dizziness.   Multiple Vitamins-Minerals (OCUVITE PRESERVISION PO) Take 1 tablet by mouth daily.   vitamin C (ASCORBIC ACID) 250 MG tablet Take 250 mg by mouth daily.   XIIDRA 5 % SOLN Apply 1 drop to eye 2 (two) times daily.   No facility-administered encounter medications on file as of 08/30/2022.    Allergies (verified) Statins,  Atorvastatin, Erythromycin, Furosemide, Hydrocodone-acetaminophen, Meloxicam, Metoprolol, Montelukast, Omeprazole, Potassium chloride, and Rosuvastatin   History: Past Medical History:  Diagnosis Date   Allergic rhinitis    Anxiety    Aortic stenosis    Aphakia of left eye    Aphakia of left eye 06/29/2014   Bicuspid aortic valve    Blindness of left eye    Cervical prolapse 09/14/2014   Stage 3 cervical prolapse with urge incontinence per Gynecology. 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   Overview:   - congential deafness - congenital blindness in Left Eye - congenital aortic stenosis   Deafness congenital    Drusen of right optic disc    Environmental and seasonal allergies    Gastric stress ulcer    GERD (gastroesophageal reflux disease) 1990   H/O aortic coarctation repair    H/O aortic valve replacement 01/13/2016   Congenital biscuspid aortic valve s/p replacement 03/2015 and coarctation of aorta s/p repair in childhood   H/O coarctation of aorta 01/13/2016   HLD (hyperlipidemia) 07/17/2013   Overview:  - Lipid at goal (ASCVD 10-yr risk 3.1%) on Simvastatin 20 mg , d/c statin in 2016 for myalgias. Started red rice yeast. Statin restarted in hospital and now patient off all meds for this  Last Assessment & Plan:  Remains at goal    HTN (hypertension) 01/13/2016   Overview:  - Goal SBP <140 - Lisinopril 10 mg p.o. (07/20/19 decreased to 10 mg from 20 mg for dizziness symptoms).  -    Hyperlipidemia 1997   Hypertension 1997   Hypothyroidism    Hypothyroidism (acquired) 07/17/2013   Overview:  - secondary to iatrogenic thyroidectomy for benign nodule - levothyroxine 88 mg/day   Nuclear sclerosis of right eye    Obesity    Obesity, Class II, BMI 35-39.9 07/17/2013   Overview:  - weight stable with diet and exercise - consider bariatric surgery given associated HTN   Rubella syndrome, congenital    S/P AVR (aortic valve replacement)    Scoliosis    Uterine prolapse    Ventral hernia without obstruction or gangrene 07/21/2019   Vision impairment    Vision loss, left eye    Past Surgical History:  Procedure Laterality Date   AORTIC VALVE REPLACEMENT  2017   breast biopsies     2001, 2002, & 2005    BREAST EXCISIONAL BIOPSY Left    CARDIAC CATHETERIZATION     COARCTATION OF AORTA REPAIR     repaired at age 7   EYE SURGERY  1964   blindness caused by german measles   RADIOLOGY WITH ANESTHESIA N/A 02/07/2021   Procedure: MRI WITH ANESTHESIA- BRAIN WITH /WITHOUT  CONSTRAST ORBITS WITH  / WITHOUT CONSTRAST;  Surgeon: Radiologist, Medication, MD;  Location: [facility] OR;  Service: Radiology;  Laterality: N/A;   THYROID SURGERY  1997   benign mass   Family History  Problem Relation Age of Onset   Other Mother        killed in gas explosion   Asthma Mother    Heart murmur Father    Arthritis Father    Hyperlipidemia Father    GER disease Father    Arthritis Brother    Sinusitis Brother    Diabetes Paternal Grandmother    Rheumatic fever Paternal Grandmother    Breast cancer Other    Heart disease  Neg Hx    Social History   Socioeconomic History   Marital status: Single    Spouse name: Not on file   Number of children: Not on file   Years of education: some college   Highest education level: Not on file  Occupational History    Comment: unemployed  Tobacco Use   Smoking status: Never    Passive exposure: Never   Smokeless tobacco: Never  Vaping Use   Vaping Use: Never used  Substance and Sexual Activity   Alcohol use: Never   Drug use: Never   Sexual activity: Never  Other Topics Concern   Not on file  Social History Narrative   Lives alone   Right handed   Caffeine: chocolate only, not often   Social Determinants of Health   Financial Resource Strain: Low Risk  (08/30/2022)   Overall Financial Resource Strain (CARDIA)    Difficulty of Paying Living Expenses: Not hard at all  Food Insecurity: No Food Insecurity (08/30/2022)   Hunger Vital Sign    Worried About Running Out of Food in the Last Year: Never true    Ran Out of Food in the Last Year: Never true  Transportation Needs: No Transportation Needs (08/30/2022)   PRAPARE - Administrator, Civil Service (Medical): No    Lack of Transportation (Non-Medical): No  Physical Activity: Insufficiently Active (08/30/2022)   Exercise Vital Sign    Days of Exercise per Week: 3 days    Minutes of Exercise per Session: 30 min  Stress: No Stress Concern Present (08/30/2022)   [employer] of  Occupational Health - Occupational Stress Questionnaire    Feeling of Stress : Not at all  Social Connections: Socially Isolated (08/30/2022)   Social Connection and Isolation Panel [NHANES]    Frequency of Communication with Friends and Family: More than three times a week    Frequency of Social Gatherings with Friends and Family: Three times a week    Attends Religious Services: Never    Active Member of Clubs or Organizations: No    Attends Engineer, structural: Never    Marital Status: Never married    Tobacco Counseling Counseling given: Not Answered   Clinical Intake:  Pre-visit preparation completed: Yes  Pain : No/denies pain  Diabetes: No  How often do you need to have someone help you when you read instructions, pamphlets, or other written materials from your doctor or pharmacy?: 1 - Never  Diabetic?No   Interpreter Needed?: No  Comments: Uses adaptive phone that provides sign language interpretation services Information entered by :: Kandis Fantasia LPN   Activities of Daily Living    08/30/2022   12:41 PM  In your present state of health, do you have any difficulty performing the following activities:  Hearing? 0  Vision? 0  Difficulty concentrating or making decisions? 0  Walking or climbing stairs? 0  Dressing or bathing? 0  Doing errands, shopping? 0  Preparing Food and eating ? N  Using the Toilet? N  In the past six months, have you accidently leaked urine? N  Do you have problems with loss of bowel control? N  Managing your Medications? N  Managing your Finances? N  Housekeeping or managing your Housekeeping? N    Patient Care Team: Darral Dash, DO as PCP - General (Family Medicine) Lars Masson, MD as PCP - Cardiology (Cardiology)  Indicate any recent Medical Services you may have received from other than [facility] providers  in the past year (date may be approximate).     Assessment:   This is a routine wellness examination  for Serita.  Hearing/Vision screen Hearing Screening - Comments:: Congenital Deafness; followed by audiology and ENT Vision Screening - Comments::  up to date with routine eye exams with [facility]    Dietary issues and exercise activities discussed: Current Exercise Habits: Home exercise routine, Type of exercise: walking, Time (Minutes): 30, Frequency (Times/Week): 3, Weekly Exercise (Minutes/Week): 90, Intensity: Mild   Goals Addressed             This Visit's Progress    Remain active and independent        Depression Screen    08/30/2022   12:40 PM 08/19/2022   10:03 AM 04/25/2022    1:57 PM 01/03/2022   11:26 AM 08/16/2021   10:36 AM 07/27/2021    2:17 PM 08/30/2020    1:39 PM  PHQ 2/9 Scores  PHQ - 2 Score 0 0 0 3 0 0 0  PHQ- 9 Score 0 0 0 3 0 0 0    Fall Risk    08/30/2022   12:41 PM 08/19/2022   10:03 AM 01/03/2022   11:26 AM 01/04/2019    2:45 PM 11/03/2018    3:50 PM  Fall Risk   Falls in the past year? 0 0 0 0 0  Number falls in past yr: 0    0  Injury with Fall? 0      Risk for fall due to : No Fall Risks      Follow up Falls prevention discussed;Education provided;Falls evaluation completed    Falls evaluation completed    FALL RISK PREVENTION PERTAINING TO THE HOME:  Any stairs in or around the home? No  If so, are there any without handrails? No  Home free of loose throw rugs in walkways, pet beds, electrical cords, etc? Yes  Adequate lighting in your home to reduce risk of falls? Yes   ASSISTIVE DEVICES UTILIZED TO PREVENT FALLS:  Life alert? No  Use of a cane, walker or w/c? Yes  Grab bars in the bathroom? Yes  Shower chair or bench in shower? No  Elevated toilet seat or a handicapped toilet? Yes   TIMED UP AND GO:  Was the test performed? No . Telephonic visit   Cognitive Function:        08/30/2022   12:42 PM  6CIT Screen  What Year? 0 points  What month? 0 points  What time? 0 points  Count back from 20 0 points  Months in  reverse 0 points  Repeat phrase 0 points  Total Score 0 points    Immunizations Immunization History  Administered Date(s) Administered   Influenza Split 12/08/2014   Influenza, Seasonal, Injecte, Preservative Fre 12/22/2013   Influenza,inj,Quad PF,6+ Mos 01/04/2019, 01/11/2020   Influenza-Unspecified 01/11/2020   Moderna SARS-COV2 Booster Vaccination 12/21/2020   Moderna Sars-Covid-2 Vaccination 06/30/2019, 07/28/2019, 07/05/2020   PNEUMOCOCCAL CONJUGATE-20 08/30/2020   Pfizer Covid-19 Vaccine Bivalent Booster 9yrs & up 07/27/2021   Pneumococcal Polysaccharide-23 09/21/2013   Tdap 09/21/2013, 03/03/2020   Zoster Recombinat (Shingrix) 04/14/2020, 07/25/2020    TDAP status: Up to date  Pneumococcal vaccine status: Up to date  Covid-19 vaccine status: Information provided on how to obtain vaccines.   Qualifies for Shingles Vaccine? Yes   Zostavax completed No   Shingrix Completed?: Yes  Screening Tests Health Maintenance  Topic Date Due   COVID-19 Vaccine (5 -  2023-24 season) 11/16/2021   INFLUENZA VACCINE  10/17/2022   MAMMOGRAM  05/29/2023   Medicare Annual Wellness (AWV)  08/30/2023   Colonoscopy  01/12/2024   PAP SMEAR-Modifier  08/18/2025   DTaP/Tdap/Td (3 - Td or Tdap) 03/03/2030   Hepatitis C Screening  Completed   HIV Screening  Completed   Zoster Vaccines- Shingrix  Completed   HPV VACCINES  Aged Out    Health Maintenance  Health Maintenance Due  Topic Date Due   COVID-19 Vaccine (5 - 2023-24 season) 11/16/2021    Colorectal cancer screening: Type of screening: Colonoscopy. Completed 01/11/14. Repeat every 10 years  Mammogram status: Completed 05/29/22. Repeat every year  Lung Cancer Screening: (Low Dose CT Chest recommended if Age 50-80 years, 30 pack-year currently smoking OR have quit w/in 15years.) does not qualify.   Lung Cancer Screening Referral: n/a  Additional Screening:  Hepatitis C Screening: does qualify; Completed 09/28/13  Vision  Screening: Recommended annual ophthalmology exams for early detection of glaucoma and other disorders of the eye. Is the patient up to date with their annual eye exam?  Yes  Who is the provider or what is the name of the office in which the patient attends annual eye exams? [facility]  If pt is not established with a provider, would they like to be referred to a provider to establish care? No .   Dental Screening: Recommended annual dental exams for proper oral hygiene  Community Resource Referral / Chronic Care Management: CRR required this visit?  No   CCM required this visit?  No      Plan:     I have personally reviewed and noted the following in the patient's chart:   Medical and social history Use of alcohol, tobacco or illicit drugs  Current medications and supplements including opioid prescriptions. Patient is not currently taking opioid prescriptions. Functional ability and status Nutritional status Physical activity Advanced directives List of other physicians Hospitalizations, surgeries, and ER visits in previous 12 months Vitals Screenings to include cognitive, depression, and falls Referrals and appointments  In addition, I have reviewed and discussed with patient certain preventive protocols, quality metrics, and best practice recommendations. A written personalized care plan for preventive services as well as general preventive health recommendations were provided to patient.     Durwin Nora, [place]   04/05/1476   Due to this being a virtual visit, the after visit summary with patients personalized plan was offered to patient via mail or my-chart.  per request, patient was mailed a copy of AVS.  Nurse Notes: No concerns

## 2022-09-02 ENCOUNTER — Telehealth: Payer: Self-pay | Admitting: Family Medicine

## 2022-09-02 NOTE — Telephone Encounter (Signed)
HIPAA compliant callback message left.   Note: Excision biopsy of her groin lesion showed pigmented seborrheic keratosis involving the margins. It means this can re-grow. I will discuss with her when she returns my calls.

## 2022-09-02 NOTE — Telephone Encounter (Signed)
Patient LVM on nurse line requesting returned call.   She states, "I am tired of always getting the answering machine, this is a waste of my time."   Requesting returned call to [phone].   Veronda Prude, RN

## 2022-09-03 NOTE — Telephone Encounter (Signed)
I called confirmed her name and DOB.  Test result discussed. Possibility of recurrence discussed. We'll monitor for now. Incision area looks good, but itchy per the patient. This could be sign of healing. Red flag signs discussed. Otherwise, f/u as planned for suture removal.  She was appreciative of the call.

## 2022-09-09 ENCOUNTER — Telehealth: Payer: Self-pay | Admitting: Family Medicine

## 2022-09-09 NOTE — Telephone Encounter (Signed)
I called to inform patient that the clinic does not open till 2:30 pm on 09/11/22.  She has a 2 pm appointment with me for suture removal. She is fine with coming in at 2:30 pm and requested that we notify  her interpreter. I will reach out to the front office staff and Jone Baseman to help with this.

## 2022-09-11 ENCOUNTER — Ambulatory Visit (INDEPENDENT_AMBULATORY_CARE_PROVIDER_SITE_OTHER): Payer: Medicare HMO | Admitting: Family Medicine

## 2022-09-11 ENCOUNTER — Encounter: Payer: Self-pay | Admitting: Family Medicine

## 2022-09-11 VITALS — Wt 165.6 lb

## 2022-09-11 DIAGNOSIS — L821 Other seborrheic keratosis: Secondary | ICD-10-CM

## 2022-09-11 NOTE — Telephone Encounter (Signed)
HIPAA compliant callback message left. 

## 2022-09-11 NOTE — Assessment & Plan Note (Signed)
Pathology report reviewed. Result previously discussed with the patient.  She has no new concerns. Incision site cleaned with alcohol swab. All 6 sutures removed without complications. Incision cleaned with betadine and covered with brown bandage. May continue regular home wound care.  F/U with PCP for other health concerns. She was appreciative of the care provided.

## 2022-09-11 NOTE — Patient Instructions (Signed)

## 2022-09-11 NOTE — Progress Notes (Signed)
    SUBJECTIVE:   CHIEF COMPLAINT / HPI:   Seborrheic Keratosis: S/P excisional biopsy. Here for wound check and suture removal. Some occasional itching over the incision area. Otherwise, no concerns.   PERTINENT  PMH / PSH: PMHx reviewed  OBJECTIVE:   Wt 165 lb 9.6 oz (75.1 kg)   LMP  (LMP Unknown)   BMI 35.21 kg/m   She declined BP check.  Physical Exam Vitals reviewed.  Constitutional:      Appearance: Normal appearance.  Pulmonary:     Effort: Pulmonary effort is normal. No respiratory distress.  Skin:    Comments: Left groin incision healed well with no signs of infection or inflammation. 6 sutures in place.      ASSESSMENT/PLAN:   Seborrheic keratosis Pathology report reviewed. Result previously discussed with the patient.  She has no new concerns. Incision site cleaned with alcohol swab. All 6 sutures removed without complications. Incision cleaned with betadine and covered with brown bandage. May continue regular home wound care.  F/U with PCP for other health concerns. She was appreciative of the care provided.      Janit Pagan, MD [facility]

## 2022-09-13 DIAGNOSIS — H40011 Open angle with borderline findings, low risk, right eye: Secondary | ICD-10-CM | POA: Diagnosis not present

## 2022-09-16 ENCOUNTER — Ambulatory Visit (HOSPITAL_COMMUNITY): Payer: Medicare HMO | Attending: Cardiology

## 2022-09-16 DIAGNOSIS — Z8774 Personal history of (corrected) congenital malformations of heart and circulatory system: Secondary | ICD-10-CM | POA: Insufficient documentation

## 2022-09-16 DIAGNOSIS — Z952 Presence of prosthetic heart valve: Secondary | ICD-10-CM | POA: Diagnosis not present

## 2022-09-16 DIAGNOSIS — Q231 Congenital insufficiency of aortic valve: Secondary | ICD-10-CM | POA: Insufficient documentation

## 2022-09-16 LAB — ECHOCARDIOGRAM COMPLETE
AR max vel: 0.57 cm2
AV Area VTI: 0.56 cm2
AV Area mean vel: 0.56 cm2
AV Mean grad: 20 mmHg
AV Peak grad: 35.3 mmHg
Ao pk vel: 2.97 m/s
Area-P 1/2: 3.13 cm2
S' Lateral: 1.9 cm

## 2022-09-18 ENCOUNTER — Telehealth: Payer: Self-pay | Admitting: Cardiology

## 2022-09-18 DIAGNOSIS — Z0100 Encounter for examination of eyes and vision without abnormal findings: Secondary | ICD-10-CM | POA: Diagnosis not present

## 2022-09-18 NOTE — Telephone Encounter (Signed)
Patient notified by sign language interpreter

## 2022-09-18 NOTE — Telephone Encounter (Signed)
Patient called back for results. States its okay if its mail out to her. Please advise

## 2022-09-18 NOTE — Telephone Encounter (Signed)
-----   Message from Meriam Sprague, MD sent at 09/16/2022  1:00 PM EDT ----- Her echo shows normal pumping function. There is moderately increased gradients across her aortic valve which are stable from prior and indicate moderate narrowing. There is also some elevated gradients at the level of her aorta but her CT scan from this year looks great with no significant narrowing at her prior coarctation repair site. She has mild leakiness of her mitral valve which is stable. We will need to continue with yearly echoes for monitoring of her valve. We can keep the every other year CT scans if she prefers

## 2022-09-30 DIAGNOSIS — H47011 Ischemic optic neuropathy, right eye: Secondary | ICD-10-CM | POA: Diagnosis not present

## 2022-09-30 DIAGNOSIS — H2511 Age-related nuclear cataract, right eye: Secondary | ICD-10-CM | POA: Diagnosis not present

## 2022-09-30 DIAGNOSIS — H04123 Dry eye syndrome of bilateral lacrimal glands: Secondary | ICD-10-CM | POA: Diagnosis not present

## 2022-09-30 DIAGNOSIS — H47321 Drusen of optic disc, right eye: Secondary | ICD-10-CM | POA: Diagnosis not present

## 2022-10-09 ENCOUNTER — Ambulatory Visit: Payer: Medicare HMO | Admitting: Student

## 2022-10-21 ENCOUNTER — Other Ambulatory Visit: Payer: Self-pay

## 2022-10-21 DIAGNOSIS — E039 Hypothyroidism, unspecified: Secondary | ICD-10-CM

## 2022-10-21 DIAGNOSIS — I5032 Chronic diastolic (congestive) heart failure: Secondary | ICD-10-CM

## 2022-10-21 DIAGNOSIS — Z8774 Personal history of (corrected) congenital malformations of heart and circulatory system: Secondary | ICD-10-CM

## 2022-10-21 DIAGNOSIS — Z952 Presence of prosthetic heart valve: Secondary | ICD-10-CM

## 2022-10-21 DIAGNOSIS — E785 Hyperlipidemia, unspecified: Secondary | ICD-10-CM

## 2022-10-21 DIAGNOSIS — Q231 Congenital insufficiency of aortic valve: Secondary | ICD-10-CM

## 2022-10-21 DIAGNOSIS — I10 Essential (primary) hypertension: Secondary | ICD-10-CM

## 2022-10-21 NOTE — Telephone Encounter (Signed)
Pt's pharmacy is requesting a refill on albuterol inhaler. Would Dr. Johney Frame like to refill this medication? Please address

## 2022-10-23 ENCOUNTER — Other Ambulatory Visit: Payer: Self-pay | Admitting: *Deleted

## 2022-10-23 DIAGNOSIS — Z952 Presence of prosthetic heart valve: Secondary | ICD-10-CM

## 2022-10-23 DIAGNOSIS — Q2381 Bicuspid aortic valve: Secondary | ICD-10-CM

## 2022-10-23 DIAGNOSIS — Z8774 Personal history of (corrected) congenital malformations of heart and circulatory system: Secondary | ICD-10-CM

## 2022-10-23 DIAGNOSIS — E039 Hypothyroidism, unspecified: Secondary | ICD-10-CM

## 2022-10-23 DIAGNOSIS — I10 Essential (primary) hypertension: Secondary | ICD-10-CM

## 2022-10-23 DIAGNOSIS — E785 Hyperlipidemia, unspecified: Secondary | ICD-10-CM

## 2022-10-23 DIAGNOSIS — I5032 Chronic diastolic (congestive) heart failure: Secondary | ICD-10-CM

## 2022-10-23 DIAGNOSIS — Q231 Congenital insufficiency of aortic valve: Secondary | ICD-10-CM

## 2022-10-23 MED ORDER — ALBUTEROL SULFATE HFA 108 (90 BASE) MCG/ACT IN AERS
2.0000 | INHALATION_SPRAY | RESPIRATORY_TRACT | 0 refills | Status: DC | PRN
Start: 2022-10-23 — End: 2023-03-05

## 2022-10-28 ENCOUNTER — Ambulatory Visit (INDEPENDENT_AMBULATORY_CARE_PROVIDER_SITE_OTHER): Payer: Medicare HMO | Admitting: Student

## 2022-10-28 ENCOUNTER — Encounter: Payer: Self-pay | Admitting: Student

## 2022-10-28 VITALS — BP 118/75 | HR 84 | Ht <= 58 in | Wt 164.0 lb

## 2022-10-28 DIAGNOSIS — Z1211 Encounter for screening for malignant neoplasm of colon: Secondary | ICD-10-CM | POA: Diagnosis not present

## 2022-10-28 DIAGNOSIS — K5900 Constipation, unspecified: Secondary | ICD-10-CM | POA: Diagnosis not present

## 2022-10-28 MED ORDER — POLYETHYLENE GLYCOL 3350 17 GM/SCOOP PO POWD: 17.0000 g | Freq: Every day | ORAL | 3 refills | Status: DC

## 2022-10-28 NOTE — Patient Instructions (Addendum)
It was great seeing you today.  As we discussed, -Start using MiraLAX every single day.  You may start with 1 capful, if you do not have regular soft bowel movement 3 days, increase to 2 capfuls. -I sent a referral to the gastroenterologist so that we can get you scheduled for a colonoscopy.  If you do not hear from them in 3 weeks, please let us know -Try to increase your fluid hydration.  Ideally, you will have 5 to 8 glasses of water a day   If you have any questions or concerns, please feel free to call the clinic.   Have a wonderful day,  Dr. Darral Dash [facility] [phone]

## 2022-10-28 NOTE — Progress Notes (Cosign Needed Addendum)
    SUBJECTIVE:   CHIEF COMPLAINT / HPI:   Ashley Torres is a 67 year old female here to discuss constipation. This has been going on for the past year intermittently.  She says that she has "hard pellet stools" at times.  She oscillates between taking Dulcolax, prune juice which does help her.  But, she does not have consistency in her bowel movements.  She is having a bowel movement about 3 times a week right now. Has not seen any blood in her stool or on toilet paper.  Denies change in stool caliber in terms of having pencil thin stools.    Drinks 2 bottles of water and a Gatorade zero a day.  She says she will be due for her colonoscopy in 2025.  She is otherwise feeling well and denies any systemic symptoms including fevers, chills, weight loss, night sweats.  *In person ASL interpreter used throughout visit  PERTINENT  PMH / PSH: Hypertension, GERD, hypothyroidism, congenital deafness, papilledema of right eye  OBJECTIVE:   BP 118/75   Pulse 84   Ht 4' 9.5" (1.461 m)   Wt 164 lb (74.4 kg)   LMP  (LMP Unknown)   SpO2 94%   BMI 34.87 kg/m   General: Pleasant and well-appearing Cardiac: Regular rate and rhythm Respiratory: Normal work of breathing on room air Abdomen: Soft, nontender and nondistended.  No palpable masses Extremities: Warm and well-perfused   ASSESSMENT/PLAN:   Constipation No red flag signs or symptoms on exam today.  No concern for bowel obstruction, malignancy, medication induced constipation Hypothyroidism is well-controlled, last TSH was within normal limits, so likely not contributing Advised to increase fiber in diet.  Advised to start using MiraLAX daily and that it may take up to 2 days for her to start to have effects.  She is to start with 1 capful daily, and titrate upward as needed to have 1 soft bowel movement daily Referral was sent to GI to get her set up for colonoscopy, advised that they may do this sooner than 2025 given her  symptoms     Darral Dash, DO [facility]

## 2022-10-29 DIAGNOSIS — K5900 Constipation, unspecified: Secondary | ICD-10-CM | POA: Insufficient documentation

## 2022-10-29 NOTE — Assessment & Plan Note (Signed)
No red flag signs or symptoms on exam today.  No concern for bowel obstruction, malignancy, medication induced constipation Hypothyroidism is well-controlled, last TSH was within normal limits, so likely not contributing Advised to increase fiber in diet.  Advised to start using MiraLAX daily and that it may take up to 2 days for her to start to have effects.  She is to start with 1 capful daily, and titrate upward as needed to have 1 soft bowel movement daily Referral was sent to GI to get her set up for colonoscopy, advised that they may do this sooner than 2025 given her symptoms

## 2022-11-06 NOTE — Addendum Note (Signed)
Addended by: Caro Laroche on: 11/06/2022 08:51 PM   Modules accepted: Orders

## 2022-12-10 DIAGNOSIS — H40011 Open angle with borderline findings, low risk, right eye: Secondary | ICD-10-CM | POA: Diagnosis not present

## 2022-12-11 ENCOUNTER — Telehealth: Payer: Self-pay | Admitting: Gastroenterology

## 2022-12-11 NOTE — Telephone Encounter (Signed)
Good morning Dr. Myrtie Neither,     Supervising Provider 12/11/2022 AM   We received a referral for patient to have a colonoscopy. Patient last had a colonoscopy in 2015 in [place]. Patient now lives in [place] and wishing to continue care with [facility]. Patient previous record is in [facility] for you to review and advise on scheduling.    Thank you.

## 2022-12-11 NOTE — Telephone Encounter (Signed)
I reviewed the most recent primary care note, this patient is referred for constipation.  Next available new patient appointment  Please make a note that she needs an American sign language (ASL) interpreter for the visit  H Danis

## 2022-12-12 ENCOUNTER — Encounter: Payer: Self-pay | Admitting: Gastroenterology

## 2022-12-12 NOTE — Telephone Encounter (Signed)
Called patient to scheduled. Left voicemail.

## 2023-01-09 DIAGNOSIS — H40011 Open angle with borderline findings, low risk, right eye: Secondary | ICD-10-CM | POA: Diagnosis not present

## 2023-02-17 DIAGNOSIS — H04123 Dry eye syndrome of bilateral lacrimal glands: Secondary | ICD-10-CM | POA: Diagnosis not present

## 2023-02-17 DIAGNOSIS — H02886 Meibomian gland dysfunction of left eye, unspecified eyelid: Secondary | ICD-10-CM | POA: Diagnosis not present

## 2023-02-17 DIAGNOSIS — H02883 Meibomian gland dysfunction of right eye, unspecified eyelid: Secondary | ICD-10-CM | POA: Diagnosis not present

## 2023-02-17 DIAGNOSIS — H2513 Age-related nuclear cataract, bilateral: Secondary | ICD-10-CM | POA: Diagnosis not present

## 2023-02-17 DIAGNOSIS — H47321 Drusen of optic disc, right eye: Secondary | ICD-10-CM | POA: Diagnosis not present

## 2023-03-05 ENCOUNTER — Other Ambulatory Visit: Payer: Self-pay

## 2023-03-05 DIAGNOSIS — E039 Hypothyroidism, unspecified: Secondary | ICD-10-CM

## 2023-03-05 DIAGNOSIS — I5032 Chronic diastolic (congestive) heart failure: Secondary | ICD-10-CM

## 2023-03-05 DIAGNOSIS — Z952 Presence of prosthetic heart valve: Secondary | ICD-10-CM

## 2023-03-05 DIAGNOSIS — I10 Essential (primary) hypertension: Secondary | ICD-10-CM

## 2023-03-05 DIAGNOSIS — E785 Hyperlipidemia, unspecified: Secondary | ICD-10-CM

## 2023-03-05 DIAGNOSIS — Q2381 Bicuspid aortic valve: Secondary | ICD-10-CM

## 2023-03-05 DIAGNOSIS — Z8774 Personal history of (corrected) congenital malformations of heart and circulatory system: Secondary | ICD-10-CM

## 2023-03-06 ENCOUNTER — Telehealth (HOSPITAL_BASED_OUTPATIENT_CLINIC_OR_DEPARTMENT_OTHER): Payer: Self-pay | Admitting: Cardiology

## 2023-03-06 MED ORDER — REPATHA SURECLICK 140 MG/ML ~~LOC~~ SOAJ: 1.0000 | SUBCUTANEOUS | 3 refills | Status: DC

## 2023-03-06 MED ORDER — MECLIZINE HCL 25 MG PO TABS: 25.0000 mg | ORAL_TABLET | Freq: Three times a day (TID) | ORAL | 3 refills | Status: DC | PRN

## 2023-03-06 MED ORDER — ALBUTEROL SULFATE HFA 108 (90 BASE) MCG/ACT IN AERS
2.0000 | INHALATION_SPRAY | RESPIRATORY_TRACT | 0 refills | Status: DC | PRN
Start: 2023-03-06 — End: 2023-09-02

## 2023-03-06 NOTE — Telephone Encounter (Signed)
 *  STAT* If patient is at the pharmacy, call can be transferred to refill team.   1. Which medications need to be refilled? (please list name of each medication and dose if known)   Evolocumab (REPATHA SURECLICK) 140 MG/ML SOAJ   2. Which pharmacy/location (including street and city if local pharmacy) is medication to be sent to?  [id number] - [place], [place] - [address] AT [facility] OF [address]    3. Do they need a 30 day or 90 day supply? 90

## 2023-03-21 ENCOUNTER — Encounter: Payer: Self-pay | Admitting: Gastroenterology

## 2023-03-21 ENCOUNTER — Ambulatory Visit: Payer: Medicare HMO | Admitting: Gastroenterology

## 2023-03-21 VITALS — BP 142/80 | HR 96 | Ht <= 58 in | Wt 170.2 lb

## 2023-03-21 DIAGNOSIS — Z1211 Encounter for screening for malignant neoplasm of colon: Secondary | ICD-10-CM

## 2023-03-21 DIAGNOSIS — R12 Heartburn: Secondary | ICD-10-CM | POA: Diagnosis not present

## 2023-03-21 DIAGNOSIS — K5909 Other constipation: Secondary | ICD-10-CM | POA: Diagnosis not present

## 2023-03-21 MED ORDER — GOLYTELY 236 G PO SOLR: 4000.0000 mL | Freq: Once | ORAL | 0 refills | Status: AC

## 2023-03-21 NOTE — Patient Instructions (Addendum)
 You have been scheduled for a colonoscopy. Please follow written instructions given to you at your visit today.   Please pick up your prep supplies at the pharmacy within the next 1-3 days.  If you use inhalers (even only as needed), please bring them with you on the day of your procedure.  DO NOT TAKE 7 DAYS PRIOR TO TEST- Trulicity (dulaglutide) Ozempic, Wegovy (semaglutide) Mounjaro (tirzepatide) Bydureon Bcise (exanatide extended release)  DO NOT TAKE 1 DAY PRIOR TO YOUR TEST Rybelsus (semaglutide) Adlyxin (lixisenatide) Victoza (liraglutide) Byetta (exanatide) ___________________________________________________________________________   Please call [facility] to get assistance for rides for your procedure- please contact at [phone]   _______________________________________________________  If your blood pressure at your visit was 140/90 or greater, please contact your primary care physician to follow up on this.  _______________________________________________________  If you are age 75 or older, your body mass index should be between 23-30. Your Body mass index is 39.57 kg/m. If this is out of the aforementioned range listed, please consider follow up with your Primary Care Provider.  If you are age 27 or younger, your body mass index should be between 19-25. Your Body mass index is 39.57 kg/m. If this is out of the aformentioned range listed, please consider follow up with your Primary Care Provider.   ________________________________________________________  The [facility] providers would like to encourage you to use MYCHART to communicate with providers for non-urgent requests or questions.  Due to long hold times on the telephone, sending your provider a message by [facility] may be a faster and more efficient way to get a response.  Please allow 48 business hours for a response.  Please remember that this is for non-urgent requests.   _______________________________________________________ It was a pleasure to see you today!  Thank you for trusting me with your gastrointestinal care!

## 2023-03-21 NOTE — Progress Notes (Signed)
 Chief Complaint: Screening colonoscopy Primary GI Doctor: Dr. Legrand  HPI: Patient is a 68 year old female patient who presents as new patient with past medical history of bicuspid aortic valve status post bioprosthetic aortic valve replacement in 03/2015, coarctation of the aorta status post repair as a child,congenital deafness hypertension, hyperlipidemia, hypothyroidism, thyroidectomy, chronic lower extremity edema, chronic sinusitis, and GERD who was referred to me by Dameron, [name], DO on 10/28/2022 for colon screening colonoscopy.    On 10/28/2022 patient was seen by PCP and at that time complained of constipation intermittently for the past year.  Patient was taking Dulcolax, prune juice which does help her.  Last TSH was within normal limits and hypothyroidism well-controlled.  Patient was advised to increase fiber in diet and start using MiraLAX  daily.  Last seen by cardiology on 08/13/2022 and at that time doing well. TTEs with elevated gradients but have been relatively stable with last mean gradient . She has scheduled yearly echoes for monitoring. Last CTA 03/2022 with no evidence of aneurysm, dissection, or re-coarctation. CTA every 2 years.TTE with LVEF 60-65%, G1DD.   Interval History:   Patient presents for colon screening colonoscopy. She reports her only gastrointestinal issue has been with intermittent issues with chronic constipation. During her visit with her PCP she recommenced she increase her fiber and start taking Miralax  po daily which has both helped her constipation. She also added prune juice daily. She has one bowel movement daily. No straining or blood in stool. No abdominal pain, gas, or bloat. She reports she has occasional pyrosis that is controlled with Pepcid  10 mg BID. Denies dysphagia. Denies nausea, vomiting or weight loss. Denies alcohol or tobacco use. She is taking baby aspirin  81 mg po daily.  Her last colonoscopy was 2015 at [facility]  Patient's family history includes colonic polyps in father.  Due to hearing impairment, an ASL interpreter was present during the history-taking and subsequent discussion (and for part of the physical exam) with this patient.   Wt Readings from Last 3 Encounters:  03/21/23 170 lb 4 oz (77.2 kg)  10/28/22 164 lb (74.4 kg)  09/11/22 165 lb 9.6 oz (75.1 kg)    Past Medical History:  Diagnosis Date  . Allergic rhinitis   . Anxiety   . Aortic stenosis   . Aphakia of left eye   . Aphakia of left eye 06/29/2014  . Bicuspid aortic valve   . Blindness of left eye   . Cervical prolapse 09/14/2014   Stage 3 cervical prolapse with urge incontinence per Gynecology. Referred to pelvic floor PT and bladder training  . Chronic back pain 2015  . Chronic edema   . Chronic sinusitis   . 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 Deafness congenital   . Drusen of right optic disc   . Environmental and seasonal allergies   . Gastric stress ulcer   . GERD (gastroesophageal reflux disease) 1990  . H/O aortic coarctation repair   . H/O aortic valve replacement 01/13/2016   Congenital biscuspid aortic valve s/p replacement 03/2015 and coarctation of aorta s/p repair in childhood  . H/O coarctation of aorta 01/13/2016  . HLD (hyperlipidemia) 07/17/2013   Overview:  - Lipid at goal (ASCVD 10-yr risk 3.1%) on Simvastatin 20 mg , d/c statin in 2016 for myalgias. Started red rice yeast. Statin restarted in hospital and now patient off all meds for this  Last Assessment & Plan:  Remains at goal   . HTN (hypertension) 01/13/2016   Overview:  - Goal SBP <140 - Lisinopril  10 mg p.o. (07/20/19 decreased to 10 mg from 20 mg for dizziness symptoms).  -   . Hyperlipidemia 1997  . Hypertension 1997  . Hypothyroidism   . Hypothyroidism (acquired) 07/17/2013   Overview:  - secondary to iatrogenic thyroidectomy for benign nodule -  levothyroxine  88 mg/day  . Nuclear sclerosis of right eye   . Obesity   . Obesity, Class II, BMI 35-39.9 07/17/2013   Overview:  - weight stable with diet and exercise - consider bariatric surgery given associated HTN  . Rubella syndrome, congenital   . S/P AVR (aortic valve replacement)   . Scoliosis   . Uterine prolapse   . Ventral hernia without obstruction or gangrene 07/21/2019  . Vision impairment   . Vision loss, left eye    Past Surgical History:  Procedure Laterality Date  . AORTIC VALVE REPLACEMENT  2017  . breast biopsies     2001, 2002, & 2005   . BREAST EXCISIONAL BIOPSY Left   . CARDIAC CATHETERIZATION    . COARCTATION OF AORTA REPAIR     repaired at age 10  . 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 RADIOLOGY WITH ANESTHESIA N/A 02/07/2021   Procedure: MRI WITH ANESTHESIA- BRAIN WITH /WITHOUT  CONSTRAST ORBITS WITH / WITHOUT CONSTRAST;  Surgeon: Radiologist, Medication, MD;  Location: [facility] OR;  Service: Radiology;  Laterality: N/A;  . THYROID  SURGERY  1997   benign mass   Current Outpatient Medications  Medication Sig Dispense Refill  . acetaminophen  (TYLENOL ) 500 MG tablet Take 500 mg by mouth daily as needed for moderate pain.     . albuterol  (VENTOLIN  HFA) 108 (90 Base) MCG/ACT inhaler Inhale 2 puffs into the lungs every 4 (four) hours as needed for wheezing or shortness of breath. 6.7 g 0  . ALPHAGAN P 0.1 % SOLN Place 1 drop into the right eye 2 (two) times daily.    . aspirin  EC 81 MG tablet Take 81 mg by mouth daily.    . Calcium  Carb-Cholecalciferol [phone] MG-UNIT CAPS Take 1 tablet by mouth daily.    . calcium  carbonate (TUMS - DOSED IN MG ELEMENTAL CALCIUM ) 500 MG chewable tablet Chew 1 tablet by mouth daily as needed for indigestion.    . chlorpheniramine (CHLOR-TRIMETON) 4 MG tablet Take 4 mg by mouth daily as needed for allergies.    . Evolocumab  (REPATHA  SURECLICK) 140 MG/ML SOAJ Inject 140 mg into the skin every 14 (fourteen) days. 6 mL  3  . famotidine  (PEPCID ) 10 MG tablet Take 1 tablet (10 mg total) by mouth daily. 90 tablet 3  . fexofenadine  (ALLEGRA ) 180 MG tablet Take 180 mg by mouth daily as needed for allergies.    SABRA FOLIC ACID PO Take 1 tablet by mouth daily.    SABRA GLYCERIN-HYPROMELLOSE-PEG 400 OP Place 1 drop into both eyes daily as needed (Dry eyes).    . hydrochlorothiazide  (HYDRODIURIL ) 25 MG tablet Take 1 tablet daily by mouth 90 tablet 3  . levothyroxine  (SYNTHROID ) 100 MCG tablet Take 1 tablet (100 mcg total) by mouth daily before breakfast. 90 tablet 3  . lisinopril  (ZESTRIL ) 10 MG tablet TAKE 1 TABLET(10 MG) BY MOUTH DAILY 90 tablet 3  . meclizine  (ANTIVERT ) 25 MG tablet Take 1 tablet (25 mg total) by mouth 3 (three) times daily as needed for dizziness.  30 tablet 3  . Multiple Vitamins-Minerals (OCUVITE PRESERVISION PO) Take 1 tablet by mouth daily.    . polyethylene glycol powder (GLYCOLAX /MIRALAX ) 17 GM/SCOOP powder Take 17 g by mouth daily. 500 g 3  . Polyvinyl Alcohol-Povidone (REFRESH OP) Apply 1 drop to eye as needed.    . RESTASIS 0.05 % ophthalmic emulsion Place 1 drop into both eyes 2 (two) times daily.    . vitamin C (ASCORBIC ACID) 250 MG tablet Take 250 mg by mouth daily.    SABRA zinc gluconate 50 MG tablet Take 1 tablet by mouth daily.     No current facility-administered medications for this visit.   Allergies as of 03/21/2023 - Review Complete 03/21/2023  Allergen Reaction Noted  . Statins  01/12/2016  . Atorvastatin  Other (See Comments) 06/02/2015  . Erythromycin  07/08/2013  . Furosemide   06/02/2015  . Hydrocodone-acetaminophen  Itching and Nausea And Vomiting 07/15/2013  . Meloxicam  09/21/2013  . Metoprolol Nausea And Vomiting 06/02/2015  . Montelukast  06/29/2014  . Omeprazole  06/02/2015  . Potassium chloride  06/02/2015  . Rosuvastatin   05/23/2020   Family History  Problem Relation Age of Onset  . Other Mother        killed in gas explosion  . Asthma Mother   . Heart murmur  Father   . Arthritis Father   . Hyperlipidemia Father   . GER disease Father   . Colon polyps Father   . Arthritis Brother   . Sinusitis Brother   . Diabetes Paternal Grandmother   . Rheumatic fever Paternal Grandmother   . Breast cancer Other   . Heart disease Neg Hx    Review of Systems:    Constitutional: No weight loss, fever, chills, weakness or fatigue HEENT: Eyes: No change in vision               Ears, Nose, Throat:  No change in hearing or congestion Skin: No rash or itching Cardiovascular: No chest pain, chest pressure or palpitations   Respiratory: No SOB or cough Gastrointestinal: See HPI and otherwise negative Genitourinary: No dysuria or change in urinary frequency Neurological: No headache, dizziness or syncope Musculoskeletal: No new muscle or joint pain Hematologic: No bleeding or bruising Psychiatric: No history of depression or anxiety   Physical Exam:  Vital signs: BP (!) 142/80 (BP Location: Left Arm, Patient Position: Sitting, Cuff Size: Normal)   Pulse 96   Ht 4' 7 (1.397 m)   Wt 170 lb 4 oz (77.2 kg)   LMP  (LMP Unknown)   BMI 39.57 kg/m   Constitutional: Pleasant Caucasian female appears to be in NAD, Well developed, Well nourished, alert and cooperative. Neck:  Supple Throat: Oral cavity and pharynx without inflammation, swelling or lesion.  Respiratory: Respirations even and unlabored. Lungs clear to auscultation bilaterally.   No wheezes, crackles, or rhonchi.  Cardiovascular: Normal S1, S2. Regular rate and rhythm. +1 bilateral edema to lower extremities  Gastrointestinal:  Soft, nondistended, obese, nontender. No rebound or guarding. Hypoactive bowel sounds. No appreciable masses or hepatomegaly. Rectal:  Not performed.  Msk:  Symmetrical without gross deformities. Without edema, no deformity or joint abnormality.  Neurologic:  Alert and  oriented x4;  grossly normal neurologically. Uses sign language to communicate. Skin:   Dry and intact  without significant lesions or rashes. Psychiatric: Oriented to person, place and time. Demonstrates good judgement and reason without abnormal affect or behaviors.  RELEVANT LABS AND IMAGING: CBC    Latest Ref Rng &  Units 08/13/2022   11:56 AM 02/11/2021    2:28 AM 02/06/2021    3:34 AM  CBC  WBC 3.4 - 10.8 x10E3/uL 5.4  8.5  6.2   Hemoglobin 11.1 - 15.9 g/dL 86.2  86.4  86.3   Hematocrit 34.0 - 46.6 % 40.4  40.5  41.6   Platelets 150 - 450 x10E3/uL 245  238  217     CMP     Latest Ref Rng & Units 08/13/2022   11:56 AM 02/11/2021    2:28 AM 02/06/2021    3:34 AM  CMP  Glucose 70 - 99 mg/dL 81  877  884   BUN 8 - 27 mg/dL 23  31  20    Creatinine 0.57 - 1.00 mg/dL 9.22  9.27  9.27   Sodium 134 - 144 mmol/L 140  140  139   Potassium 3.5 - 5.2 mmol/L 4.5  3.4  3.7   Chloride 96 - 106 mmol/L 98  105  103   CO2 20 - 29 mmol/L 26  27  28    Calcium  8.7 - 10.3 mg/dL 89.0  9.1  9.4   Total Protein 6.0 - 8.5 g/dL 6.9   6.2   Total Bilirubin 0.0 - 1.2 mg/dL 0.4   0.4   Alkaline Phos 44 - 121 IU/L 96   61   AST 0 - 40 IU/L 23   26   ALT 0 - 32 IU/L 24   27     Lab Results  Component Value Date   TSH 1.080 08/13/2022    04/05/2022 CT Angio Chest aorta IMPRESSION: 1. Stable changes of coarctation of the aorta status post repair. No evidence for aneurysm or dissection. 2. Aortic valve replacement. 3. No acute cardiopulmonary process. 4. Stable benign 2 mm left lower lobe pulmonary nodule.  09/16/2022 echo- Left ventricular ejection fraction, by estimation, is 55 to 60%.   01/11/2014 colonoscopy at [facility] -The colon appeared to be normal.  Colonoscopy recommended in 10 years.  Assessment: Encounter Diagnoses  Name Primary?  . Chronic constipation Yes  . Special screening for malignant neoplasms, colon   . Pyrosis   68 year old female patient that presents to schedule colon screening  colonoscopy. Last colonoscopy 12/2013 and normal. She has history of  intermittent chronic constipation that has resolved with adding OTC Miralax  and following a high fiber diet. Occasional pyrosis that is managed with famotidine  scheduled twice daily.  Plan: -Continue Pepcid  BID po daily -Reinforced incorporating fiber into diet daily  -Reinforced continuing Miralax  po daily  -Schedule Colonoscopy with [facility]  in [facility]  -I thoroughly discussed the procedure to include nature, alternatives, benefits, and risks including but not limited to bleeding, perforation, infection, anesthesia/cardiac and pulmonary complications. Patient provides understanding and gave verbal consent to proceed.  Deanna May, FNP-C [facility] 03/21/2023, 2:34 PM  The APP and I saw this patient together, with the APP acting as my scribe.  I have reviewed the above documentation for accuracy and completeness and I agree with the above documentation. Intermittent reflux symptoms under good control with low-dose medicine, diet and lifestyle changes and no red flag signs.  Constipation improved with MiraLAX .  Recommended high-fiber diet as well.  Average risk for colorectal cancer screening.  Colonoscopy scheduled and she was agreeable after discussion of procedure and risks.  Cardiovascular condition stable, meets criteria for procedure in our outpatient endoscopy lab. Reedy Biernat L. Legrand, MD   Cc: Dameron, [name], DO

## 2023-03-31 ENCOUNTER — Other Ambulatory Visit: Payer: Self-pay | Admitting: Family Medicine

## 2023-03-31 DIAGNOSIS — Z1231 Encounter for screening mammogram for malignant neoplasm of breast: Secondary | ICD-10-CM

## 2023-05-01 ENCOUNTER — Encounter: Payer: Medicare HMO | Admitting: Gastroenterology

## 2023-05-23 ENCOUNTER — Other Ambulatory Visit: Payer: Self-pay

## 2023-05-23 DIAGNOSIS — I5032 Chronic diastolic (congestive) heart failure: Secondary | ICD-10-CM

## 2023-05-23 DIAGNOSIS — E785 Hyperlipidemia, unspecified: Secondary | ICD-10-CM

## 2023-05-23 DIAGNOSIS — E039 Hypothyroidism, unspecified: Secondary | ICD-10-CM

## 2023-05-23 DIAGNOSIS — Z8774 Personal history of (corrected) congenital malformations of heart and circulatory system: Secondary | ICD-10-CM

## 2023-05-23 DIAGNOSIS — I10 Essential (primary) hypertension: Secondary | ICD-10-CM

## 2023-05-23 DIAGNOSIS — Z952 Presence of prosthetic heart valve: Secondary | ICD-10-CM

## 2023-05-23 DIAGNOSIS — Q2381 Bicuspid aortic valve: Secondary | ICD-10-CM

## 2023-05-23 MED ORDER — HYDROCHLOROTHIAZIDE 25 MG PO TABS: ORAL_TABLET | ORAL | 0 refills | Status: DC

## 2023-05-23 NOTE — Telephone Encounter (Signed)
 Medication ideally needs to be refilled by PCP.

## 2023-05-23 NOTE — Telephone Encounter (Signed)
 Pt has an upcoming appt with Dr. Cristal Deer on 08/12/23. Pt's pharmacy is requesting a refill on medication levothyroxine. Would Dr. Cristal Deer like to refill this non cardiac medication? Please address

## 2023-05-30 ENCOUNTER — Ambulatory Visit
Admission: RE | Admit: 2023-05-30 | Discharge: 2023-05-30 | Disposition: A | Discharge status: Home / Self Care | Payer: Medicare HMO | Source: Ambulatory Visit | Attending: Family Medicine | Admitting: Family Medicine

## 2023-05-30 DIAGNOSIS — Z1231 Encounter for screening mammogram for malignant neoplasm of breast: Secondary | ICD-10-CM

## 2023-06-19 ENCOUNTER — Other Ambulatory Visit: Payer: Self-pay | Admitting: Pharmacist Clinician (PhC)/ Clinical Pharmacy Specialist

## 2023-06-19 MED ORDER — REPATHA SURECLICK 140 MG/ML ~~LOC~~ SOAJ: 1.0000 | SUBCUTANEOUS | 3 refills | Status: AC

## 2023-06-24 ENCOUNTER — Telehealth: Payer: Self-pay | Admitting: Pharmacy Technician

## 2023-06-24 NOTE — Telephone Encounter (Signed)
 Pharmacy Patient Advocate Encounter   Received notification from CoverMyMeds that prior authorization for REPATHA is required/requested.   Insurance verification completed.   The patient is insured through [facility] .   Per test claim: PA required; PA submitted to above mentioned insurance via CoverMyMeds Key/confirmation #/EOC [id number] Status is pending

## 2023-06-25 NOTE — Telephone Encounter (Signed)
 Pharmacy Patient Advocate Encounter  Received notification from [facility] that Prior Authorization for repatha has been APPROVED from 06/24/23 to 12/24/23   PA #/Case ID/Reference #: [id number]

## 2023-07-01 ENCOUNTER — Telehealth: Payer: Self-pay

## 2023-07-01 NOTE — Telephone Encounter (Signed)
 Patient LVM on nurse line regarding concerns with Moderna COVID vaccine. She has concerns about "warnings about this affecting my health."   Per chart review, patient had Moderna booster 12/21/2020.  Attempted to call patient back to discuss concern further. She did not answer, LVM asking that she return call to office.   Elsie Halo, RN

## 2023-07-01 NOTE — Telephone Encounter (Signed)
 Received returned call from patient. Reports that she read an article that if you received a Moderna shot five years ago, you should call your doctor regarding possible serious side effects.   She reports that there was some of the vaccines that could cause ongoing issues that could become deadly later in life.   She is not having any symptoms. She just wanted to follow up with PCP as instructed by article.   Elsie Halo, RN

## 2023-08-12 ENCOUNTER — Ambulatory Visit (HOSPITAL_BASED_OUTPATIENT_CLINIC_OR_DEPARTMENT_OTHER): Payer: Medicare HMO | Admitting: Cardiology

## 2023-08-12 NOTE — Progress Notes (Incomplete)
 Cardiology Office Note:  .   Date:  08/12/2023  ID:  Ashley Torres, DOB Aug 29, 1955, MRN [id number] PCP: Ashley Gayer, DO  [facility] Providers Cardiologist:  None {  History of Present Illness: Ashley Torres   Ashley Torres is a 68 y.o. female with PMH congenital deafness, bicuspid aortic valve s/p AVR 2017, coarctation of the aorta s/p repair as a child, hypertension, hyperlipidemia, chronic LE edema. She was previously followed by Dr. Nicholette Torres and Dr. Ardell Torres and established care with me on 08/12/23.  Pertinent CV history: Coarctation repair around age 87. Found to have severe bicuspid AS in 2016 at [facility] . Cath with normal cors. Underwent AVR with annulus enlargement with 19 mm Magna CE bovine pericardial valve in 2017 at [facility] . Echo gradients of the valve note 21 mm mean gradient in 2019, most recent echo in 2024 with gradient 20 mmHg. There has persistently been elevated velocities in the descending aorta on echo, concerning for residual stenosis at coarctation repair. Most recent CTA 03/2022 was stable, with distal aorta measuring 11 mm.  Has history of statin intolerance and ezetimibe intolerance, on repatha .  Today:  ROS: Denies chest pain, shortness of breath at rest or with normal exertion. No PND, orthopnea, LE edema or unexpected weight gain. No syncope or palpitations. ROS otherwise negative except as noted.   Studies Reviewed: Ashley Torres    EKG:       Physical Exam:   VS:  LMP  (LMP Unknown)    Wt Readings from Last 3 Encounters:  03/21/23 170 lb 4 oz (77.2 kg)  10/28/22 164 lb (74.4 kg)  09/11/22 165 lb 9.6 oz (75.1 kg)    GEN: Well nourished, well developed in no acute distress HEENT: Normal, moist mucous membranes NECK: No JVD CARDIAC: regular rhythm, normal S1 and S2, no rubs or gallops. No murmur. VASCULAR: Radial and DP pulses 2+ bilaterally. No carotid bruits RESPIRATORY:  Clear to auscultation without rales, wheezing or rhonchi  ABDOMEN: Soft, non-tender,  non-distended MUSCULOSKELETAL:  Ambulates independently SKIN: Warm and dry, no edema NEUROLOGIC:  Alert and oriented x 3. No focal neuro deficits noted. PSYCHIATRIC:  Normal affect    ASSESSMENT AND PLAN: .    Coarctation of the aorta s/p repair in childhood -CTA q2 years for monitoring given elevated velocities by echo  Severe aortic stenosis 2/2 bicuspid aortic valve S/P AVR 2017 with 19 mm Magna CE bovine pericardial valve and root enlargement -continue echo monitoring yearly -mean gradient persistently low 20s mmHg -continue SBE prophylaxis -continue aspirin  81 mg daily  Chronic LE edema -echo with only G1DD -continue hydrochlorothiazide , has intolerance to lasix   Hypertension  Hyperlipidemia -intolerant to statins and ezetimibe -continue repatha   CV risk counseling and prevention -recommend heart healthy/Mediterranean diet, with whole grains, fruits, vegetable, fish, lean meats, nuts, and olive oil. Limit salt. -recommend moderate walking, 3-5 times/week for 30-50 minutes each session. Aim for at least 150 minutes.week. Goal should be pace of 3 miles/hours, or walking 1.5 miles in 30 minutes -recommend avoidance of tobacco products. Avoid excess alcohol. -ASCVD risk score: The 10-year ASCVD risk score (Arnett DK, et al., 2019) is: 8%   Values used to calculate the score:     Age: 89 years     Sex: Female     Is Non-Hispanic African American: No     Diabetic: No     Tobacco smoker: No     Systolic Blood Pressure: 142 mmHg     Is BP treated: Yes  HDL Cholesterol: 53 mg/dL     Total Cholesterol: 152 mg/dL    Dispo: ***  Signed, Ashley Donning, MD   Ashley Donning, MD, PhD, [facility] [place]  [facility]  [place]  [facility] & Vascular at [facility] at [facility] [address] [place], [place] [address] [phone]

## 2023-08-18 ENCOUNTER — Other Ambulatory Visit: Payer: Self-pay | Admitting: Cardiology

## 2023-08-18 DIAGNOSIS — E785 Hyperlipidemia, unspecified: Secondary | ICD-10-CM

## 2023-08-18 DIAGNOSIS — Z8774 Personal history of (corrected) congenital malformations of heart and circulatory system: Secondary | ICD-10-CM

## 2023-08-18 DIAGNOSIS — Z952 Presence of prosthetic heart valve: Secondary | ICD-10-CM

## 2023-08-18 DIAGNOSIS — I10 Essential (primary) hypertension: Secondary | ICD-10-CM

## 2023-08-18 DIAGNOSIS — I5032 Chronic diastolic (congestive) heart failure: Secondary | ICD-10-CM

## 2023-08-18 DIAGNOSIS — Q2381 Bicuspid aortic valve: Secondary | ICD-10-CM

## 2023-08-18 DIAGNOSIS — E039 Hypothyroidism, unspecified: Secondary | ICD-10-CM

## 2023-08-20 ENCOUNTER — Other Ambulatory Visit: Payer: Self-pay

## 2023-08-20 DIAGNOSIS — E039 Hypothyroidism, unspecified: Secondary | ICD-10-CM

## 2023-08-20 DIAGNOSIS — Q2381 Bicuspid aortic valve: Secondary | ICD-10-CM

## 2023-08-20 DIAGNOSIS — I10 Essential (primary) hypertension: Secondary | ICD-10-CM

## 2023-08-20 DIAGNOSIS — E785 Hyperlipidemia, unspecified: Secondary | ICD-10-CM

## 2023-08-20 DIAGNOSIS — Z952 Presence of prosthetic heart valve: Secondary | ICD-10-CM

## 2023-08-20 DIAGNOSIS — Z8774 Personal history of (corrected) congenital malformations of heart and circulatory system: Secondary | ICD-10-CM

## 2023-08-20 DIAGNOSIS — I5032 Chronic diastolic (congestive) heart failure: Secondary | ICD-10-CM

## 2023-08-21 ENCOUNTER — Other Ambulatory Visit: Payer: Self-pay

## 2023-08-21 DIAGNOSIS — I5032 Chronic diastolic (congestive) heart failure: Secondary | ICD-10-CM

## 2023-08-21 DIAGNOSIS — E039 Hypothyroidism, unspecified: Secondary | ICD-10-CM

## 2023-08-21 DIAGNOSIS — I10 Essential (primary) hypertension: Secondary | ICD-10-CM

## 2023-08-21 DIAGNOSIS — E785 Hyperlipidemia, unspecified: Secondary | ICD-10-CM

## 2023-08-21 DIAGNOSIS — Q2381 Bicuspid aortic valve: Secondary | ICD-10-CM

## 2023-08-21 DIAGNOSIS — Z952 Presence of prosthetic heart valve: Secondary | ICD-10-CM

## 2023-08-21 DIAGNOSIS — Z8774 Personal history of (corrected) congenital malformations of heart and circulatory system: Secondary | ICD-10-CM

## 2023-08-21 MED ORDER — LISINOPRIL 10 MG PO TABS: ORAL_TABLET | ORAL | 0 refills | Status: DC

## 2023-08-25 ENCOUNTER — Telehealth: Payer: Self-pay

## 2023-08-25 NOTE — Telephone Encounter (Signed)
 Patient calls nurse line requesting refills on (all) of her medications.   Patient is requesting a (years worth) of medications.   Patient advised she will need an apt to discuss.   Patient scheduled with PCP.

## 2023-08-26 ENCOUNTER — Other Ambulatory Visit: Payer: Self-pay | Admitting: *Deleted

## 2023-08-26 ENCOUNTER — Telehealth (HOSPITAL_BASED_OUTPATIENT_CLINIC_OR_DEPARTMENT_OTHER): Payer: Self-pay | Admitting: Cardiology

## 2023-08-26 DIAGNOSIS — Z8774 Personal history of (corrected) congenital malformations of heart and circulatory system: Secondary | ICD-10-CM

## 2023-08-26 DIAGNOSIS — Q2381 Bicuspid aortic valve: Secondary | ICD-10-CM

## 2023-08-26 DIAGNOSIS — E039 Hypothyroidism, unspecified: Secondary | ICD-10-CM

## 2023-08-26 DIAGNOSIS — I10 Essential (primary) hypertension: Secondary | ICD-10-CM

## 2023-08-26 DIAGNOSIS — E785 Hyperlipidemia, unspecified: Secondary | ICD-10-CM

## 2023-08-26 DIAGNOSIS — Z952 Presence of prosthetic heart valve: Secondary | ICD-10-CM

## 2023-08-26 DIAGNOSIS — I5032 Chronic diastolic (congestive) heart failure: Secondary | ICD-10-CM

## 2023-08-26 NOTE — Telephone Encounter (Signed)
 Pt c/o medication issue:  1. Name of Medication: levothyroxine  (SYNTHROID ) 100 MCG tablet   2. How are you currently taking this medication (dosage and times per day)? As prescribed   3. Are you having a reaction (difficulty breathing--STAT)? No   4. What is your medication issue? Patient is calling stating pharmacy advised she needs PA to get refill and she is down to her last 3 tablets. Please advise.

## 2023-08-26 NOTE — Telephone Encounter (Signed)
 Levo previously sent in by [facility]- would you like to continue owning this or should it go to PCP?

## 2023-08-28 ENCOUNTER — Other Ambulatory Visit: Payer: Self-pay

## 2023-08-28 DIAGNOSIS — I10 Essential (primary) hypertension: Secondary | ICD-10-CM

## 2023-08-28 DIAGNOSIS — Z952 Presence of prosthetic heart valve: Secondary | ICD-10-CM

## 2023-08-28 DIAGNOSIS — Q2381 Bicuspid aortic valve: Secondary | ICD-10-CM

## 2023-08-28 DIAGNOSIS — I5032 Chronic diastolic (congestive) heart failure: Secondary | ICD-10-CM

## 2023-08-28 DIAGNOSIS — E039 Hypothyroidism, unspecified: Secondary | ICD-10-CM

## 2023-08-28 DIAGNOSIS — Z8774 Personal history of (corrected) congenital malformations of heart and circulatory system: Secondary | ICD-10-CM

## 2023-08-28 DIAGNOSIS — E785 Hyperlipidemia, unspecified: Secondary | ICD-10-CM

## 2023-08-28 NOTE — Telephone Encounter (Signed)
 Returned call to pt- reviewed with patient that Synthroid  prescription will need to come from primary care.

## 2023-08-29 MED ORDER — LEVOTHYROXINE SODIUM 100 MCG PO TABS: 100.0000 ug | ORAL_TABLET | Freq: Every day | ORAL | 3 refills | Status: DC

## 2023-09-02 ENCOUNTER — Ambulatory Visit (INDEPENDENT_AMBULATORY_CARE_PROVIDER_SITE_OTHER): Admitting: Student

## 2023-09-02 VITALS — BP 126/80 | HR 85 | Wt 167.4 lb

## 2023-09-02 DIAGNOSIS — Z1211 Encounter for screening for malignant neoplasm of colon: Secondary | ICD-10-CM | POA: Diagnosis not present

## 2023-09-02 DIAGNOSIS — Z952 Presence of prosthetic heart valve: Secondary | ICD-10-CM

## 2023-09-02 DIAGNOSIS — Q2381 Bicuspid aortic valve: Secondary | ICD-10-CM

## 2023-09-02 DIAGNOSIS — Z8774 Personal history of (corrected) congenital malformations of heart and circulatory system: Secondary | ICD-10-CM

## 2023-09-02 DIAGNOSIS — E785 Hyperlipidemia, unspecified: Secondary | ICD-10-CM | POA: Diagnosis not present

## 2023-09-02 DIAGNOSIS — E039 Hypothyroidism, unspecified: Secondary | ICD-10-CM | POA: Diagnosis not present

## 2023-09-02 DIAGNOSIS — I10 Essential (primary) hypertension: Secondary | ICD-10-CM | POA: Diagnosis not present

## 2023-09-02 DIAGNOSIS — I5032 Chronic diastolic (congestive) heart failure: Secondary | ICD-10-CM

## 2023-09-02 LAB — POCT GLYCOSYLATED HEMOGLOBIN (HGB A1C): Hemoglobin A1C: 5.5 % (ref 4.0–5.6)

## 2023-09-02 MED ORDER — MECLIZINE HCL 25 MG PO TABS: 25.0000 mg | ORAL_TABLET | Freq: Three times a day (TID) | ORAL | 3 refills | Status: AC | PRN

## 2023-09-02 MED ORDER — FAMOTIDINE 10 MG PO TABS: 10.0000 mg | ORAL_TABLET | Freq: Every day | ORAL | 3 refills | Status: AC

## 2023-09-02 MED ORDER — POLYETHYLENE GLYCOL 3350 17 GM/SCOOP PO POWD: 17.0000 g | Freq: Every day | ORAL | 3 refills | Status: AC

## 2023-09-02 MED ORDER — LISINOPRIL 10 MG PO TABS: ORAL_TABLET | ORAL | 3 refills | Status: DC

## 2023-09-02 MED ORDER — LEVOTHYROXINE SODIUM 100 MCG PO TABS: 100.0000 ug | ORAL_TABLET | Freq: Every day | ORAL | 3 refills | Status: AC

## 2023-09-02 MED ORDER — ALBUTEROL SULFATE HFA 108 (90 BASE) MCG/ACT IN AERS: 2.0000 | INHALATION_SPRAY | RESPIRATORY_TRACT | 0 refills | Status: DC | PRN

## 2023-09-02 MED ORDER — FEXOFENADINE HCL 180 MG PO TABS: 180.0000 mg | ORAL_TABLET | Freq: Every day | ORAL | 3 refills | Status: AC | PRN

## 2023-09-02 NOTE — Progress Notes (Signed)
    SUBJECTIVE:   Chief compliant/HPI: annual examination  Ashley Torres is a 68 y.o. who presents today for an annual exam.   Doing well overall- has been taking some Jewish ancestry courses which she is enjoying.  She tried to get her colonoscopy done, but does not have anyone to go as her support person.  History tabs reviewed and updated.   Review of systems form reviewed and notable for negative unless indicated above.   OBJECTIVE:   BP 126/80   Pulse 85   Wt 167 lb 6.4 oz (75.9 kg)   LMP  (LMP Unknown)   SpO2 94%   BMI 38.91 kg/m   General: NAD, well appearing, ASL speaking HEENT: Wears corrective lenses. Cardiac: RRR Neuro: A&O Respiratory: normal WOB on RA. No wheezing or crackles on auscultation, good lung sounds throughout Extremities: Moving all 4 extremities equally   ASSESSMENT/PLAN:   Assessment & Plan Essential hypertension Well controlled Refilled lisinopril , amlodipine Hyperlipidemia, unspecified hyperlipidemia type Lipid panel today Hypothyroidism (acquired) TSH today Colon cancer screening Discussed risks/benefits of Colaguard, patient prefers to complete this method of screening. No contraindications.  Annual Examination  See AVS for age appropriate recommendations.   Considered the following screening exams based upon USPSTF recommendations: Declined DEXA today, get at next visit. Diabetes screening: discussed and ordered  Follow up in 1 year or sooner if indicated.  MyChart Activation: Already signed up   Vallorie Gayer, DO [facility]

## 2023-09-02 NOTE — Addendum Note (Signed)
 Addended by: Angelita Kendall on: 09/02/2023 11:21 AM   Modules accepted: Orders

## 2023-09-02 NOTE — Assessment & Plan Note (Signed)
 Lipid panel today

## 2023-09-02 NOTE — Assessment & Plan Note (Signed)
 TSH today

## 2023-09-02 NOTE — Patient Instructions (Addendum)
 It was great seeing you today.  As we discussed, - Lab work today - Medicines refilled - Colaguard test ordered for colon cancer screening   If you have any questions or concerns, please feel free to call the clinic.   Have a wonderful day,  Dr. Vallorie Gayer [facility] [phone]

## 2023-09-03 LAB — CBC WITH DIFFERENTIAL/PLATELET
Basophils Absolute: 0 10*3/uL (ref 0.0–0.2)
Basos: 0 %
EOS (ABSOLUTE): 0.2 10*3/uL (ref 0.0–0.4)
Eos: 4 %
Hematocrit: 42.8 % (ref 34.0–46.6)
Hemoglobin: 14.1 g/dL (ref 11.1–15.9)
Immature Grans (Abs): 0 10*3/uL (ref 0.0–0.1)
Immature Granulocytes: 0 %
Lymphocytes Absolute: 1.1 10*3/uL (ref 0.7–3.1)
Lymphs: 25 %
MCH: 29.6 pg (ref 26.6–33.0)
MCHC: 32.9 g/dL (ref 31.5–35.7)
MCV: 90 fL (ref 79–97)
Monocytes Absolute: 0.5 10*3/uL (ref 0.1–0.9)
Monocytes: 11 %
Neutrophils Absolute: 2.7 10*3/uL (ref 1.4–7.0)
Neutrophils: 60 %
Platelets: 246 10*3/uL (ref 150–450)
RBC: 4.76 x10E6/uL (ref 3.77–5.28)
RDW: 13 % (ref 11.7–15.4)
WBC: 4.5 10*3/uL (ref 3.4–10.8)

## 2023-09-03 LAB — LIPID PANEL
Chol/HDL Ratio: 2.7 ratio (ref 0.0–4.4)
Cholesterol, Total: 142 mg/dL (ref 100–199)
HDL: 53 mg/dL (ref >39)
LDL Chol Calc (NIH): 60 mg/dL (ref 0–99)
Triglycerides: 172 mg/dL — ABNORMAL HIGH (ref 0–149)
VLDL Cholesterol Cal: 29 mg/dL (ref 5–40)

## 2023-09-03 LAB — TSH: TSH: 0.73 u[IU]/mL (ref 0.450–4.500)

## 2023-09-17 ENCOUNTER — Other Ambulatory Visit: Payer: Self-pay | Admitting: Student

## 2023-09-17 DIAGNOSIS — Z952 Presence of prosthetic heart valve: Secondary | ICD-10-CM

## 2023-09-17 DIAGNOSIS — I5032 Chronic diastolic (congestive) heart failure: Secondary | ICD-10-CM

## 2023-09-17 DIAGNOSIS — Q2381 Bicuspid aortic valve: Secondary | ICD-10-CM

## 2023-09-17 DIAGNOSIS — Z8774 Personal history of (corrected) congenital malformations of heart and circulatory system: Secondary | ICD-10-CM

## 2023-09-17 DIAGNOSIS — E039 Hypothyroidism, unspecified: Secondary | ICD-10-CM

## 2023-09-17 DIAGNOSIS — E785 Hyperlipidemia, unspecified: Secondary | ICD-10-CM

## 2023-09-17 DIAGNOSIS — I10 Essential (primary) hypertension: Secondary | ICD-10-CM

## 2023-09-24 ENCOUNTER — Ambulatory Visit: Payer: Self-pay | Admitting: Family Medicine

## 2023-09-24 LAB — COLOGUARD: COLOGUARD: NEGATIVE

## 2023-09-30 ENCOUNTER — Telehealth: Payer: Self-pay

## 2023-09-30 NOTE — Telephone Encounter (Signed)
 Patient calls nurse line in regards to lab results.  She reports she was seen in June, however no one has contacted her.   Advised of negative Cologuard test.   Will send to new PCP to review lab work.

## 2023-10-02 NOTE — Telephone Encounter (Signed)
 Called patient with sign language interpreter.   Patient did not answer. Detailed VM was left of results. Advised to continue lifestyle changes.   Call back with questions.

## 2023-10-23 ENCOUNTER — Telehealth: Payer: Self-pay | Admitting: Cardiology

## 2023-10-23 MED ORDER — AMOXICILLIN 500 MG PO CAPS: 2000.0000 mg | ORAL_CAPSULE | Freq: Once | ORAL | 3 refills | Status: AC | PRN

## 2023-10-23 NOTE — Telephone Encounter (Signed)
 Per office visit notes 08/13/22 #History of Bicuspid Aortic Valve S/p AVR in 2017: Underwent aortic valve replacement with annulus enlargement (19 mm Magna model 300 Carpentier-Edwards bovine pericardial heart valve) on 04/17/15 at [facility]. Pre-op cath without significant disease. TTEs with elevated gradients but have been relatively stable with last mean gradient . Currently, doing well with no chest pain, SOB, lightheadedness or dizziness.  -Continue yearly echoes for monitoring -Continue SBE ppx -Continue ASA 81mg  daily  Order sent for Amoxicillin  2gm to be taken 1-2 hours prior to dental procedures.(SBE prophylaxis)

## 2023-10-23 NOTE — Telephone Encounter (Signed)
 This Pt is requesting a refill. This RX isn't a Cardiac RX and isn't on current med list. Please advise.

## 2023-10-23 NOTE — Telephone Encounter (Signed)
*  STAT* If patient is at the pharmacy, call can be transferred to refill team.   1. Which medications need to be refilled? (please list name of each medication and dose if known) Amoxicillin  500mg  tablets  2. Which pharmacy/location (including street and city if local pharmacy) is medication to be sent to? [id number] - [place],  - [address] AT [facility] OF [address]    3. Do they need a 30 day or 90 day supply? Qty:4

## 2023-11-07 ENCOUNTER — Other Ambulatory Visit: Payer: Self-pay | Admitting: Cardiology

## 2023-11-07 DIAGNOSIS — E785 Hyperlipidemia, unspecified: Secondary | ICD-10-CM

## 2023-11-07 DIAGNOSIS — Q2381 Bicuspid aortic valve: Secondary | ICD-10-CM

## 2023-11-07 DIAGNOSIS — I10 Essential (primary) hypertension: Secondary | ICD-10-CM

## 2023-11-07 DIAGNOSIS — Z952 Presence of prosthetic heart valve: Secondary | ICD-10-CM

## 2023-11-07 DIAGNOSIS — E039 Hypothyroidism, unspecified: Secondary | ICD-10-CM

## 2023-11-07 DIAGNOSIS — Z8774 Personal history of (corrected) congenital malformations of heart and circulatory system: Secondary | ICD-10-CM

## 2023-11-07 DIAGNOSIS — I5032 Chronic diastolic (congestive) heart failure: Secondary | ICD-10-CM

## 2023-11-10 ENCOUNTER — Telehealth: Payer: Self-pay | Admitting: Cardiology

## 2023-11-10 DIAGNOSIS — Q2381 Bicuspid aortic valve: Secondary | ICD-10-CM

## 2023-11-10 DIAGNOSIS — Z8774 Personal history of (corrected) congenital malformations of heart and circulatory system: Secondary | ICD-10-CM

## 2023-11-10 DIAGNOSIS — I5032 Chronic diastolic (congestive) heart failure: Secondary | ICD-10-CM

## 2023-11-10 DIAGNOSIS — E039 Hypothyroidism, unspecified: Secondary | ICD-10-CM

## 2023-11-10 DIAGNOSIS — I10 Essential (primary) hypertension: Secondary | ICD-10-CM

## 2023-11-10 DIAGNOSIS — Z952 Presence of prosthetic heart valve: Secondary | ICD-10-CM

## 2023-11-10 DIAGNOSIS — E785 Hyperlipidemia, unspecified: Secondary | ICD-10-CM

## 2023-11-11 ENCOUNTER — Other Ambulatory Visit: Payer: Self-pay | Admitting: Cardiology

## 2023-11-11 DIAGNOSIS — I5032 Chronic diastolic (congestive) heart failure: Secondary | ICD-10-CM

## 2023-11-11 DIAGNOSIS — E039 Hypothyroidism, unspecified: Secondary | ICD-10-CM

## 2023-11-11 DIAGNOSIS — I10 Essential (primary) hypertension: Secondary | ICD-10-CM

## 2023-11-11 DIAGNOSIS — Z8774 Personal history of (corrected) congenital malformations of heart and circulatory system: Secondary | ICD-10-CM

## 2023-11-11 DIAGNOSIS — E785 Hyperlipidemia, unspecified: Secondary | ICD-10-CM

## 2023-11-11 DIAGNOSIS — Z952 Presence of prosthetic heart valve: Secondary | ICD-10-CM

## 2023-11-11 DIAGNOSIS — Q2381 Bicuspid aortic valve: Secondary | ICD-10-CM

## 2023-11-11 NOTE — Telephone Encounter (Signed)
 Pt's medication was sent to pt's pharmacy as requested. Confirmation received.

## 2023-11-11 NOTE — Telephone Encounter (Signed)
*  STAT* If patient is at the pharmacy, call can be transferred to refill team.   1. Which medications need to be refilled? (please list name of each medication and dose if known)   hydrochlorothiazide  (HYDRODIURIL ) 25 MG tablet   2. Would you like to learn more about the convenience, safety, & potential cost savings by using the [facility]?   3. Are you open to using the [facility] (Type [facility]. ).  4. Which pharmacy/location (including street and city if local pharmacy) is medication to be sent to?  [id number] - , [place] - [address] AT [facility] OF [address]   5. Do they need a 30 day or 90 day supply?   Patient stated she is completely out of this medication and has an appointment scheduled on 9/5 with Dr. Lonni.

## 2023-11-12 ENCOUNTER — Telehealth (HOSPITAL_BASED_OUTPATIENT_CLINIC_OR_DEPARTMENT_OTHER): Payer: Self-pay | Admitting: Cardiology

## 2023-11-12 NOTE — Telephone Encounter (Signed)
*  STAT* If patient is at the pharmacy, call can be transferred to refill team.   1. Which medications need to be refilled? (please list name of each medication and dose if known) hydrochlorothiazide  (HYDRODIURIL ) 25 MG tablet    2. Would you like to learn more about the convenience, safety, & potential cost savings by using the [facility]? NO   3. Are you open to using the [facility] NO   4. Which pharmacy/location (including street and city if local pharmacy) is medication to be sent to?  [id number] - [place], [place] - [address] AT [facility] OF [address]     5. Do they need a 30 day or 90 day supply? Supply to get her to her appt on 09/05 with Dr. Lonni. Patient has appt scheduled.

## 2023-11-21 ENCOUNTER — Ambulatory Visit (INDEPENDENT_AMBULATORY_CARE_PROVIDER_SITE_OTHER): Admitting: Cardiology

## 2023-11-21 ENCOUNTER — Encounter (HOSPITAL_BASED_OUTPATIENT_CLINIC_OR_DEPARTMENT_OTHER): Payer: Self-pay | Admitting: Cardiology

## 2023-11-21 VITALS — BP 144/72 | HR 80 | Resp 16 | Ht <= 58 in | Wt 167.0 lb

## 2023-11-21 DIAGNOSIS — Z8774 Personal history of (corrected) congenital malformations of heart and circulatory system: Secondary | ICD-10-CM

## 2023-11-21 DIAGNOSIS — Z952 Presence of prosthetic heart valve: Secondary | ICD-10-CM | POA: Diagnosis not present

## 2023-11-21 DIAGNOSIS — I10 Essential (primary) hypertension: Secondary | ICD-10-CM | POA: Diagnosis not present

## 2023-11-21 DIAGNOSIS — Q2381 Bicuspid aortic valve: Secondary | ICD-10-CM

## 2023-11-21 DIAGNOSIS — I5032 Chronic diastolic (congestive) heart failure: Secondary | ICD-10-CM | POA: Diagnosis not present

## 2023-11-21 DIAGNOSIS — E785 Hyperlipidemia, unspecified: Secondary | ICD-10-CM

## 2023-11-21 MED ORDER — LISINOPRIL 10 MG PO TABS
ORAL_TABLET | ORAL | 3 refills | Status: AC
Start: 2023-11-21 — End: ?

## 2023-11-21 MED ORDER — HYDROCHLOROTHIAZIDE 25 MG PO TABS: ORAL_TABLET | ORAL | 3 refills | Status: AC

## 2023-11-21 NOTE — Patient Instructions (Signed)
 Medication Instructions:   Your physician recommends that you continue on your current medications as directed. Please refer to the Current Medication list given to you today.   *If you need a refill on your cardiac medications before your next appointment, please call your pharmacy*  Lab Work:  None ordered.   If you have labs (blood work) drawn today and your tests are completely normal, you will receive your results only by: MyChart Message (if you have MyChart) OR A paper copy in the mail If you have any lab test that is abnormal or we need to change your treatment, we will call you to review the results.  Testing/Procedures:  Your physician has requested that you have an echocardiogram. Echocardiography is a painless test that uses sound waves to create images of your heart. It provides your doctor with information about the size and shape of your heart and how well your heart's chambers and valves are working. This procedure takes approximately one hour. There are no restrictions for this procedure. Please do NOT wear cologne, perfume or lotions (deodorant is allowed). Please arrive 15 minutes prior to your appointment time.   Follow-Up: At [facility], you and your health needs are our priority.  As part of our continuing mission to provide you with exceptional heart care, our providers are all part of one team.  This team includes your primary Cardiologist (physician) and Advanced Practice Providers or APPs (Physician Assistants and Nurse Practitioners) who all work together to provide you with the care you need, when you need it.  Your next appointment:   6 month(s)  Provider:   Shelda Bruckner, MD, Rosaline Bane, NP, or Reche Finder, NP    We recommend signing up for the patient portal called MyChart.  Sign up information is provided on this After Visit Summary.  MyChart is used to connect with patients for Virtual Visits (Telemedicine).  Patients are  able to view lab/test results, encounter notes, upcoming appointments, etc.  Non-urgent messages can be sent to your provider as well.   To learn more about what you can do with MyChart, go to [email or web address].   Other Instructions  Your physician wants you to follow-up in: 6 months.  You will receive a reminder letter in the mail two months in advance. If you don't receive a letter, please call our office to schedule the follow-up appointment.

## 2023-11-21 NOTE — Progress Notes (Signed)
 Cardiology Office Note:  .   Date:  12/02/2023  ID:  [name], DOB [birth year], MRN [id number] PCP: Lafe Lauraine HAS  [facility] Providers Cardiologist:  None {  History of Present Illness: Ashley Torres   Ashley Torres is a 68 y.o. female with PMH congenital deafness, bicuspid aortic valve s/p bioprosthetic AVR 2017, coarctation of the aorta s/p childhood repair, hypertension, hyperlipidemia, chronic LE edema. She was previously followed by Dr. Maranda and Dr. Hobart, and she established care with me on 11/21/23.  Pertinent CV history: Coarctation repair age 45. Severe AS treated at [facility] [place] . Normal coronaries on cath in 2016. Underwent annulus enlargement and placement of 19 mm CE Magna bovine pericardial aortic valve 04/17/2015. TTE 08/2017 showed moderate focal BSH of the septum, EF 60-65%, grade 1 DD, high VFP, s/p AVR with mildly elevated mean gradient of 21 mmHg; no AI; trace TR with mild pulmonary hypertension; elevated velocity in descending aorta (2.7 m/s); suggested CTA or MRA to further assess coarctation. CT scan 09/2017 showed some expected narrowing of the distal arch and proximal descending thoracic aorta at the level of prior coarctation repair. 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 This can be followed in the future by CTA or MRA. Elevated aortic velocity may be secondary to the underlying smoothly tapered narrowing in combination with significant tortuosity present. The lack of significant collateral arteries may argue against a hemodynamically significant recurrent stenosis at the level of prior repair.   Has history of statin intolerance, on PCSK9I.  Today: Here with in person sign language interpreter.   Gets occasional lightheadedness, no clear triggers. Pulse is fine during these events. Rare shortness of breath with allergies, has inhaler for this.  Leg swelling has been  stable.  She was concerned as her eye doctor told her she had heart failure. We reviewed the different types of heart failure and reviewed her test history.   ROS: Denies chest pain, shortness of breath with normal exertion. No PND, orthopnea, worsening LE edema or unexpected weight gain. No syncope or palpitations. ROS otherwise negative except as noted.   Studies Reviewed: Ashley Torres    EKG:  EKG Interpretation Date/Time:  Friday November 21 2023 09:59:47 EDT Ventricular Rate:  80 PR Interval:  172 QRS Duration:  122 QT Interval:  400 QTC Calculation: 461 R Axis:   -48  Text Interpretation: Normal sinus rhythm Right bundle branch block Left anterior fascicular block Bifascicular block Minimal voltage criteria for LVH, may be normal variant Confirmed by Lonni Slain [phone]) on 11/21/2023 10:42:03 AM    Physical Exam:   VS:  BP (!) 144/72 (BP Location: Right Arm, Patient Position: Sitting, Cuff Size: Large)   Pulse 80   Resp 16   Ht 4' 7 (1.397 m)   Wt 167 lb (75.8 kg)   LMP  (LMP Unknown)   BMI 38.81 kg/m    Wt Readings from Last 3 Encounters:  11/21/23 167 lb (75.8 kg)  09/02/23 167 lb 6.4 oz (75.9 kg)  03/21/23 170 lb 4 oz (77.2 kg)    Initially left arm 172/72, R arm 144/72  GEN: Well nourished, well developed in no acute distress HEENT: Normal, moist mucous membranes NECK: No JVD CARDIAC: regular rhythm, normal S1 and S2, no rubs or gallops. 3/6 systolic murmur, early peaking. VASCULAR: Radial and DP pulses 2+ bilaterally. No carotid bruits RESPIRATORY:  Clear to auscultation without rales, wheezing or  rhonchi  ABDOMEN: Soft, non-tender, non-distended MUSCULOSKELETAL:  Ambulates independently SKIN: Warm and dry, bilateral trivial LE edema with sock lines NEUROLOGIC:  Congenitally deaf PSYCHIATRIC:  Normal affect    ASSESSMENT AND PLAN: .    Severe aortic stenosis 2/2 bicuspid aortic valve s/p bioprosthetic AVR 2017 -19 mm CE Magna bovine pericardial  valve -gradients have been chronically elevated, likely 2/2 size of valve and partial fusion of leaflets -last echo 09/2022 showed moderately calcified bioprosthetic aortic valve with partial fusion of RCC-NCC. Mean gradient 20, AVA 0.56, DI 0.29. Stroke volume low so gradient likely underestimated -given size of valve, suspect she will not be able to have TAVR in valve and would need redo AVR -continue aspirin  81 mg daily -continue dental prophylaxis, she is aware and has script for upcoming dental visit in October  Coarctation of the aorta s/p repair, with residual coarctation -has been monitored with CTA q2 years, last 03/2022 was stable -she doesn't like getting the IV dye. We discussed MRA as alternative. She does not have any metal implants in her body. She had an MRI at [facility] a long time ago, they gave her sedation, but she think she could tolerate a short time in the scanner. I will ask my colleague how long they would expect her to have to be in the scanner  Chronic diastolic heart failure Chronic LE edema Hypertension -continue hydrochlorothiazide  25 mg daily (has allergy to lasix ) -continue lisinopril  10 mg daily -significantly helped by elevation, suspect component of venous insufficiency. Compression stockings are painful for her  Hyperlipidemia Statin intolerance -continue PCSK9i -most recent lipids 09/02/23 from [facility]: Tchol 142, HDL 53, LDL 60, TG 172  RBBB LAFB -stable, no high risk symptoms  CV risk counseling and prevention -recommend heart healthy/Mediterranean diet, with whole grains, fruits, vegetable, fish, lean meats, nuts, and olive oil. Limit salt. -recommend moderate walking, 3-5 times/week for 30-50 minutes each session. Aim for at least 150 minutes/week. Goal should be pace of 3 miles/hours, or walking 1.5 miles in 30 minutes -recommend avoidance of tobacco products. Avoid excess alcohol. -ASCVD risk score: The 10-year ASCVD risk score (Arnett DK, et al., 2019)  is: 8%   Values used to calculate the score:     Age: 68 years     Clincally relevant sex: Female     Is Non-Hispanic African American: No     Diabetic: No     Tobacco smoker: No     Systolic Blood Pressure: 144 mmHg     Is BP treated: Yes     HDL Cholesterol: 53 mg/dL     Total Cholesterol: 142 mg/dL    Dispo: 6 months or sooner as needed  Signed, Shelda Bruckner, MD   Shelda Bruckner, MD, PhD, [facility]  [facility]  [place]  [facility] at [facility] at [facility] [address], [address] [place], [place] [address] [phone]

## 2023-12-16 ENCOUNTER — Other Ambulatory Visit (HOSPITAL_BASED_OUTPATIENT_CLINIC_OR_DEPARTMENT_OTHER)

## 2023-12-19 ENCOUNTER — Other Ambulatory Visit (HOSPITAL_BASED_OUTPATIENT_CLINIC_OR_DEPARTMENT_OTHER)

## 2023-12-30 ENCOUNTER — Telehealth: Payer: Self-pay

## 2023-12-30 NOTE — Telephone Encounter (Signed)
 Saddie with [employer] calls nurse line in regards to order.   She reports they received (2) signed orders from PCP (signed by [place] pecos enrolled.) She reports one signed order was to move forward with test and the other order was a denial.   The test kit was sent to the patients home already. However, after receiving the denial a few days later they are unsure how to proceed.  The test is [employer]. She reports it shows how the body metabolizes her medications.   She reports the patients insurance will cover the test.   Advised will forward to PCP.

## 2023-12-30 NOTE — Telephone Encounter (Signed)
 Called Ashley Torres at [facility].   Test will be discontinued per providers request.   Saddie reports she will let the patient know.

## 2024-01-14 ENCOUNTER — Ambulatory Visit (INDEPENDENT_AMBULATORY_CARE_PROVIDER_SITE_OTHER)

## 2024-01-14 ENCOUNTER — Telehealth (HOSPITAL_COMMUNITY): Payer: Self-pay | Admitting: Licensed Clinical Social Worker

## 2024-01-14 DIAGNOSIS — I5032 Chronic diastolic (congestive) heart failure: Secondary | ICD-10-CM

## 2024-01-14 DIAGNOSIS — Z952 Presence of prosthetic heart valve: Secondary | ICD-10-CM | POA: Diagnosis not present

## 2024-01-14 NOTE — Telephone Encounter (Signed)
 H&V Care Navigation CSW Progress Note  Clinical Social Worker informed that pt came by insurance transportation and was told their system was down so they couldn't take her back home for 2-3 hours.  CSW arranged taxi to pick up patient from clinic  Andriette HILARIO Leech, LCSW Clinical Social Worker [facility] Desk#: [phone] Cell#: [phone]

## 2024-01-15 LAB — ECHOCARDIOGRAM COMPLETE
AV Mean grad: 14 mmHg
AV Peak grad: 25 mmHg
Ao pk vel: 2.5 m/s
S' Lateral: 2.11 cm

## 2024-02-04 ENCOUNTER — Ambulatory Visit (HOSPITAL_BASED_OUTPATIENT_CLINIC_OR_DEPARTMENT_OTHER): Payer: Self-pay | Admitting: Cardiology

## 2024-02-09 NOTE — Telephone Encounter (Signed)
Pt calling in for results

## 2024-04-07 ENCOUNTER — Other Ambulatory Visit: Payer: Self-pay | Admitting: Family Medicine

## 2024-04-07 DIAGNOSIS — Z1231 Encounter for screening mammogram for malignant neoplasm of breast: Secondary | ICD-10-CM

## 2024-04-19 ENCOUNTER — Other Ambulatory Visit: Payer: Self-pay | Admitting: Family Medicine

## 2024-04-19 DIAGNOSIS — E785 Hyperlipidemia, unspecified: Secondary | ICD-10-CM

## 2024-04-19 DIAGNOSIS — Q2381 Bicuspid aortic valve: Secondary | ICD-10-CM

## 2024-04-19 DIAGNOSIS — Z952 Presence of prosthetic heart valve: Secondary | ICD-10-CM

## 2024-04-19 DIAGNOSIS — I5032 Chronic diastolic (congestive) heart failure: Secondary | ICD-10-CM

## 2024-04-19 DIAGNOSIS — E039 Hypothyroidism, unspecified: Secondary | ICD-10-CM

## 2024-04-19 DIAGNOSIS — Z8774 Personal history of (corrected) congenital malformations of heart and circulatory system: Secondary | ICD-10-CM

## 2024-04-19 DIAGNOSIS — I10 Essential (primary) hypertension: Secondary | ICD-10-CM

## 2024-04-21 ENCOUNTER — Ambulatory Visit: Payer: Self-pay | Admitting: Family Medicine

## 2024-04-26 ENCOUNTER — Ambulatory Visit: Admitting: Family Medicine

## 2024-05-31 ENCOUNTER — Ambulatory Visit
